# Patient Record
Sex: Male | Born: 1955 | Race: Black or African American | Hispanic: No | Marital: Married | Smoking: Former smoker
Health system: Southern US, Community
[De-identification: ages and names within clinical notes are randomized; demographics above are authoritative.]

## PROBLEM LIST (undated history)

## (undated) DIAGNOSIS — J209 Acute bronchitis, unspecified: Secondary | ICD-10-CM

## (undated) DIAGNOSIS — J441 Chronic obstructive pulmonary disease with (acute) exacerbation: Secondary | ICD-10-CM

## (undated) DIAGNOSIS — E119 Type 2 diabetes mellitus without complications: Secondary | ICD-10-CM

## (undated) DIAGNOSIS — J449 Chronic obstructive pulmonary disease, unspecified: Secondary | ICD-10-CM

## (undated) DIAGNOSIS — C61 Malignant neoplasm of prostate: Secondary | ICD-10-CM

## (undated) DIAGNOSIS — J440 Chronic obstructive pulmonary disease with acute lower respiratory infection: Secondary | ICD-10-CM

## (undated) DIAGNOSIS — D72829 Elevated white blood cell count, unspecified: Secondary | ICD-10-CM

## (undated) DIAGNOSIS — J9621 Acute and chronic respiratory failure with hypoxia: Secondary | ICD-10-CM

## (undated) DIAGNOSIS — J9601 Acute respiratory failure with hypoxia: Secondary | ICD-10-CM

## (undated) HISTORY — PX: TESTICLE SURGERY: SHX794

## (undated) HISTORY — DX: Acute respiratory failure with hypoxia: J96.01

## (undated) HISTORY — DX: Chronic obstructive pulmonary disease with (acute) exacerbation: J44.1

## (undated) HISTORY — DX: Acute and chronic respiratory failure with hypoxia: J96.21

## (undated) HISTORY — DX: Chronic obstructive pulmonary disease with (acute) lower respiratory infection: J44.0

## (undated) HISTORY — DX: Elevated white blood cell count, unspecified: D72.829

## (undated) HISTORY — DX: Acute bronchitis, unspecified: J20.9

---

## 2004-02-19 ENCOUNTER — Other Ambulatory Visit: Payer: Self-pay

## 2004-05-12 ENCOUNTER — Other Ambulatory Visit: Payer: Self-pay

## 2004-10-27 ENCOUNTER — Emergency Department: Payer: Self-pay | Admitting: Emergency Medicine

## 2004-11-16 ENCOUNTER — Emergency Department: Payer: Self-pay | Admitting: General Practice

## 2004-12-12 ENCOUNTER — Emergency Department: Payer: Self-pay | Admitting: General Practice

## 2004-12-22 ENCOUNTER — Emergency Department (HOSPITAL_COMMUNITY): Admission: EM | Admit: 2004-12-22 | Discharge: 2004-12-22 | Payer: Self-pay

## 2005-02-13 ENCOUNTER — Emergency Department: Payer: Self-pay | Admitting: Internal Medicine

## 2005-02-13 ENCOUNTER — Other Ambulatory Visit: Payer: Self-pay

## 2005-02-23 ENCOUNTER — Inpatient Hospital Stay: Payer: Self-pay | Admitting: Internal Medicine

## 2005-03-07 ENCOUNTER — Emergency Department: Payer: Self-pay | Admitting: Internal Medicine

## 2005-04-10 ENCOUNTER — Emergency Department: Payer: Self-pay | Admitting: Emergency Medicine

## 2005-05-08 ENCOUNTER — Emergency Department: Payer: Self-pay | Admitting: Emergency Medicine

## 2005-05-08 ENCOUNTER — Other Ambulatory Visit: Payer: Self-pay

## 2005-10-05 ENCOUNTER — Inpatient Hospital Stay: Payer: Self-pay | Admitting: Internal Medicine

## 2005-10-05 ENCOUNTER — Other Ambulatory Visit: Payer: Self-pay

## 2013-05-25 ENCOUNTER — Inpatient Hospital Stay: Payer: Self-pay | Admitting: Internal Medicine

## 2013-05-25 LAB — CBC
HCT: 46.6 % (ref 40.0–52.0)
HGB: 15.3 g/dL (ref 13.0–18.0)
MCH: 26.4 pg (ref 26.0–34.0)
MCHC: 32.8 g/dL (ref 32.0–36.0)
Platelet: 355 10*3/uL (ref 150–440)
RBC: 5.79 10*6/uL (ref 4.40–5.90)
RDW: 15.1 % — ABNORMAL HIGH (ref 11.5–14.5)
WBC: 11.3 10*3/uL — ABNORMAL HIGH (ref 3.8–10.6)

## 2013-05-25 LAB — COMPREHENSIVE METABOLIC PANEL
Albumin: 3.8 g/dL (ref 3.4–5.0)
Anion Gap: 11 (ref 7–16)
Calcium, Total: 9.3 mg/dL (ref 8.5–10.1)
Glucose: 105 mg/dL — ABNORMAL HIGH (ref 65–99)
Osmolality: 285 (ref 275–301)
Sodium: 142 mmol/L (ref 136–145)
Total Protein: 7.7 g/dL (ref 6.4–8.2)

## 2013-05-25 LAB — PRO B NATRIURETIC PEPTIDE: B-Type Natriuretic Peptide: 35 pg/mL (ref 0–125)

## 2013-05-25 LAB — CK TOTAL AND CKMB (NOT AT ARMC): CK-MB: 2 ng/mL (ref 0.5–3.6)

## 2015-04-03 NOTE — H&P (Signed)
PATIENT NAME:  Ernest [name], Ernest [name] MR#:  [id number] DATE OF BIRTH:  January 13, 1956  DATE OF ADMISSION:  05/25/2013  PRIMARY CARE PHYSICIAN: None. The patient is from out of town, [place], [place].   CHIEF COMPLAINT: Shortness of breath and cough for 3 days.   HISTORY OF PRESENT ILLNESS: Ernest [name] is a 59 year old African-American gentleman with history of COPD, ongoing heavy tobacco abuse, history of anxiety and hypertension, who is not currently on any medication due to financial constraints, who comes to the Emergency Room after he started having increasing cough with minimal sputum and shortness of breath. He came to the Emergency Room. He was very short of breath, was not able to complete a sentence, and was placed immediately on the BiPAP. During my evaluation, the patient appears to be more settled down, and his sat is 100% on current BiPAP setting. He is able to complete a sentence without any difficulty at this time. Denies any chest pain.   The patient says he recently relocated back from [place], [place], and is currently living in a hotel with his wife and 4 dogs. Denies any chest pain or any fever.   PAST MEDICAL HISTORY:  1. COPD. 2. Ongoing heavy tobacco abuse.  3. Anxiety.  4. History of hypertension.   CURRENT MEDICATIONS:  1. Symbicort inhaler.  2. Brovana nebulizer treatment.  3. He is supposed to be on clonazepam, but the patient has run out of it.  4. The patient says he also takes some antidepressants.   FAMILY HISTORY: CVA in father, who died when the patient was young. The patient's mother has breast cancer and diabetes.   SOCIAL HISTORY: He smokes about a pack a day. Recently relocated from [place], [place]. Does not drink alcohol. He is on disability.   REVIEW OF SYSTEMS:  CONSTITUTIONAL: No fever. Positive for fatigue, weakness.  EYES: No blurred or double vision.  ENT: No tinnitus, ear pain, hearing loss.  RESPIRATORY: Positive for shortness of breath and  cough along with COPD.  CARDIOVASCULAR: No chest pain, orthopnea or edema  GASTROINTESTINAL: No nausea, vomiting, diarrhea, abdominal pain or GERD.  GENITOURINARY: No dysuria or hematuria.  ENDOCRINE: No polyuria, nocturia or thyroid problems.  HEMATOLOGY: No anemia or easy bruising.  SKIN: No acne or rash.  MUSCULOSKELETAL: Positive for arthritis.  NEUROLOGIC: No CVA or TIAs.  PSYCHIATRIC: Positive for anxiety. No bipolar disorder or schizophrenia.   PHYSICAL EXAMINATION:  GENERAL: The patient is awake, alert, oriented x3.  VITAL SIGNS: He is afebrile, pulse is 100, blood pressure is 143/102. Repeat blood pressure is 133/77, pulse oximetry is 100%, currently being on BiPAP.  HEENT: Atraumatic. Bilateral pupils equal and reactive to light. The patient does have left eye conjunctival injection from excessive coughing. Oral mucosa is moist.  NECK: Supple. No JVD. No carotid bruit. No lymphadenopathy.  RESPIRATORY: There are distant breath sounds, decreased breath sounds at the bases. Mild rhonchi heard bilaterally.  CARDIOVASCULAR: Tachycardia present. Both the heart sounds are normal. Rhythm is regular, rate is tachycardic. No murmur heard. PMI not lateralized. Chest nontender. Good pedal pulses, good femoral pulses. No lower extremity edema.  ABDOMEN: Soft, benign, nontender. No organomegaly. Positive bowel sounds.  NEUROLOGIC: Grossly intact cranial nerves II through XII. No motor or sensory deficit.  PSYCHIATRIC: The patient is awake, alert, oriented x3.   DIAGNOSTIC STUDIES: EKG shows sinus tachycardia. Chest x-ray consistent with mild bilateral interstitial prominence. Pneumonitis or mild congestive heart failure cannot be excluded. B-type natriuretic peptide is 35. White count  is 11.3, H and H is 15.3 and 46.6, platelet count is 355. Glucose is 105, rest of the comprehensive metabolic panel within normal limits. Troponin is 0.02.   ASSESSMENT: The 59 year old [name] with history  of chronic obstructive pulmonary disease, ongoing tobacco abuse, history of anxiety, depression and  suspected hypertension, comes in with:   1. Acute respiratory failure secondary to severe chronic obstructive pulmonary disease exacerbation. The patient was kept on BiPAP during the ER stay. He was unable to complete a sentence without getting very short of breath. His saturations were 96% on room air on arrival with elevated blood pressure. The patient did settle down during my evaluation, remained on BiPAP with 100% saturations. Will admit the patient to the medical floor and continue IV Solu-Medrol around the clock along with give empirically Z-Pak, nebulizer treatments and oral inhalers.  2. Ongoing tobacco abuse, heavy. The patient was advised on smoking cessation. He is also recommended to take his inhalers on a routine basis. About 3 minutes spent for smoking cessation counseling.  3. Anxiety and depression. Will start the patient back on his clonazepam.   4. Deep vein thrombosis prophylaxis with subcutaneous heparin.  5. Suspected high blood pressure. Start the patient on antihypertensives if his average blood pressure stays on the higher side.  6. Further workup according to the patient's clinical course.   Hospital admission plan was discussed with the patient.   CRITICAL TIME SPENT: 55 minutes.   ____________________________ Hart Rochester Posey Pronto, MD sap:OSi D: 05/25/2013 13:54:44 ET T: 05/25/2013 14:14:06 ET JOB#: [id number]  cc: Shun Pletz A. Posey Pronto, MD, <Dictator> Ilda Basset MD ELECTRONICALLY SIGNED 05/25/2013 14:36

## 2015-04-03 NOTE — Discharge Summary (Signed)
PATIENT NAME:  Ernest [name], Ernest [name] MR#:  [id number] DATE OF BIRTH:  [birth year]  DATE OF ADMISSION:  05/25/2013  DATE OF DISCHARGE:  05/29/2013  PRIMARY CARE PHYSICIAN:  Non-local.  DISCHARGE DIAGNOSES: 1.  Acute respiratory failure.  2.  Chronic obstructive pulmonary disease exacerbation.  3.  Tobacco abuse.  4.  Anxiety.   CONDITION:  Stable.   CODE STATUS:  Full code.   MEDICATIONS:  Symbicort 160 mcg/4.5 mcg inhalation aerosol 2 puffs b.i.d. Nicotine patch   21 mg 24 hours transdermal 1 patch transdermal once a day. Spiriva 18 mcg capsule 1 cap once a day. Prednisone 40 mg p.o. daily, then taper. Combivent CFC free 100 mcg/200 mcg inhalation aerosol 1 puff 4 times a day p.r.n. for shortness of breath.   OXYGEN:  The patient needs home oxygen p.r.n. at night.   DIET:  Regular diet.   ACTIVITY:  As tolerated.   FOLLOWUP CARE:  Follow up with PCP within one week. Also patient wants pulmonary physician followup. He needs followup with Dr. Raul Del within 1 to 2 weeks.   REASON FOR ADMISSION:  Shortness of breath and cough for 3 days.   HOSPITAL COURSE: The patient is a 59 year old, African-American male with a history of COPD, ongoing tobacco abuse, hypertension. Presented to the ED with shortness of breath, cough for 3 days. The patient is currently not on any medication due to financial issue. The patient has just moved to this area several days ago. The patient was noted to have respiratory failure, was placed on BiPAP in the ED, and admitted for COPD exacerbation. For detailed history and physical examination, please refer to the admission note dictated by Dr. Fritzi Mandes.   1.  Acute respiratory failure with severe COPD exacerbation. The patient was on BiPAP for the first 2 days and then he has been treated with Solu-Medrol, nebulizer, Advair, Spiriva since admission. The patient was off BiPAP 2 days ago and placed on oxygen by nasal cannula, 3 to 4 liters. The patient's symptoms have much  improved since yesterday. He has only mild shortness of breath and cough. physical examination showed bilateral air entry. No wheezing or rales. No crackles. He is off oxygen since this morning, without shortness of breath. O2 saturation is 95% without oxygen.   2.  For tobacco abuse, patient was counseled for smoking cessation, was treated with a nicotine patch.   The patient's symptoms have much improved. He is clinically stable, will be discharged to home today. I discussed the patient's discharge plan with the patient, case manager and nurse.   TIME SPENT:  About 38 minutes.    ____________________________ Demetrios Loll, MD qc:mr D: 05/29/2013 14:08:00 ET T: 05/29/2013 20:18:52 ET JOB#: [id number]  cc: Demetrios Loll, MD, <Dictator> Demetrios Loll MD ELECTRONICALLY SIGNED 05/31/2013 13:47

## 2015-12-22 ENCOUNTER — Emergency Department
Admission: EM | Admit: 2015-12-22 | Discharge: 2015-12-22 | Disposition: A | Discharge status: Home / Self Care | Payer: Medicare PPO | Attending: Emergency Medicine | Admitting: Emergency Medicine

## 2015-12-22 ENCOUNTER — Encounter: Payer: Self-pay | Admitting: *Deleted

## 2015-12-22 DIAGNOSIS — F17200 Nicotine dependence, unspecified, uncomplicated: Secondary | ICD-10-CM | POA: Diagnosis not present

## 2015-12-22 DIAGNOSIS — L723 Sebaceous cyst: Secondary | ICD-10-CM | POA: Diagnosis not present

## 2015-12-22 DIAGNOSIS — L02212 Cutaneous abscess of back [any part, except buttock]: Secondary | ICD-10-CM | POA: Diagnosis present

## 2015-12-22 DIAGNOSIS — Z79899 Other long term (current) drug therapy: Secondary | ICD-10-CM | POA: Insufficient documentation

## 2015-12-22 DIAGNOSIS — L089 Local infection of the skin and subcutaneous tissue, unspecified: Secondary | ICD-10-CM

## 2015-12-22 HISTORY — DX: Chronic obstructive pulmonary disease, unspecified: J44.9

## 2015-12-22 MED ORDER — LIDOCAINE-EPINEPHRINE (PF) 1 %-1:200000 IJ SOLN
INTRAMUSCULAR | Status: AC
  Filled 2015-12-22: qty 30

## 2015-12-22 MED ORDER — SULFAMETHOXAZOLE-TRIMETHOPRIM 800-160 MG PO TABS: 2.0000 | ORAL_TABLET | Freq: Two times a day (BID) | ORAL | Status: AC

## 2015-12-22 MED ORDER — LIDOCAINE-EPINEPHRINE 2 %-1:100000 IJ SOLN
30.0000 mL | Freq: Once | INTRAMUSCULAR | Status: DC
  Filled 2015-12-22: qty 30

## 2015-12-22 NOTE — ED Notes (Signed)
Has large red raised area to upper back  States increased pain for the past 3 days

## 2015-12-22 NOTE — ED Notes (Signed)
Pt states abcsess to upper back, states noticed it 3 days ago

## 2015-12-22 NOTE — ED Provider Notes (Signed)
CSN: [id number]     Arrival date & time 12/22/15  1202 History   First MD Initiated Contact with Patient 12/22/15 1255     Chief Complaint  Patient presents with  . Abscess     HPI Comments: 60 year old male presents today complaining of abscess to upper back. Reports that he has had a cyst there for 8-9 months. About 3 days ago it started to become red and painful. No drainage from the area. No history of MRSA.   The history is provided by the patient.    Past Medical History  Diagnosis Date  . COPD (chronic obstructive pulmonary disease) ([place])    History reviewed. No pertinent past surgical history. History reviewed. No pertinent family history. Social History  Substance Use Topics  . Smoking status: Current Every Day Smoker  . Smokeless tobacco: None  . Alcohol Use: No    Review of Systems  Skin: Positive for wound.  All other systems reviewed and are negative.     Allergies  Review of patient's allergies indicates no known allergies.  Home Medications   Prior to Admission medications   Medication Sig Start Date End Date Taking? Authorizing Provider  albuterol (ACCUNEB) 1.25 MG/3ML nebulizer solution Take 1 ampule by nebulization every 6 (six) hours as needed for wheezing.   Yes Historical Provider, MD  albuterol (PROVENTIL HFA;VENTOLIN HFA) 108 (90 Base) MCG/ACT inhaler Inhale 2 puffs into the lungs every 6 (six) hours as needed for wheezing or shortness of breath.   Yes Historical Provider, MD  tiotropium (SPIRIVA) 18 MCG inhalation capsule Place 18 mcg into inhaler and inhale daily.   Yes Historical Provider, MD  sulfamethoxazole-trimethoprim (BACTRIM DS) 800-160 MG tablet Take 2 tablets by mouth 2 (two) times daily. 12/22/15 01/01/16  Angelica Ran V, PA-C   BP 142/92 mmHg  Pulse 81  Temp(Src) 98.2 F (36.8 C) (Oral)  Resp 18  Ht 6\' 3"  (1.905 m)  Wt 96.163 kg  BMI 26.50 kg/m2  SpO2 98% Physical Exam  Constitutional: He is oriented to person, place, and  time. He appears well-developed and well-nourished.  HENT:  Head: Normocephalic and atraumatic.  Neurological: He is alert and oriented to person, place, and time.  Skin: Skin is warm and dry. There is erythema.     Psychiatric: He has a normal mood and affect. His behavior is normal. Judgment and thought content normal.  Nursing note and vitals reviewed.   ED Course  .Marland KitchenIncision and Drainage Date/Time: 12/22/2015 4:06 PM Performed by: Harvest Dark Authorized by: Harvest Dark Consent: Verbal consent obtained. Written consent obtained. Risks and benefits: risks, benefits and alternatives were discussed Consent given by: patient Patient understanding: patient states understanding of the procedure being performed Patient consent: the patient's understanding of the procedure matches consent given Procedure consent: procedure consent matches procedure scheduled Relevant documents: relevant documents present and verified Test results: test results available and properly labeled Site marked: the operative site was marked Required items: required blood products, implants, devices, and special equipment available Patient identity confirmed: verbally with patient Time out: Immediately prior to procedure a "time out" was called to verify the correct patient, procedure, equipment, support staff and site/side marked as required. Type: cyst Body area: trunk Location details: back Anesthesia: local infiltration Local anesthetic: lidocaine 1% with epinephrine Anesthetic total: 4 ml Patient sedated: no Scalpel size: 11 Incision type: single straight Incision depth: dermal Complexity: simple Drainage: purulent and  serosanguinous Drainage amount: copious Wound treatment: drain placed  Packing material: 1/4 in iodoform gauze Patient tolerance: Patient tolerated the procedure well with no immediate complications   (including critical care time) Labs Review Labs Reviewed - No data to  display  Imaging Review No results found. I have personally reviewed and evaluated these images and lab results as part of my medical decision-making.   EKG Interpretation None      MDM  Bactrim DS x 10 days  Warm compresses to area Wash with soap and water Return in 2 days for wound check  Final diagnoses:  Infected sebaceous cyst      Harvest Dark, PA-C 12/22/15 1608  Harvest Dark, MD 12/23/15 1001

## 2015-12-22 NOTE — Discharge Instructions (Signed)
Sebaceous Cyst Removal Sebaceous cyst removal is a procedure to remove a sac of oily material that forms under your skin (sebaceous cyst). Sebaceous cysts may also be called epidermoid cysts or keratin cysts. Normally, the skin secretes this oily material through a gland or a hair follicle. This type of cyst usually results when a skin gland or hair follicle becomes blocked. You may need this procedure if you have a sebaceous cyst that becomes large, uncomfortable, or infected. LET YOUR HEALTH CARE PROVIDER KNOW ABOUT:  Any allergies you have.  All medicines you are taking, including vitamins, herbs, eye drops, creams, and over-the-counter medicines.  Previous problems you or members of your family have had with the use of anesthetics.  Any blood disorders you have.  Previous surgeries you have had.  Medical conditions you have. RISKS AND COMPLICATIONS Generally, this is a safe procedure. However, problems may occur, including:  Developing another cyst.  Bleeding.  Infection.  Scarring. BEFORE THE PROCEDURE  Ask your health care provider about:  Changing or stopping your regular medicines. This is especially important if you are taking diabetes medicines or blood thinners.  Taking medicines such as aspirin and ibuprofen. These medicines can thin your blood. Do not take these medicines before your procedure if your health care provider instructs you not to.  If you have an infected cyst, you may have to take antibiotic medicines before or after the cyst removal. Take your antibiotics as directed by your health care provider. Finish all of the medicine even if you start to feel better.  Take a shower on the morning of your procedure. Your health care provider may ask you to use a germ-killing (antiseptic) soap. PROCEDURE  You will be given a medicine that numbs the area (local anesthetic).  The skin around the cyst will be cleaned with a germ-killing solution  (antiseptic).  Your health care provider will make a small surgical incision over the cyst.  The cyst will be separated from the surrounding tissues that are under your skin.  If possible, the cyst will be removed undamaged (intact).  If the cyst bursts (ruptures), it will need to be removed in pieces.  After the cyst is removed, your health care provider will control any bleeding and close the incision with small stitches (sutures). Small incisions may not need sutures, and the bleeding will be controlled by applying direct pressure with gauze.  Your health care provider may apply antibiotic ointment and a light bandage (dressing) over the incision. This procedure may vary among health care providers and hospitals. AFTER THE PROCEDURE  If your cyst ruptured during surgery, you may need to take antibiotic medicine. If you were prescribed an antibiotic medicine, finish all of it even if you start to feel better.   This information is not intended to replace advice given to you by your health care provider. Make sure you discuss any questions you have with your health care provider.   Document Released: 11/25/2000 Document Revised: 12/19/2014 Document Reviewed: 08/13/2014 Elsevier Interactive Patient Education 2016 [employer]  

## 2015-12-24 ENCOUNTER — Emergency Department
Admission: EM | Admit: 2015-12-24 | Discharge: 2015-12-24 | Disposition: A | Discharge status: Home / Self Care | Payer: Medicare PPO | Attending: Emergency Medicine | Admitting: Emergency Medicine

## 2015-12-24 DIAGNOSIS — Z79899 Other long term (current) drug therapy: Secondary | ICD-10-CM | POA: Insufficient documentation

## 2015-12-24 DIAGNOSIS — Z4801 Encounter for change or removal of surgical wound dressing: Secondary | ICD-10-CM | POA: Insufficient documentation

## 2015-12-24 DIAGNOSIS — Z5189 Encounter for other specified aftercare: Secondary | ICD-10-CM

## 2015-12-24 DIAGNOSIS — J441 Chronic obstructive pulmonary disease with (acute) exacerbation: Secondary | ICD-10-CM | POA: Insufficient documentation

## 2015-12-24 DIAGNOSIS — F17200 Nicotine dependence, unspecified, uncomplicated: Secondary | ICD-10-CM | POA: Diagnosis not present

## 2015-12-24 DIAGNOSIS — Z7951 Long term (current) use of inhaled steroids: Secondary | ICD-10-CM | POA: Insufficient documentation

## 2015-12-24 DIAGNOSIS — J449 Chronic obstructive pulmonary disease, unspecified: Secondary | ICD-10-CM

## 2015-12-24 MED ORDER — IPRATROPIUM-ALBUTEROL 0.5-2.5 (3) MG/3ML IN SOLN
3.0000 mL | Freq: Once | RESPIRATORY_TRACT | Status: AC
  Administered 2015-12-24: 3 mL via RESPIRATORY_TRACT
  Filled 2015-12-24: qty 3

## 2015-12-24 MED ORDER — DEXAMETHASONE SODIUM PHOSPHATE 10 MG/ML IJ SOLN
20.0000 mg | Freq: Once | INTRAMUSCULAR | Status: AC
  Administered 2015-12-24: 20 mg via INTRAMUSCULAR
  Filled 2015-12-24: qty 2

## 2015-12-24 NOTE — Discharge Instructions (Signed)

## 2015-12-24 NOTE — ED Notes (Signed)
Assessment per PA 

## 2015-12-24 NOTE — ED Provider Notes (Signed)
[facility] Emergency Department Provider Note  ____________________________________________  Time seen: Approximately 10:38 AM  I have reviewed the triage vital signs and the nursing notes.   HISTORY  Chief Complaint Wound Check    HPI Ernest [name] is a 60 y.o. male who presents to the emergency department for a wound recheck for his abscess. Patient was seen in this department 2 days prior and incision and drainage was undertaken. She states that area is relatively pain-free, not growing in size, decreasing redness and swelling.  Talking with patient patient is in mild respiratory distress. Patient endorses a history of COPD. He states that he is on 3 nebulizer treatments and 3 inhalers for symptom control. He is recently ran out of his prescribed Symbicort. He states that medication is too expensive for him to obtain and he has stopped use. Patient endorses mild shortness of breath but states "I'm okay."   Past Medical History  Diagnosis Date  . COPD (chronic obstructive pulmonary disease) ([facility])     There are no active problems to display for this patient.   No past surgical history on file.  Current Outpatient Rx  Name  Route  Sig  Dispense  Refill  . albuterol (ACCUNEB) 1.25 MG/3ML nebulizer solution   Nebulization   Take 1 ampule by nebulization every 6 (six) hours as needed for wheezing.         Marland Kitchen albuterol (PROVENTIL HFA;VENTOLIN HFA) 108 (90 Base) MCG/ACT inhaler   Inhalation   Inhale 2 puffs into the lungs every 6 (six) hours as needed for wheezing or shortness of breath.         . sulfamethoxazole-trimethoprim (BACTRIM DS) 800-160 MG tablet   Oral   Take 2 tablets by mouth 2 (two) times daily.   40 tablet   0   . tiotropium (SPIRIVA) 18 MCG inhalation capsule   Inhalation   Place 18 mcg into inhaler and inhale daily.           Allergies Review of patient's allergies indicates no known allergies.  No family history on  file.  Social History Social History  Substance Use Topics  . Smoking status: Current Every Day Smoker  . Smokeless tobacco: Not on file  . Alcohol Use: No     Review of Systems  Constitutional: No fever/chills Eyes: No visual changes. No discharge ENT: No sore throat. Cardiovascular: no chest pain. Respiratory: no cough. Positive SOB. Gastrointestinal: No abdominal pain.  No nausea, no vomiting.  No diarrhea.  No constipation. Genitourinary: Negative for dysuria. No hematuria Musculoskeletal: Negative for back pain. Skin: Negative for rash. Positive for incised and drained abscess. Neurological: Negative for headaches, focal weakness or numbness. 10-point ROS otherwise negative.  ____________________________________________   PHYSICAL EXAM:  VITAL SIGNS: ED Triage Vitals  Enc Vitals Group     BP 12/24/15 1029 135/87 mmHg     Pulse Rate 12/24/15 1029 86     Resp 12/24/15 1029 20     Temp 12/24/15 1029 97.8 F (36.6 C)     Temp Source 12/24/15 1029 Oral     SpO2 12/24/15 1029 96 %     Weight --      Height --      Head Cir --      Peak Flow --      Pain Score --      Pain Loc --      Pain Edu? --      Excl. in [place]? --  Constitutional: Alert and oriented. Well appearing and in no acute distress. Eyes: Conjunctivae are normal. PERRL. EOMI. Head: Atraumatic. ENT:      Ears:       Nose: No congestion/rhinnorhea.      Mouth/Throat: Mucous membranes are moist.  Neck: No stridor.   Hematological/Lymphatic/Immunilogical: No cervical lymphadenopathy. Cardiovascular: Normal rate, regular rhythm. Normal S1 and S2.  Good peripheral circulation. Respiratory: Normal respiratory effort without tachypnea or retractions. Lungs with diffuse inspiratory and end expiratory wheezing. Slightly decreased breath sounds to bilateral bases. No absent breath sounds. No rales or rhonchi. Gastrointestinal: Soft and nontender. No distention. No CVA tenderness. Musculoskeletal: No  lower extremity tenderness nor edema.  No joint effusions. Neurologic:  Normal speech and language. No gross focal neurologic deficits are appreciated.  Skin:  Skin is warm, dry and intact. No rash noted. Packed abscess is identified to posterior thorax. Packing is in place with some mild pustular drainage/bloody mixture noted. Area is firm to touch. No fluctuance. Minimal erythema to area. Psychiatric: Mood and affect are normal. Speech and behavior are normal. Patient exhibits appropriate insight and judgement.   ____________________________________________   LABS (all labs ordered are listed, but only abnormal results are displayed)  Labs Reviewed - No data to display ____________________________________________  EKG   ____________________________________________  RADIOLOGY   No results found.  ____________________________________________    PROCEDURES  Procedure(s) performed:    The wound is cleansed, packing is removed and not replaced, and dressed. The patient is alerted to watch for any signs of infection (redness, pus, pain, increased swelling or fever) and call if such occurs. Home wound care instructions are provided.    Medications  ipratropium-albuterol (DUONEB) 0.5-2.5 (3) MG/3ML nebulizer solution 3 mL (3 mLs Nebulization Given 12/24/15 1056)  dexamethasone (DECADRON) injection 20 mg (20 mg Intramuscular Given 12/24/15 1056)     ____________________________________________   INITIAL IMPRESSION / ASSESSMENT AND PLAN / ED COURSE  Pertinent labs & imaging results that were available during my care of the patient were reviewed by me and considered in my medical decision making (see chart for details).  Patient's diagnosis is consistent with a wound recheck and COPD. Patient's abscess is healing appropriately. Packing is removed and not replaced. Area is dressed. Patient is advised to continue on his antibiotics and to keep the area covered and clean. He will  follow-up with the emergency department or primary care for any worsening or return of symptoms.  Patient has a history of significant COPD. He has recently ran out of home medications. Patient was in moderate respiratory distress here in the emergency department. Lungs were auscultated with significant wheezing. Mildly decreased air entry into the bases upon normal respirations. Patient was provided with DuoNeb treatment as well as Decadron injection here in the emergency department. After administration of both medications patient's symptoms improved. Lungs were re-auscultated and revealed an improvement in wheezing and improvement to air entry into the bases. Due to patient's improved clinical status he will be discharged home with no medications. Patient is given instructions on how to obtain manufacture coupon for his Symbicort and he will resume taking same.   Patient is to follow up with primary care if symptoms persist past this treatment course. Patient is given ED precautions to return to the ED for any worsening or new symptoms.     ____________________________________________  FINAL CLINICAL IMPRESSION(S) / ED DIAGNOSES  Final diagnoses:  Wound check, abscess  Chronic obstructive pulmonary disease, unspecified COPD type ([place])  NEW MEDICATIONS STARTED DURING THIS VISIT:  Discharge Medication List as of 12/24/2015 12:11 PM          Darletta Moll, PA-C 12/24/15 1234  Carrie Mew, MD 12/24/15 1537

## 2017-05-25 ENCOUNTER — Emergency Department: Payer: Medicare HMO

## 2017-05-25 ENCOUNTER — Inpatient Hospital Stay
Admission: EM | Admit: 2017-05-25 | Discharge: 2017-05-29 | DRG: 190 | Disposition: A | Discharge status: Home / Self Care | Payer: Medicare HMO | Attending: Internal Medicine | Admitting: Internal Medicine

## 2017-05-25 ENCOUNTER — Encounter: Payer: Self-pay | Admitting: Intensive Care

## 2017-05-25 DIAGNOSIS — R0602 Shortness of breath: Secondary | ICD-10-CM

## 2017-05-25 DIAGNOSIS — Z833 Family history of diabetes mellitus: Secondary | ICD-10-CM | POA: Diagnosis not present

## 2017-05-25 DIAGNOSIS — E119 Type 2 diabetes mellitus without complications: Secondary | ICD-10-CM | POA: Diagnosis present

## 2017-05-25 DIAGNOSIS — J209 Acute bronchitis, unspecified: Secondary | ICD-10-CM

## 2017-05-25 DIAGNOSIS — F17210 Nicotine dependence, cigarettes, uncomplicated: Secondary | ICD-10-CM | POA: Diagnosis present

## 2017-05-25 DIAGNOSIS — J9601 Acute respiratory failure with hypoxia: Secondary | ICD-10-CM | POA: Diagnosis present

## 2017-05-25 DIAGNOSIS — J440 Chronic obstructive pulmonary disease with acute lower respiratory infection: Secondary | ICD-10-CM

## 2017-05-25 DIAGNOSIS — Z9981 Dependence on supplemental oxygen: Secondary | ICD-10-CM | POA: Diagnosis not present

## 2017-05-25 DIAGNOSIS — J441 Chronic obstructive pulmonary disease with (acute) exacerbation: Secondary | ICD-10-CM

## 2017-05-25 DIAGNOSIS — J9621 Acute and chronic respiratory failure with hypoxia: Secondary | ICD-10-CM

## 2017-05-25 DIAGNOSIS — D72829 Elevated white blood cell count, unspecified: Secondary | ICD-10-CM

## 2017-05-25 DIAGNOSIS — R0603 Acute respiratory distress: Secondary | ICD-10-CM

## 2017-05-25 HISTORY — DX: Elevated white blood cell count, unspecified: D72.829

## 2017-05-25 HISTORY — DX: Chronic obstructive pulmonary disease with (acute) lower respiratory infection: J44.0

## 2017-05-25 HISTORY — DX: Acute bronchitis, unspecified: J20.9

## 2017-05-25 HISTORY — DX: Chronic obstructive pulmonary disease with (acute) exacerbation: J44.1

## 2017-05-25 HISTORY — DX: Acute respiratory failure with hypoxia: J96.01

## 2017-05-25 HISTORY — DX: Type 2 diabetes mellitus without complications: E11.9

## 2017-05-25 HISTORY — DX: Acute and chronic respiratory failure with hypoxia: J96.21

## 2017-05-25 LAB — BLOOD GAS, ARTERIAL
Acid-base deficit: 2.5 mmol/L — ABNORMAL HIGH (ref 0.0–2.0)
Allens test (pass/fail): POSITIVE — AB
Bicarbonate: 20.6 mmol/L (ref 20.0–28.0)
Delivery systems: POSITIVE
Expiratory PAP: 5
FIO2: 0.28
Inspiratory PAP: 12
O2 Saturation: 99.5 %
Patient temperature: 37
RATE: 8 resp/min
pCO2 arterial: 31 mmHg — ABNORMAL LOW (ref 32.0–48.0)
pH, Arterial: 7.43 (ref 7.350–7.450)
pO2, Arterial: 158 mmHg — ABNORMAL HIGH (ref 83.0–108.0)

## 2017-05-25 LAB — COMPREHENSIVE METABOLIC PANEL
ALT: 15 U/L — AB (ref 17–63)
AST: 27 U/L (ref 15–41)
Albumin: 4.2 g/dL (ref 3.5–5.0)
Alkaline Phosphatase: 96 U/L (ref 38–126)
Anion gap: 10 (ref 5–15)
BILIRUBIN TOTAL: 0.6 mg/dL (ref 0.3–1.2)
BUN: 19 mg/dL (ref 6–20)
CALCIUM: 9.6 mg/dL (ref 8.9–10.3)
CHLORIDE: 108 mmol/L (ref 101–111)
CO2: 21 mmol/L — ABNORMAL LOW (ref 22–32)
CREATININE: 1.05 mg/dL (ref 0.61–1.24)
GFR calc Af Amer: >60 mL/min (ref >60)
Glucose, Bld: 118 mg/dL — ABNORMAL HIGH (ref 65–99)
Potassium: 3.8 mmol/L (ref 3.5–5.1)
Sodium: 139 mmol/L (ref 135–145)
TOTAL PROTEIN: 7.9 g/dL (ref 6.5–8.1)

## 2017-05-25 LAB — CBC WITH DIFFERENTIAL/PLATELET
BASOS ABS: 0 10*3/uL (ref 0–0.1)
BASOS PCT: 0 %
EOS ABS: 0.2 10*3/uL (ref 0–0.7)
EOS PCT: 2 %
HCT: 45.5 % (ref 40.0–52.0)
Hemoglobin: 15 g/dL (ref 13.0–18.0)
LYMPHS PCT: 34 %
Lymphs Abs: 4.6 10*3/uL — ABNORMAL HIGH (ref 1.0–3.6)
MCH: 25.6 pg — ABNORMAL LOW (ref 26.0–34.0)
MCHC: 33 g/dL (ref 32.0–36.0)
MCV: 77.6 fL — ABNORMAL LOW (ref 80.0–100.0)
MONO ABS: 1.2 10*3/uL — AB (ref 0.2–1.0)
Monocytes Relative: 9 %
Neutro Abs: 7.7 10*3/uL — ABNORMAL HIGH (ref 1.4–6.5)
Neutrophils Relative %: 55 %
PLATELETS: 413 10*3/uL (ref 150–440)
RBC: 5.87 MIL/uL (ref 4.40–5.90)
RDW: 15.6 % — AB (ref 11.5–14.5)
WBC: 13.8 10*3/uL — AB (ref 3.8–10.6)

## 2017-05-25 LAB — BRAIN NATRIURETIC PEPTIDE: B NATRIURETIC PEPTIDE 5: 14 pg/mL (ref 0.0–100.0)

## 2017-05-25 LAB — TROPONIN I

## 2017-05-25 MED ORDER — IPRATROPIUM-ALBUTEROL 0.5-2.5 (3) MG/3ML IN SOLN
3.0000 mL | Freq: Once | RESPIRATORY_TRACT | Status: AC
  Administered 2017-05-25: 3 mL via RESPIRATORY_TRACT

## 2017-05-25 MED ORDER — NICOTINE 21 MG/24HR TD PT24
21.0000 mg | MEDICATED_PATCH | Freq: Every day | TRANSDERMAL | Status: DC
  Administered 2017-05-25 – 2017-05-29 (×5): 21 mg via TRANSDERMAL
  Filled 2017-05-25 (×5): qty 1

## 2017-05-25 MED ORDER — MAGNESIUM SULFATE 2 GM/50ML IV SOLN
2.0000 g | Freq: Once | INTRAVENOUS | Status: AC
  Administered 2017-05-25: 2 g via INTRAVENOUS
  Filled 2017-05-25: qty 50

## 2017-05-25 MED ORDER — ONDANSETRON HCL 4 MG/2ML IJ SOLN: 4.0000 mg | Freq: Four times a day (QID) | INTRAMUSCULAR | Status: DC | PRN

## 2017-05-25 MED ORDER — GUAIFENESIN ER 600 MG PO TB12
600.0000 mg | ORAL_TABLET | Freq: Two times a day (BID) | ORAL | Status: DC
  Administered 2017-05-25 – 2017-05-29 (×8): 600 mg via ORAL
  Filled 2017-05-25 (×8): qty 1

## 2017-05-25 MED ORDER — METHYLPREDNISOLONE SODIUM SUCC 125 MG IJ SOLR
60.0000 mg | Freq: Two times a day (BID) | INTRAMUSCULAR | Status: DC
  Administered 2017-05-25 – 2017-05-26 (×2): 60 mg via INTRAVENOUS
  Filled 2017-05-25 (×2): qty 2

## 2017-05-25 MED ORDER — DEXTROSE 5 % IV SOLN
500.0000 mg | INTRAVENOUS | Status: DC
  Administered 2017-05-26 – 2017-05-27 (×2): 500 mg via INTRAVENOUS
  Filled 2017-05-25 (×3): qty 500

## 2017-05-25 MED ORDER — TIOTROPIUM BROMIDE MONOHYDRATE 18 MCG IN CAPS
18.0000 ug | ORAL_CAPSULE | Freq: Every day | RESPIRATORY_TRACT | Status: DC
  Administered 2017-05-26: 18 ug via RESPIRATORY_TRACT
  Filled 2017-05-25: qty 5

## 2017-05-25 MED ORDER — IPRATROPIUM-ALBUTEROL 0.5-2.5 (3) MG/3ML IN SOLN
RESPIRATORY_TRACT | Status: AC
  Administered 2017-05-25: 3 mL via RESPIRATORY_TRACT
  Filled 2017-05-25: qty 9

## 2017-05-25 MED ORDER — METHYLPREDNISOLONE SODIUM SUCC 125 MG IJ SOLR
125.0000 mg | Freq: Once | INTRAMUSCULAR | Status: AC
  Administered 2017-05-25: 125 mg via INTRAVENOUS

## 2017-05-25 MED ORDER — DEXTROSE 5 % IV SOLN
500.0000 mg | Freq: Once | INTRAVENOUS | Status: AC
  Administered 2017-05-25: 500 mg via INTRAVENOUS
  Filled 2017-05-25: qty 500

## 2017-05-25 MED ORDER — GUAIFENESIN-DM 100-10 MG/5ML PO SYRP
5.0000 mL | ORAL_SOLUTION | ORAL | Status: DC | PRN
  Administered 2017-05-25 – 2017-05-26 (×2): 5 mL via ORAL
  Filled 2017-05-25 (×2): qty 5

## 2017-05-25 MED ORDER — BUDESONIDE 0.25 MG/2ML IN SUSP
0.2500 mg | Freq: Two times a day (BID) | RESPIRATORY_TRACT | Status: DC
  Administered 2017-05-26: 0.25 mg via RESPIRATORY_TRACT

## 2017-05-25 MED ORDER — ENOXAPARIN SODIUM 40 MG/0.4ML ~~LOC~~ SOLN
40.0000 mg | SUBCUTANEOUS | Status: DC
  Administered 2017-05-26 – 2017-05-28 (×3): 40 mg via SUBCUTANEOUS
  Filled 2017-05-25 (×3): qty 0.4

## 2017-05-25 MED ORDER — METHYLPREDNISOLONE SODIUM SUCC 125 MG IJ SOLR
INTRAMUSCULAR | Status: AC
  Administered 2017-05-25: 125 mg via INTRAVENOUS
  Filled 2017-05-25: qty 2

## 2017-05-25 MED ORDER — ALBUTEROL SULFATE (2.5 MG/3ML) 0.083% IN NEBU
2.5000 mg | INHALATION_SOLUTION | RESPIRATORY_TRACT | Status: DC
  Administered 2017-05-25 – 2017-05-26 (×4): 2.5 mg via RESPIRATORY_TRACT
  Filled 2017-05-25 (×3): qty 3

## 2017-05-25 MED ORDER — ONDANSETRON HCL 4 MG PO TABS: 4.0000 mg | ORAL_TABLET | Freq: Four times a day (QID) | ORAL | Status: DC | PRN

## 2017-05-25 NOTE — H&P (Addendum)
[place] [facility] NAME: Ernest [name]    MR#:  [id number]  DATE OF BIRTH:  November 09, 1956  DATE OF ADMISSION:  05/25/2017  PRIMARY CARE PHYSICIAN: Patient, No Pcp Per   REQUESTING/REFERRING PHYSICIAN:   CHIEF COMPLAINT:   Chief Complaint  Patient presents with  . Respiratory Distress    HISTORY OF PRESENT ILLNESS: Ernest [name]  is a 61 y.o. male with a known history of Ongoing tobacco abuse, COPD, diabetes, who presents to the hospital with complaints of significant shortness of breath. According to the patient, he was doing well up until approximately 1 week ago when he started having progressive shortness of breath, cough with clear phlegm, wheezing. He was so distressed, that required BiPAP administration. In emergency room. He was weaned off BiPAP at present, now on oxygen therapy, feeling somewhat better. Hospitalist services were contacted for admission  PAST MEDICAL HISTORY:   Past Medical History:  Diagnosis Date  . COPD (chronic obstructive pulmonary disease) ([place])   . Diabetes mellitus without complication ([place])     PAST SURGICAL HISTORY: History reviewed. No pertinent surgical history.  SOCIAL HISTORY:  Social History  Substance Use Topics  . Smoking status: Current Every Day Smoker    Packs/day: 0.50    Types: Cigarettes  . Smokeless tobacco: Never Used  . Alcohol use No    FAMILY HISTORY: Father had stroke, it better. Patient was a young, patient's mother had breast cancer and diabetes  DRUG ALLERGIES: No Known Allergies  Review of Systems  Constitutional: Negative for chills, fever and weight loss.  HENT: Negative for congestion.   Eyes: Negative for blurred vision and double vision.  Respiratory: Positive for cough, hemoptysis, sputum production, shortness of breath and wheezing.   Cardiovascular: Positive for chest pain. Negative for palpitations, orthopnea, leg swelling and PND.  Gastrointestinal:  Negative for abdominal pain, blood in stool, constipation, diarrhea, nausea and vomiting.  Genitourinary: Positive for frequency. Negative for dysuria, hematuria and urgency.  Musculoskeletal: Negative for falls.  Neurological: Negative for dizziness, tremors, focal weakness and headaches.  Endo/Heme/Allergies: Does not bruise/bleed easily.  Psychiatric/Behavioral: Negative for depression. The patient does not have insomnia.     MEDICATIONS AT HOME:  Prior to Admission medications   Medication Sig Start Date End Date Taking? Authorizing Provider  albuterol (PROVENTIL HFA;VENTOLIN HFA) 108 (90 Base) MCG/ACT inhaler Inhale 2 puffs into the lungs every 6 (six) hours as needed for wheezing or shortness of breath.   Yes [provider]  ipratropium-albuterol (DUONEB) 0.5-2.5 (3) MG/3ML SOLN Take 3 mLs by nebulization every 4 (four) hours as needed.   Yes [provider]      PHYSICAL EXAMINATION:   VITAL SIGNS: Blood pressure 124/89, pulse 87, temperature (!) 96.8 F (36 C), temperature source Axillary, resp. rate 14, height 6' (1.829 m), weight 96.2 kg (212 lb), SpO2 97 %.  GENERAL:  61 y.o.-year-old patient lying in the bed Moderate respiratory distress distress, intermittently gasping for air, tachypneic, uncomfortable.  EYES: Pupils equal, round, reactive to light and accommodation. No scleral icterus. Extraocular muscles intact.  HEENT: Head atraumatic, normocephalic. Oropharynx and nasopharynx clear.  NECK:  Supple, no jugular venous distention. No thyroid enlargement, no tenderness.  LUNGS: Some diminished breath sounds bilaterally, few scattered wheezes, no rales,rhonchi or crepitation. Using accessory muscles of respiration.  CARDIOVASCULAR: S1, S2 normal. No murmurs, rubs, or gallops.  ABDOMEN: Soft, nontender, nondistended. Bowel sounds present. No organomegaly or mass.  EXTREMITIES:  Trace ankle, pedal edema, no cyanosis, or clubbing.  NEUROLOGIC: Cranial nerves  II through XII are intact. Muscle strength 5/5 in all extremities. Sensation intact. Gait not checked.  PSYCHIATRIC: The patient is alert and oriented x 3.  SKIN: No obvious rash, lesion, or ulcer.   LABORATORY PANEL:   CBC  Recent Labs Lab 05/25/17 1845  WBC 13.8*  HGB 15.0  HCT 45.5  PLT 413  MCV 77.6*  MCH 25.6*  MCHC 33.0  RDW 15.6*  LYMPHSABS 4.6*  MONOABS 1.2*  EOSABS 0.2  BASOSABS 0.0   ------------------------------------------------------------------------------------------------------------------  Chemistries   Recent Labs Lab 05/25/17 1845  NA 139  K 3.8  CL 108  CO2 21*  GLUCOSE 118*  BUN 19  CREATININE 1.05  CALCIUM 9.6  AST 27  ALT 15*  ALKPHOS 96  BILITOT 0.6   ------------------------------------------------------------------------------------------------------------------  Cardiac Enzymes  Recent Labs Lab 05/25/17 1845  TROPONINI <0.03   ------------------------------------------------------------------------------------------------------------------  RADIOLOGY: Dg Chest Portable 1 View  Result Date: 05/25/2017 CLINICAL DATA:  Respiratory distress. EXAM: PORTABLE CHEST 1 VIEW COMPARISON:  Earlier the same day FINDINGS: 1843 hours. Lungs are hyperexpanded. Left base subsegmental atelectasis noted. No edema or focal airspace consolidation. No pleural effusion. The visualized bony structures of the thorax are intact. Telemetry leads overlie the chest. IMPRESSION: Hyperexpansion with left base atelectasis. Electronically Signed   By: Misty Stanley M.D.   On: 05/25/2017 20:01    EKG: Orders placed or performed during the hospital encounter of 05/25/17  . EKG 12-Lead  . EKG 12-Lead  . ED EKG  . ED EKG  EKG in emergency room reveals sinus tachycardia at rate of 121 bpm  IMPRESSION AND PLAN:  Active Problems:   Acute on chronic respiratory failure with hypoxia (HCC)   COPD with acute exacerbation (HCC)   COPD with acute bronchitis  (HCC)   Leukocytosis   Acute respiratory failure with hypoxia ([place])  #1. Acute respiratory failure with hypoxia, continue oxygen therapy, wean off oxygen as tolerated #2. COPD exacerbation, initiate patient on budesonide, albuterol, tiotropium, steroids  intravenously, follow clinically . #3. Bronchitis, initiate patient on Zithromax, get sputum cultures if possible #4. Leukocytosis, follow with therapy #5. Tobacco abuse. Counseling, discussed this patient for 4 minutes, nicotine replacement therapy will be initiated, he is agreeable  All the records are reviewed and case discussed with ED provider. Management plans discussed with the patient, family and they are in agreement.  CODE STATUS: Code Status History    This patient does not have a recorded code status. Please follow your organizational policy for patients in this situation.       TOTAL TIME TAKING CARE OF THIS PATIENT:50 minutes.    Theodoro Grist M.D on 05/25/2017 at 8:32 PM  Between 7am to 6pm - Pager - [phone] After 6pm go to [email or web address] - password [email or web address] [facility]  Office  [phone]  CC: Primary care physician; Patient, No Pcp Per

## 2017-05-25 NOTE — ED Notes (Signed)
Pharmacy called about PRN robitussin and states they will send it to the ED.

## 2017-05-25 NOTE — ED Triage Notes (Signed)
Patient arrived by POV in respiratory distress. Patient brought back straight to room with MD made aware. RT placing patient on bi-pap at this time. No relief with inhalers at home. HX Intubation. HX severe COPD. Everyday smoker. A&O x4.

## 2017-05-25 NOTE — ED Provider Notes (Signed)
[facility] Emergency Department Provider Note   ____________________________________________   I have reviewed the triage vital signs and the nursing notes.   HISTORY  Chief Complaint Respiratory Distress   History limited by: Not Limited   HPI Ernest [name] is a 61 y.o. male who presents to the emergency department today because of concerns for respiratory distress. Patient states he has history of COPD. He states that it started this afternoon. It is severe. He denies any associated chest pain. Has been productive of clear phlegm. Denies any fevers. States he has had to be intubated in the past.   Past Medical History:  Diagnosis Date  . COPD (chronic obstructive pulmonary disease) ([place])   . Diabetes mellitus without complication ([place])     There are no active problems to display for this patient.   History reviewed. No pertinent surgical history.  Prior to Admission medications   Medication Sig Start Date End Date Taking? Authorizing Provider  albuterol (ACCUNEB) 1.25 MG/3ML nebulizer solution Take 1 ampule by nebulization every 6 (six) hours as needed for wheezing.    [provider]  albuterol (PROVENTIL HFA;VENTOLIN HFA) 108 (90 Base) MCG/ACT inhaler Inhale 2 puffs into the lungs every 6 (six) hours as needed for wheezing or shortness of breath.    [provider]  tiotropium (SPIRIVA) 18 MCG inhalation capsule Place 18 mcg into inhaler and inhale daily.    [provider]    Allergies Patient has no known allergies.  History reviewed. No pertinent family history.  Social History Social History  Substance Use Topics  . Smoking status: Current Every Day Smoker    Packs/day: 0.50    Types: Cigarettes  . Smokeless tobacco: Never Used  . Alcohol use No    Review of Systems Constitutional: No fever/chills Eyes: No visual changes. ENT: No sore throat. Cardiovascular: Denies chest pain. Respiratory: Positive for  shortness of breath. Gastrointestinal: No abdominal pain.  No nausea, no vomiting.  No diarrhea.   Genitourinary: Negative for dysuria. Musculoskeletal: Negative for back pain. Skin: Negative for rash. Neurological: Negative for headaches, focal weakness or numbness.  ____________________________________________   PHYSICAL EXAM:  VITAL SIGNS: ED Triage Vitals  Enc Vitals Group     BP 05/25/17 1844 (!) 149/126     Pulse Rate 05/25/17 1844 (!) 123     Resp 05/25/17 1844 (!) 30     Temp 05/25/17 1857 (!) 96.8 F (36 C)     Temp Source 05/25/17 1857 Axillary     SpO2 05/25/17 1844 98 %     Weight 05/25/17 1845 212 lb (96.2 kg)     Height 05/25/17 1845 6' (1.829 m)   Constitutional: Awake and alert. Moderate respiratory distress.  Eyes: Conjunctivae are normal.  ENT   Head: Normocephalic and atraumatic.   Nose: No congestion/rhinnorhea.   Mouth/Throat: Mucous membranes are moist.   Neck: No stridor. Hematological/Lymphatic/Immunilogical: No cervical lymphadenopathy. Cardiovascular: Tachycardic, regular rhythm.  No murmurs, rubs, or gallops.  Respiratory: Increased respiratory effort. Poor air movement diffusely, with some expiratory wheezing.  Gastrointestinal: Soft and non tender. No rebound. No guarding.  Genitourinary: Deferred Musculoskeletal: Normal range of motion in all extremities. No lower extremity edema. Neurologic:  Normal speech and language. No gross focal neurologic deficits are appreciated.  Skin:  Skin is warm, dry and intact. No rash noted. Psychiatric: Mood and affect are normal. Speech and behavior are normal. Patient exhibits appropriate insight and judgment.  ____________________________________________    LABS (pertinent positives/negatives)  Labs Reviewed  BLOOD GAS, ARTERIAL - Abnormal; Notable for the following:       Result Value   pCO2 arterial 31 (*)    pO2, Arterial 158 (*)    Acid-base deficit 2.5 (*)    Allens test  (pass/fail) POSITIVE (*)    All other components within normal limits  COMPREHENSIVE METABOLIC PANEL - Abnormal; Notable for the following:    CO2 21 (*)    Glucose, Bld 118 (*)    ALT 15 (*)    All other components within normal limits  CBC WITH DIFFERENTIAL/PLATELET - Abnormal; Notable for the following:    WBC 13.8 (*)    MCV 77.6 (*)    MCH 25.6 (*)    RDW 15.6 (*)    Neutro Abs 7.7 (*)    Lymphs Abs 4.6 (*)    Monocytes Absolute 1.2 (*)    All other components within normal limits  BRAIN NATRIURETIC PEPTIDE  TROPONIN I     ____________________________________________   EKG  I, Nance Pear, attending physician, personally viewed and interpreted this EKG  EKG Time: 1841 Rate: 121 Rhythm: sinus tachycardia Axis: normal Intervals: qtc 453 QRS: narrow ST changes: no st elevation Impression: abnormal ekg   ____________________________________________    RADIOLOGY  CXR  IMPRESSION: Hyperexpansion with left base atelectasis.  I, Analeigh Aries, personally viewed and evaluated these images (plain radiographs) as part of my medical decision making, as well as reviewing the written report by the radiologist.  ____________________________________________   PROCEDURES  Procedures  CRITICAL CARE Performed by: Nance Pear   Total critical care time: 35 minutes  Critical care time was exclusive of separately billable procedures and treating other patients.  Critical care was necessary to treat or prevent imminent or life-threatening deterioration.  Critical care was time spent personally by me on the following activities: development of treatment plan with patient and/or surrogate as well as nursing, discussions with consultants, evaluation of patient's response to treatment, examination of patient, obtaining history from patient or surrogate, ordering and performing treatments and interventions, ordering and review of laboratory studies, ordering  and review of radiographic studies, pulse oximetry and re-evaluation of patient's condition.  ____________________________________________   INITIAL IMPRESSION / ASSESSMENT AND PLAN / ED COURSE  Pertinent labs & imaging results that were available during my care of the patient were reviewed by me and considered in my medical decision making (see chart for details).  Patient presented to the emergency department today in respiratory distress. On initial exam patient was in moderate to severe respiratory distress. He had poor air movement diffusely with some expiratory wheezing noted. Patient was placed on BiPAP shortly after arrival to the emergency department. He was given multiple DuoNeb treatments through this as well as Solu-Medrol magnesium. He did improve significantly on the BiPAP. Chest x-ray without any obvious edema or consolidation. The patient will be admitted to the hospitalist service for further workup and evaluation.  ____________________________________________   FINAL CLINICAL IMPRESSION(S) / ED DIAGNOSES  Final diagnoses:  Respiratory distress  COPD exacerbation ([place])     Note: This dictation was prepared with Dragon dictation. Any transcriptional errors that result from this process are unintentional     Nance Pear, MD 05/25/17 2014

## 2017-05-25 NOTE — ED Notes (Addendum)
Medication from pharmacy has arrived, will give to pt and transport pt upstairs.

## 2017-05-25 NOTE — ED Notes (Signed)
Pt coughing at this time, pt is A&O and is able to talk and answer questions, pt states "I am fine, it feels like nasal drainage went down my throat." This RN will check orders for cough medicine.

## 2017-05-25 NOTE — ED Notes (Addendum)
Per EDP, pt can be taken off Bipap and placed on 4L of nasal canula. Pt is currently at 96% on 4L and able to talk and answer questions. Pt is A&Ox4.

## 2017-05-25 NOTE — ED Notes (Signed)
Pt tolerating 4L nasal canula well at this time, pt's oxygen is 97% and pt denies SHOB. Pt able to eat a sandwich without difficulty as well at this time.

## 2017-05-26 LAB — GLUCOSE, CAPILLARY
GLUCOSE-CAPILLARY: 159 mg/dL — AB (ref 65–99)
Glucose-Capillary: 178 mg/dL — ABNORMAL HIGH (ref 65–99)
Glucose-Capillary: 149 mg/dL — ABNORMAL HIGH (ref 65–99)

## 2017-05-26 LAB — CBC
HEMATOCRIT: 45.2 % (ref 40.0–52.0)
Hemoglobin: 15.1 g/dL (ref 13.0–18.0)
MCH: 26.7 pg (ref 26.0–34.0)
MCHC: 33.4 g/dL (ref 32.0–36.0)
MCV: 79.7 fL — AB (ref 80.0–100.0)
PLATELETS: 353 10*3/uL (ref 150–440)
RBC: 5.67 MIL/uL (ref 4.40–5.90)
RDW: 15.7 % — AB (ref 11.5–14.5)
WBC: 11.7 10*3/uL — AB (ref 3.8–10.6)

## 2017-05-26 LAB — BASIC METABOLIC PANEL
Anion gap: 8 (ref 5–15)
BUN: 23 mg/dL — AB (ref 6–20)
CHLORIDE: 110 mmol/L (ref 101–111)
CO2: 21 mmol/L — ABNORMAL LOW (ref 22–32)
CREATININE: 1.01 mg/dL (ref 0.61–1.24)
Calcium: 9.1 mg/dL (ref 8.9–10.3)
Glucose, Bld: 210 mg/dL — ABNORMAL HIGH (ref 65–99)
POTASSIUM: 4.2 mmol/L (ref 3.5–5.1)
SODIUM: 139 mmol/L (ref 135–145)

## 2017-05-26 LAB — EXPECTORATED SPUTUM ASSESSMENT W REFEX TO RESP CULTURE

## 2017-05-26 LAB — EXPECTORATED SPUTUM ASSESSMENT W GRAM STAIN, RFLX TO RESP C

## 2017-05-26 MED ORDER — IBUPROFEN 400 MG PO TABS
800.0000 mg | ORAL_TABLET | Freq: Three times a day (TID) | ORAL | Status: DC | PRN
  Administered 2017-05-26: 800 mg via ORAL
  Filled 2017-05-26: qty 2

## 2017-05-26 MED ORDER — IPRATROPIUM-ALBUTEROL 0.5-2.5 (3) MG/3ML IN SOLN
3.0000 mL | Freq: Four times a day (QID) | RESPIRATORY_TRACT | Status: DC
  Administered 2017-05-26 – 2017-05-29 (×10): 3 mL via RESPIRATORY_TRACT
  Filled 2017-05-26 (×10): qty 3

## 2017-05-26 MED ORDER — METHYLPREDNISOLONE SODIUM SUCC 40 MG IJ SOLR
40.0000 mg | INTRAMUSCULAR | Status: DC
  Administered 2017-05-27: 40 mg via INTRAVENOUS
  Filled 2017-05-26: qty 1

## 2017-05-26 MED ORDER — CALCIUM CARBONATE ANTACID 500 MG PO CHEW
1.0000 | CHEWABLE_TABLET | Freq: Three times a day (TID) | ORAL | Status: DC | PRN
  Administered 2017-05-26: 200 mg via ORAL
  Filled 2017-05-26: qty 1

## 2017-05-26 MED ORDER — INSULIN ASPART 100 UNIT/ML ~~LOC~~ SOLN
0.0000 [IU] | Freq: Three times a day (TID) | SUBCUTANEOUS | Status: DC
  Administered 2017-05-26 – 2017-05-27 (×3): 2 [IU] via SUBCUTANEOUS
  Administered 2017-05-27 – 2017-05-28 (×2): 1 [IU] via SUBCUTANEOUS
  Administered 2017-05-28: 2 [IU] via SUBCUTANEOUS
  Administered 2017-05-28 – 2017-05-29 (×2): 1 [IU] via SUBCUTANEOUS
  Filled 2017-05-26 (×8): qty 1

## 2017-05-26 MED ORDER — BUDESONIDE 0.5 MG/2ML IN SUSP
0.5000 mg | Freq: Two times a day (BID) | RESPIRATORY_TRACT | Status: DC
  Administered 2017-05-26 – 2017-05-29 (×6): 0.5 mg via RESPIRATORY_TRACT
  Filled 2017-05-26 (×6): qty 2

## 2017-05-26 MED ORDER — ORAL CARE MOUTH RINSE
15.0000 mL | Freq: Two times a day (BID) | OROMUCOSAL | Status: DC
  Administered 2017-05-26 – 2017-05-29 (×7): 15 mL via OROMUCOSAL

## 2017-05-26 MED ORDER — INSULIN ASPART 100 UNIT/ML ~~LOC~~ SOLN: 0.0000 [IU] | Freq: Every day | SUBCUTANEOUS | Status: DC

## 2017-05-26 NOTE — Progress Notes (Signed)
Patient ID: Ernest [name], male   DOB: [birth year], 61 y.o.   MRN: [id number]  [facility] PROGRESS NOTE  Ernest [name] IRS:[id number] DOB: 05/12/1956 DOA: 05/25/2017 PCP: Patient, No Pcp Per  HPI/Subjective: Patient feeling okay. Stated he ran out of his DuoNeb 3 weeks ago. He is thinking about relocating here from [place]. He is here visiting his grandchildren. Still with cough and shortness of breath.  Objective: Vitals:   05/26/17 1100 05/26/17 1448  BP: 139/88 104/85  Pulse: (!) 110 (!) 107  Resp: (!) 22 20  Temp: 97.8 F (36.6 C) 98.3 F (36.8 C)    Filed Weights   05/25/17 1845 05/25/17 2255 05/26/17 0500  Weight: 96.2 kg (212 lb) 105 kg (231 lb 8 oz) 101.2 kg (223 lb)    ROS: Review of Systems  Constitutional: Negative for chills and fever.  Eyes: Negative for blurred vision.  Respiratory: Positive for cough, shortness of breath and wheezing.   Cardiovascular: Negative for chest pain.  Gastrointestinal: Negative for abdominal pain, constipation, diarrhea, nausea and vomiting.  Genitourinary: Negative for dysuria.  Musculoskeletal: Negative for joint pain.  Neurological: Negative for dizziness and headaches.   Exam: Physical Exam  Constitutional: He is oriented to person, place, and time.  HENT:  Nose: No mucosal edema.  Mouth/Throat: No oropharyngeal exudate or posterior oropharyngeal edema.  Eyes: Conjunctivae, EOM and lids are normal. Pupils are equal, round, and reactive to light.  Neck: No JVD present. Carotid bruit is not present. No edema present. No thyroid mass and no thyromegaly present.  Cardiovascular: S1 normal and S2 normal.  Exam reveals no gallop.   No murmur heard. Pulses:      Dorsalis pedis pulses are 2+ on the right side, and 2+ on the left side.  Respiratory: No respiratory distress. He has decreased breath sounds in the right middle field, the right lower field, the left middle field and the left lower field. He has wheezes in the right middle  field and the left lower field. He has rhonchi in the right lower field and the left lower field. He has no rales.  Patient with coughing after every deep breath  GI: Soft. Bowel sounds are normal. There is no tenderness.  Musculoskeletal:       Right ankle: He exhibits no swelling.       Left ankle: He exhibits no swelling.  Lymphadenopathy:    He has no cervical adenopathy.  Neurological: He is alert and oriented to person, place, and time. No cranial nerve deficit.  Skin: Skin is warm. No rash noted. Nails show no clubbing.  Psychiatric: He has a normal mood and affect.      Data Reviewed: Basic Metabolic Panel:  Recent Labs Lab 05/25/17 1845 05/26/17 0358  NA 139 139  K 3.8 4.2  CL 108 110  CO2 21* 21*  GLUCOSE 118* 210*  BUN 19 23*  CREATININE 1.05 1.01  CALCIUM 9.6 9.1   Liver Function Tests:  Recent Labs Lab 05/25/17 1845  AST 27  ALT 15*  ALKPHOS 96  BILITOT 0.6  PROT 7.9  ALBUMIN 4.2   CBC:  Recent Labs Lab 05/25/17 1845 05/26/17 0358  WBC 13.8* 11.7*  NEUTROABS 7.7*  --   HGB 15.0 15.1  HCT 45.5 45.2  MCV 77.6* 79.7*  PLT 413 353   Cardiac Enzymes:  Recent Labs Lab 05/25/17 1845  TROPONINI <0.03   BNP (last 3 results)  Recent Labs  05/25/17 1846  BNP 14.0  CBG:  Recent Labs Lab 05/26/17 0732  GLUCAP 178*    Recent Results (from the past 240 hour(s))  Culture, sputum-assessment     Status: None   Collection Time: 05/26/17  4:16 AM  Result Value Ref Range Status   Specimen Description SPUTUM  Final   Special Requests NONE  Final   Sputum evaluation   Final    Sputum specimen not acceptable for testing.  Please recollect.     Report Status 05/26/2017 FINAL  Final     Studies: Dg Chest Portable 1 View  Result Date: 05/25/2017 CLINICAL DATA:  Respiratory distress. EXAM: PORTABLE CHEST 1 VIEW COMPARISON:  Earlier the same day FINDINGS: 1843 hours. Lungs are hyperexpanded. Left base subsegmental atelectasis noted. No  edema or focal airspace consolidation. No pleural effusion. The visualized bony structures of the thorax are intact. Telemetry leads overlie the chest. IMPRESSION: Hyperexpansion with left base atelectasis. Electronically Signed   By: Misty Stanley M.D.   On: 05/25/2017 20:01    Scheduled Meds: . budesonide (PULMICORT) nebulizer solution  0.5 mg Nebulization BID  . enoxaparin (LOVENOX) injection  40 mg Subcutaneous Q24H  . guaiFENesin  600 mg Oral BID  . insulin aspart  0-5 Units Subcutaneous QHS  . insulin aspart  0-9 Units Subcutaneous TID WC  . ipratropium-albuterol  3 mL Nebulization Q6H  . mouth rinse  15 mL Mouth Rinse BID  . [START ON 05/27/2017] methylPREDNISolone (SOLU-MEDROL) injection  40 mg Intravenous BH-q7a  . nicotine  21 mg Transdermal Daily   Continuous Infusions: . azithromycin      Assessment/Plan:  1. Acute respiratory failure with hypoxia. Patient initially required BiPAP in the emergency room. Currently on nasal cannula. Patient states he wears oxygen at night and as needed during the day. Will check pulse ox with ambulation. 2. COPD exacerbation. Solu-Medrol IV daily. Budesonide and DuoNeb. 3. Type 2 diabetes mellitus placed on sliding scale and hemoglobin A1c ordered  Code Status:     Code Status Orders        Start     Ordered   05/25/17 2258  Full code  Continuous     05/25/17 2257    Code Status History    Date Active Date Inactive Code Status Order ID Comments User Context   This patient has a current code status but no historical code status.     Disposition Plan: Potentially home in the next day or so depending on clinical course  Antibiotics:  Zithromax  Time spent: 28 minutes  [place], [place]

## 2017-05-26 NOTE — Care Management Note (Addendum)
Case Management Note  Patient Details  Name: Ernest [name] MRN: [id number] Date of Birth: [birth year]  Subjective/Objective:                  Spoke with patient regarding discharge planning. He and his wife just moved back to [facility]- been gone [place] for 2 years. PCP is Ernest [name]- hasn't seen him in 2 years.  Patient wants to re-establish with Ernest [name]. He  uses Product/process development scientist for medications. Symbicort cost "is really killing him financially". Independent with daily activities- drives. Living currently with his wife at his son until they find another place to live. The wife has a son in [place] too that they sometimes stay with.   Action/Plan: Appointment made on 1230PM 06/09/17 Friday with Ernest [name].  RNCM spoke with Ernest [name] with [facility] pharmacy to assist patient with Spiriva medication costs.   Expected Discharge Date:                  Expected Discharge Plan:     In-House Referral:  PCP / [facility]  Discharge planning Services  CM Consult, Medication Assistance  Post Acute Care Choice:    Choice offered to:  Patient  DME Arranged:    DME Agency:     HH Arranged:    [place] Agency:     Status of Service:  Completed, signed off  If discussed at [employer] of Stay Meetings, dates discussed:    Additional Comments:  Ernest Garfinkel, RN 05/26/2017, 2:07 PM

## 2017-05-26 NOTE — Progress Notes (Signed)
Chaplain visited with patient while on rounds. Provided pastoral care and encouragement in planning for discharge and visiting with family.    05/26/17 1415  Clinical Encounter Type  Visited With Patient  Visit Type Initial;Spiritual support  Referral From Chaplain  Consult/Referral To [place] (Comment)

## 2017-05-26 NOTE — Progress Notes (Signed)
   05/26/17 5449  Clinical Encounter Type  Visited With Patient;Patient and family together;Health care provider  Visit Type Initial;Other (Comment) (Advance Directive)  Referral From Nurse  Consult/Referral To Chaplain  Spiritual Encounters  Spiritual Needs Emotional  Stress Factors  Patient Stress Factors Health changes  Family Stress Factors None identified  Advance Directives (For Healthcare)  Does Patient Have a Medical Advance Directive? No  Would patient like information on creating a medical advance directive? Yes (Inpatient - patient requests chaplain consult to create a medical advance directive) (Educated patient on Forensic scientist)   Chaplain visited with patient and educated him about Regulatory affairs officer and [place]. Chaplain left the forms needed for patient to review with family. Patient will contact Nurse/Chaplain when he decides who will have the power over him. Provided information, compassion, and emotional support.

## 2017-05-26 NOTE — Care Management Important Message (Signed)
Important Message  Patient Details  Name: Ernest [name] MRN: [id number] Date of Birth: November 05, 1956   Medicare Important Message Given:  Yes Signed IM notice given   Katrina Stack, RN 05/26/2017, 4:27 PM

## 2017-05-26 NOTE — Progress Notes (Signed)
Inpatient Diabetes Program Recommendations  AACE/ADA: New Consensus Statement on Inpatient Glycemic Control (2015)  Target Ranges:  Prepandial:   less than 140 mg/dL      Peak postprandial:   less than 180 mg/dL (1-2 hours)      Critically ill patients:  140 - 180 mg/dL  Results for AKI, ABALOS (MRN [id number]) as of 05/26/2017 09:36  Ref. Range 05/26/2017 07:32  Glucose-Capillary Latest Ref Range: 65 - 99 mg/dL 178 (H)    Results for JATAVIS, MALEK (MRN [id number]) as of 05/26/2017 09:36  Ref. Range 05/25/2017 18:45 05/26/2017 03:58  Glucose Latest Ref Range: 65 - 99 mg/dL 118 (H) 210 (H)    Review of Glycemic Control  Diabetes history: No Outpatient Diabetes medications: NA Current orders for Inpatient glycemic control: None  Inpatient Diabetes Program Recommendations: Correction (SSI): While inpatient and ordered steroids, please consider ordering CBGs with Novolog correction scale ACHS.  Thanks, Barnie Alderman, RN, MSN, CDE Diabetes Coordinator Inpatient Diabetes Program [phone] (Team Pager from 8am to 5pm)

## 2017-05-26 NOTE — Progress Notes (Signed)
Home medication sent to pharmacy in tamper proof bag.

## 2017-05-27 ENCOUNTER — Encounter: Payer: Self-pay | Admitting: Radiology

## 2017-05-27 ENCOUNTER — Inpatient Hospital Stay: Payer: Medicare HMO

## 2017-05-27 LAB — GLUCOSE, CAPILLARY
GLUCOSE-CAPILLARY: 148 mg/dL — AB (ref 65–99)
Glucose-Capillary: 122 mg/dL — ABNORMAL HIGH (ref 65–99)
Glucose-Capillary: 189 mg/dL — ABNORMAL HIGH (ref 65–99)
Glucose-Capillary: 185 mg/dL — ABNORMAL HIGH (ref 65–99)

## 2017-05-27 LAB — HIV ANTIBODY (ROUTINE TESTING W REFLEX): HIV SCREEN 4TH GENERATION: NONREACTIVE

## 2017-05-27 LAB — HEMOGLOBIN A1C
Hgb A1c MFr Bld: 6.6 % — ABNORMAL HIGH (ref 4.8–5.6)
Mean Plasma Glucose: 143 mg/dL

## 2017-05-27 MED ORDER — IOPAMIDOL (ISOVUE-370) INJECTION 76%
75.0000 mL | Freq: Once | INTRAVENOUS | Status: AC | PRN
  Administered 2017-05-27: 75 mL via INTRAVENOUS

## 2017-05-27 MED ORDER — ALPRAZOLAM 0.25 MG PO TABS
0.2500 mg | ORAL_TABLET | Freq: Three times a day (TID) | ORAL | Status: DC | PRN
  Administered 2017-05-27: 0.25 mg via ORAL
  Filled 2017-05-27: qty 1

## 2017-05-27 MED ORDER — OXYCODONE-ACETAMINOPHEN 5-325 MG PO TABS
1.0000 | ORAL_TABLET | Freq: Four times a day (QID) | ORAL | Status: DC | PRN
  Administered 2017-05-27 – 2017-05-28 (×3): 1 via ORAL
  Filled 2017-05-27 (×3): qty 1

## 2017-05-27 MED ORDER — METHYLPREDNISOLONE SODIUM SUCC 40 MG IJ SOLR
40.0000 mg | Freq: Four times a day (QID) | INTRAMUSCULAR | Status: DC
  Administered 2017-05-27 – 2017-05-29 (×8): 40 mg via INTRAVENOUS
  Filled 2017-05-27 (×8): qty 1

## 2017-05-27 MED ORDER — GUAIFENESIN-CODEINE 100-10 MG/5ML PO SOLN
10.0000 mL | Freq: Four times a day (QID) | ORAL | Status: DC | PRN
  Administered 2017-05-27: 10 mL via ORAL
  Filled 2017-05-27: qty 10

## 2017-05-27 NOTE — Plan of Care (Signed)
Problem: Activity: Goal: Risk for activity intolerance will decrease Outcome: Not Progressing Patient continues to have significant dyspnea with minimal exertion

## 2017-05-27 NOTE — Progress Notes (Signed)
Patient ID: Ernest [name], male   DOB: 1956-01-15, 61 y.o.   MRN: [id number]  [facility] PROGRESS NOTE  Ernest [name] CZY:[id number] DOB: Dec 18, 1955 DOA: 05/25/2017 PCP: Patient, No Pcp Per  HPI/Subjective:  Patient continues to require oxygen. He is was doing at [place] until I started examining him when he started having wheezing and coughing    Objective: Vitals:   05/27/17 0601 05/27/17 1106  BP: (!) 141/89 123/79  Pulse: 84 90  Resp: (!) 22 18  Temp: 97.7 F (36.5 C) 98 F (36.7 C)    Filed Weights   05/25/17 2255 05/26/17 0500 05/27/17 0601  Weight: 231 lb 8 oz (105 kg) 223 lb (101.2 kg) 225 lb 4.8 oz (102.2 kg)    ROS: Review of Systems  Constitutional: Negative for chills and fever.  Eyes: Negative for blurred vision.  Respiratory: Positive for cough, shortness of breath and wheezing.   Cardiovascular: Negative for chest pain.  Gastrointestinal: Negative for abdominal pain, constipation, diarrhea, nausea and vomiting.  Genitourinary: Negative for dysuria.  Musculoskeletal: Negative for joint pain.  Neurological: Negative for dizziness and headaches.   Exam: Physical Exam  Constitutional: He is oriented to person, place, and time.  HENT:  Nose: No mucosal edema.  Mouth/Throat: No oropharyngeal exudate or posterior oropharyngeal edema.  Eyes: Conjunctivae, EOM and lids are normal. Pupils are equal, round, and reactive to light.  Neck: No JVD present. Carotid bruit is not present. No edema present. No thyroid mass and no thyromegaly present.  Cardiovascular: S1 normal and S2 normal.  Exam reveals no gallop.   No murmur heard. Pulses:      Dorsalis pedis pulses are 2+ on the right side, and 2+ on the left side.  Respiratory: No respiratory distress. He has decreased breath sounds in the right middle field, the right lower field, the left middle field and the left lower field. He has wheezes in the right middle field and the left lower field. He has rhonchi in the  right lower field and the left lower field. He has no rales.  Patient with coughing after every deep breath  GI: Soft. Bowel sounds are normal. There is no tenderness.  Musculoskeletal:       Right ankle: He exhibits no swelling.       Left ankle: He exhibits no swelling.  Lymphadenopathy:    He has no cervical adenopathy.  Neurological: He is alert and oriented to person, place, and time. No cranial nerve deficit.  Skin: Skin is warm. No rash noted. Nails show no clubbing.  Psychiatric: He has a normal mood and affect.      Data Reviewed: Basic Metabolic Panel:  Recent Labs Lab 05/25/17 1845 05/26/17 0358  NA 139 139  K 3.8 4.2  CL 108 110  CO2 21* 21*  GLUCOSE 118* 210*  BUN 19 23*  CREATININE 1.05 1.01  CALCIUM 9.6 9.1   Liver Function Tests:  Recent Labs Lab 05/25/17 1845  AST 27  ALT 15*  ALKPHOS 96  BILITOT 0.6  PROT 7.9  ALBUMIN 4.2   CBC:  Recent Labs Lab 05/25/17 1845 05/26/17 0358  WBC 13.8* 11.7*  NEUTROABS 7.7*  --   HGB 15.0 15.1  HCT 45.5 45.2  MCV 77.6* 79.7*  PLT 413 353   Cardiac Enzymes:  Recent Labs Lab 05/25/17 1845  TROPONINI <0.03   BNP (last 3 results)  Recent Labs  05/25/17 1846  BNP 14.0     CBG:  Recent Labs Lab  05/26/17 0732 05/26/17 1754 05/26/17 2127 05/27/17 0731 05/27/17 1141  GLUCAP 178* 159* 149* 122* 189*    Recent Results (from the past 240 hour(s))  Culture, sputum-assessment     Status: None   Collection Time: 05/26/17  4:16 AM  Result Value Ref Range Status   Specimen Description SPUTUM  Final   Special Requests NONE  Final   Sputum evaluation   Final    Sputum specimen not acceptable for testing.  Please recollect.     Report Status 05/26/2017 FINAL  Final     Studies: Dg Chest Portable 1 View  Result Date: 05/25/2017 CLINICAL DATA:  Respiratory distress. EXAM: PORTABLE CHEST 1 VIEW COMPARISON:  Earlier the same day FINDINGS: 1843 hours. Lungs are hyperexpanded. Left base  subsegmental atelectasis noted. No edema or focal airspace consolidation. No pleural effusion. The visualized bony structures of the thorax are intact. Telemetry leads overlie the chest. IMPRESSION: Hyperexpansion with left base atelectasis. Electronically Signed   By: Misty Stanley M.D.   On: 05/25/2017 20:01    Scheduled Meds: . budesonide (PULMICORT) nebulizer solution  0.5 mg Nebulization BID  . enoxaparin (LOVENOX) injection  40 mg Subcutaneous Q24H  . guaiFENesin  600 mg Oral BID  . insulin aspart  0-5 Units Subcutaneous QHS  . insulin aspart  0-9 Units Subcutaneous TID WC  . ipratropium-albuterol  3 mL Nebulization Q6H  . mouth rinse  15 mL Mouth Rinse BID  . methylPREDNISolone (SOLU-MEDROL) injection  40 mg Intravenous Q6H  . nicotine  21 mg Transdermal Daily   Continuous Infusions: . azithromycin Stopped (05/26/17 1903)    Assessment/Plan:  1. Acute respiratory failure with hypoxia. Patient initially required BiPAP in the emergency room. Currently on nasal cannula.Suspect we'll need oxygen at discharge  2. COPD exacerbation. Solu-Medrol IV daily. Budesonide and DuoNeb. still very symptomatic: Obtain a CT scan of the chest  3. Type 2 diabetes mellitus  hemoglobin A1c 6.6 continue sliding scale insuline 4. Misc: lovenox for dvt proph  Code Status:     Code Status Orders        Start     Ordered   05/25/17 2258  Full code  Continuous     05/25/17 2257    Code Status History    Date Active Date Inactive Code Status Order ID Comments User Context   This patient has a current code status but no historical code status.     Disposition Plan: Potentially home in the next day or so depending on clinical course  Antibiotics:  Zithromax  Time spent: 28 minutes  [place], [facility]

## 2017-05-27 NOTE — Progress Notes (Signed)
Central telemetry called; telemetry order expired. MD; Dr. Jannifer Franklin notified, renewed order. Jessee Avers

## 2017-05-28 LAB — GLUCOSE, CAPILLARY
GLUCOSE-CAPILLARY: 128 mg/dL — AB (ref 65–99)
GLUCOSE-CAPILLARY: 188 mg/dL — AB (ref 65–99)
Glucose-Capillary: 128 mg/dL — ABNORMAL HIGH (ref 65–99)
Glucose-Capillary: 165 mg/dL — ABNORMAL HIGH (ref 65–99)

## 2017-05-28 MED ORDER — AZITHROMYCIN 250 MG PO TABS
500.0000 mg | ORAL_TABLET | Freq: Every day | ORAL | Status: DC
  Administered 2017-05-28: 500 mg via ORAL
  Filled 2017-05-28: qty 2

## 2017-05-28 NOTE — Progress Notes (Signed)
Patient ID: Ernest [name], male   DOB: June 07, 1956, 61 y.o.   MRN: [id number]  [facility] PROGRESS NOTE  Ernest [name] OEU:[id number] DOB: 1956/04/28 DOA: 05/25/2017 PCP: Patient, No Pcp Per  HPI/Subjective:  Patient continues to complain of shortness of breath and coughing   Objective: Vitals:   05/28/17 0618 05/28/17 0732  BP: 121/65 (!) 115/54  Pulse: 60 71  Resp: 18 (!) 24  Temp: 97.6 F (36.4 C) 97.5 F (36.4 C)    Filed Weights   05/26/17 0500 05/27/17 0601 05/28/17 0500  Weight: 223 lb (101.2 kg) 225 lb 4.8 oz (102.2 kg) 225 lb 4 oz (102.2 kg)    ROS: Review of Systems  Constitutional: Negative for chills and fever.  Eyes: Negative for blurred vision.  Respiratory: Positive for cough, shortness of breath and wheezing.   Cardiovascular: Negative for chest pain.  Gastrointestinal: Negative for abdominal pain, constipation, diarrhea, nausea and vomiting.  Genitourinary: Negative for dysuria.  Musculoskeletal: Negative for joint pain.  Neurological: Negative for dizziness and headaches.   Exam: Physical Exam  Constitutional: He is oriented to person, place, and time.  HENT:  Nose: No mucosal edema.  Mouth/Throat: No oropharyngeal exudate or posterior oropharyngeal edema.  Eyes: Conjunctivae, EOM and lids are normal. Pupils are equal, round, and reactive to light.  Neck: No JVD present. Carotid bruit is not present. No edema present. No thyroid mass and no thyromegaly present.  Cardiovascular: S1 normal and S2 normal.  Exam reveals no gallop.   No murmur heard. Pulses:      Dorsalis pedis pulses are 2+ on the right side, and 2+ on the left side.  Respiratory: No respiratory distress. He has decreased breath sounds in the right middle field, the right lower field, the left middle field and the left lower field. He has wheezes in the right middle field and the left lower field. He has rhonchi in the right lower field and the left lower field. He has no rales.  Patient  with coughing after every deep breath  GI: Soft. Bowel sounds are normal. There is no tenderness.  Musculoskeletal:       Right ankle: He exhibits no swelling.       Left ankle: He exhibits no swelling.  Lymphadenopathy:    He has no cervical adenopathy.  Neurological: He is alert and oriented to person, place, and time. No cranial nerve deficit.  Skin: Skin is warm. No rash noted. Nails show no clubbing.  Psychiatric: He has a normal mood and affect.      Data Reviewed: Basic Metabolic Panel:  Recent Labs Lab 05/25/17 1845 05/26/17 0358  NA 139 139  K 3.8 4.2  CL 108 110  CO2 21* 21*  GLUCOSE 118* 210*  BUN 19 23*  CREATININE 1.05 1.01  CALCIUM 9.6 9.1   Liver Function Tests:  Recent Labs Lab 05/25/17 1845  AST 27  ALT 15*  ALKPHOS 96  BILITOT 0.6  PROT 7.9  ALBUMIN 4.2   CBC:  Recent Labs Lab 05/25/17 1845 05/26/17 0358  WBC 13.8* 11.7*  NEUTROABS 7.7*  --   HGB 15.0 15.1  HCT 45.5 45.2  MCV 77.6* 79.7*  PLT 413 353   Cardiac Enzymes:  Recent Labs Lab 05/25/17 1845  TROPONINI <0.03   BNP (last 3 results)  Recent Labs  05/25/17 1846  BNP 14.0     CBG:  Recent Labs Lab 05/27/17 1141 05/27/17 1634 05/27/17 2045 05/28/17 0736 05/28/17 1159  GLUCAP 189* 185*  148* 128* 128*    Recent Results (from the past 240 hour(s))  Culture, sputum-assessment     Status: None   Collection Time: 05/26/17  4:16 AM  Result Value Ref Range Status   Specimen Description SPUTUM  Final   Special Requests NONE  Final   Sputum evaluation   Final    Sputum specimen not acceptable for testing.  Please recollect.     Report Status 05/26/2017 FINAL  Final     Studies: Ct Angio Chest Pe W Or Wo Contrast  Result Date: 05/27/2017 CLINICAL DATA:  Cough, wheezing and shortness of breath. EXAM: CT ANGIOGRAPHY CHEST WITH CONTRAST TECHNIQUE: Multidetector CT imaging of the chest was performed using the standard protocol during bolus administration of  intravenous contrast. Multiplanar CT image reconstructions and MIPs were obtained to evaluate the vascular anatomy. CONTRAST:  75 cc of Isovue 370 COMPARISON:  None FINDINGS: Cardiovascular: Satisfactory opacification of the pulmonary arteries to the segmental level. No evidence of pulmonary embolism. Normal heart size. No pericardial effusion. Mediastinum/Nodes: The trachea appears patent and is midline. Normal appearance of the esophagus. No enlarged mediastinal or hilar lymph nodes identified. No axillary or supraclavicular adenopathy. Lungs/Pleura: No pleural effusion. There is subsegmental atelectasis noted within both lower lobes and within the lingula. No airspace consolidation identified. Mild changes of centrilobular emphysema peer Upper Abdomen: Stones identified within the gallbladder. No acute abnormality noted. Musculoskeletal: No aggressive lytic or sclerotic bone lesions. Review of the MIP images confirms the above findings. IMPRESSION: 1. No evidence for acute pulmonary embolus. 2. Lingula and bilateral lower lobe subsegmental atelectasis. Electronically Signed   By: Kerby Moors M.D.   On: 05/27/2017 14:34    Scheduled Meds: . azithromycin  500 mg Oral q1800  . budesonide (PULMICORT) nebulizer solution  0.5 mg Nebulization BID  . enoxaparin (LOVENOX) injection  40 mg Subcutaneous Q24H  . guaiFENesin  600 mg Oral BID  . insulin aspart  0-5 Units Subcutaneous QHS  . insulin aspart  0-9 Units Subcutaneous TID WC  . ipratropium-albuterol  3 mL Nebulization Q6H  . mouth rinse  15 mL Mouth Rinse BID  . methylPREDNISolone (SOLU-MEDROL) injection  40 mg Intravenous Q6H  . nicotine  21 mg Transdermal Daily   Continuous Infusions:   Assessment/Plan:  1. Acute respiratory failure with hypoxia. Continue oxygen therapy Slow to improve 2. COPD exacerbation. Solu-Medrol IV daily. Budesonide and DuoNeb. still very symptomatic: CT scan of the chest shows no pulmonary embolism no  pneumonia 3. Type 2 diabetes mellitus  hemoglobin A1c 6.6 continue sliding scale insuline 4. Misc: lovenox for dvt proph  Code Status:     Code Status Orders        Start     Ordered   05/25/17 2258  Full code  Continuous     05/25/17 2257    Code Status History    Date Active Date Inactive Code Status Order ID Comments User Context   This patient has a current code status but no historical code status.     Disposition Plan: Potentially home in the next day or so depending on clinical course  Antibiotics:  Zithromax  Time spent: 28 minutes  [place], [facility]

## 2017-05-28 NOTE — Progress Notes (Signed)
PHARMACIST - PHYSICIAN COMMUNICATION  CONCERNING: Antibiotic IV to Oral Route Change Policy  RECOMMENDATION: This patient is receiving azithromycin by the intravenous route.  Based on criteria approved by the Pharmacy and Therapeutics Committee, the antibiotic(s) is/are being converted to the equivalent oral dose form(s).   DESCRIPTION: These criteria include:  Patient being treated for a respiratory tract infection, urinary tract infection, cellulitis or clostridium difficile associated diarrhea if on metronidazole  The patient is not neutropenic and does not exhibit a GI malabsorption state  The patient is eating (either orally or via tube) and/or has been taking other orally administered medications for a least 24 hours  The patient is improving clinically and has a Tmax < 100.5

## 2017-05-28 NOTE — Progress Notes (Signed)
Pt. Slept well throughout the night with c/o pain x1, medicated, with somewhat effective results, pt. Has chronic pain. No SOB or acute distress observed. Continues on [facility].

## 2017-05-29 LAB — GLUCOSE, CAPILLARY
Glucose-Capillary: 149 mg/dL — ABNORMAL HIGH (ref 65–99)
Glucose-Capillary: 160 mg/dL — ABNORMAL HIGH (ref 65–99)

## 2017-05-29 MED ORDER — TIOTROPIUM BROMIDE MONOHYDRATE 18 MCG IN CAPS
18.0000 ug | ORAL_CAPSULE | Freq: Every day | RESPIRATORY_TRACT | Status: DC
  Administered 2017-05-29: 18 ug via RESPIRATORY_TRACT
  Filled 2017-05-29: qty 5

## 2017-05-29 MED ORDER — PREDNISONE 10 MG (21) PO TBPK: ORAL_TABLET | ORAL | 0 refills | Status: DC

## 2017-05-29 MED ORDER — AZITHROMYCIN 250 MG PO TABS: ORAL_TABLET | ORAL | 0 refills | Status: DC

## 2017-05-29 MED ORDER — TIOTROPIUM BROMIDE MONOHYDRATE 18 MCG IN CAPS: 18.0000 ug | ORAL_CAPSULE | Freq: Every day | RESPIRATORY_TRACT | Status: DC

## 2017-05-29 MED ORDER — TIOTROPIUM BROMIDE MONOHYDRATE 18 MCG IN CAPS: 18.0000 ug | ORAL_CAPSULE | Freq: Every day | RESPIRATORY_TRACT | 12 refills | Status: DC

## 2017-05-29 MED ORDER — BUDESONIDE-FORMOTEROL FUMARATE 160-4.5 MCG/ACT IN AERO: 2.0000 | INHALATION_SPRAY | Freq: Two times a day (BID) | RESPIRATORY_TRACT | 12 refills | Status: AC

## 2017-05-29 MED ORDER — IPRATROPIUM-ALBUTEROL 0.5-2.5 (3) MG/3ML IN SOLN: 3.0000 mL | Freq: Four times a day (QID) | RESPIRATORY_TRACT | 3 refills | Status: DC | PRN

## 2017-05-29 NOTE — Discharge Summary (Signed)
[facility] - [place] at [facility], 61 y.o., DOB 08-11-1956, MRN [id number]. Admission date: 05/25/2017 Discharge Date 05/29/2017 Primary MD Patient, No Pcp Per Admitting Physician Theodoro Grist, MD  Admission Diagnosis  Respiratory distress [R06.03] COPD exacerbation ([place]) [J44.1] COPD with acute exacerbation ([place]) [J44.1]  Discharge Diagnosis   Active Problems:   Acute on chronic respiratory failure with hypoxia ([facility])   COPD with acute exacerbation (H[facility])   COPD with acute bronchitis (HCC)   Leukocytosis   Acute respiratory failure with hypoxia (H[facility])  Diabetes type 2        Hospital Course:  Jaeshaun Riva  is a 61 y.o. male with a known history of Ongoing tobacco abuse, COPD, diabetes, who presents to the hospital with complaints of significant shortness of breath. According to the patient, he was doing well up until approximately 1 week ago when he started having progressive shortness of breath, cough with clear phlegm, wheezing. He was so distressed, that required BiPAP administration. In emergency room. He was weaned off BiPAP at present, now on oxygen therapy, feeling somewhat better. Hospitalist services were contacted for admission. Patient was continued on therapy with nebulizers steroids and inhalers. With improvement in his symptoms. He continued to complain of shortness of breath he underwent a CT scan of the chest which showed no pneumonia and no pulmonary embolism or lung mass.  Patient feeling much better currently on room air. Prior to admission patient reported that he was on oxygen at nighttime and as needed however he has not qualified for oxygen here. He is recommended to obtain the tank from [place] that he left.  Patient will have outpatient referral to pulmonary he'll need pulmonary function test.          Consults  None  Significant Tests:  See full reports for all details     Ct Angio Chest Pe W Or Wo Contrast  Result  Date: 05/27/2017 CLINICAL DATA:  Cough, wheezing and shortness of breath. EXAM: CT ANGIOGRAPHY CHEST WITH CONTRAST TECHNIQUE: Multidetector CT imaging of the chest was performed using the standard protocol during bolus administration of intravenous contrast. Multiplanar CT image reconstructions and MIPs were obtained to evaluate the vascular anatomy. CONTRAST:  75 cc of Isovue 370 COMPARISON:  None FINDINGS: Cardiovascular: Satisfactory opacification of the pulmonary arteries to the segmental level. No evidence of pulmonary embolism. Normal heart size. No pericardial effusion. Mediastinum/Nodes: The trachea appears patent and is midline. Normal appearance of the esophagus. No enlarged mediastinal or hilar lymph nodes identified. No axillary or supraclavicular adenopathy. Lungs/Pleura: No pleural effusion. There is subsegmental atelectasis noted within both lower lobes and within the lingula. No airspace consolidation identified. Mild changes of centrilobular emphysema peer Upper Abdomen: Stones identified within the gallbladder. No acute abnormality noted. Musculoskeletal: No aggressive lytic or sclerotic bone lesions. Review of the MIP images confirms the above findings. IMPRESSION: 1. No evidence for acute pulmonary embolus. 2. Lingula and bilateral lower lobe subsegmental atelectasis. Electronically Signed   By: Kerby Moors M.D.   On: 05/27/2017 14:34   Dg Chest Portable 1 View  Result Date: 05/25/2017 CLINICAL DATA:  Respiratory distress. EXAM: PORTABLE CHEST 1 VIEW COMPARISON:  Earlier the same day FINDINGS: 1843 hours. Lungs are hyperexpanded. Left base subsegmental atelectasis noted. No edema or focal airspace consolidation. No pleural effusion. The visualized bony structures of the thorax are intact. Telemetry leads overlie the chest. IMPRESSION: Hyperexpansion with left base atelectasis. Electronically Signed   By: Verda Cumins.D.  On: 05/25/2017 20:01       Today   Subjective:   Wylie Hail  patient's breathing is improved  Objective:   Blood pressure 137/80, pulse 73, temperature 98.1 F (36.7 C), resp. rate 18, height 6\' 3"  (1.905 m), weight 221 lb 9 oz (100.5 kg), SpO2 95 %.  .  Intake/Output Summary (Last 24 hours) at 05/29/17 1222 Last data filed at 05/29/17 0927  Gross per 24 hour  Intake              720 ml  Output             1125 ml  Net             -405 ml    Exam VITAL SIGNS: Blood pressure 137/80, pulse 73, temperature 98.1 F (36.7 C), resp. rate 18, height 6\' 3"  (1.905 m), weight 221 lb 9 oz (100.5 kg), SpO2 95 %.  GENERAL:  61 y.o.-year-old patient lying in the bed with no acute distress.  EYES: Pupils equal, round, reactive to light and accommodation. No scleral icterus. Extraocular muscles intact.  HEENT: Head atraumatic, normocephalic. Oropharynx and nasopharynx clear.  NECK:  Supple, no jugular venous distention. No thyroid enlargement, no tenderness.  LUNGS: Decreased breath sounds bilaterally  CARDIOVASCULAR: S1, S2 normal. No murmurs, rubs, or gallops.  ABDOMEN: Soft, nontender, nondistended. Bowel sounds present. No organomegaly or mass.  EXTREMITIES: No pedal edema, cyanosis, or clubbing.  NEUROLOGIC: Cranial nerves II through XII are intact. Muscle strength 5/5 in all extremities. Sensation intact. Gait not checked.  PSYCHIATRIC: The patient is alert and oriented x 3.  SKIN: No obvious rash, lesion, or ulcer.   Data Review     CBC w Diff: Lab Results  Component Value Date   WBC 11.7 (H) 05/26/2017   HGB 15.1 05/26/2017   HGB 15.3 05/25/2013   HCT 45.2 05/26/2017   HCT 46.6 05/25/2013   PLT 353 05/26/2017   PLT 350 05/29/2013   LYMPHOPCT 34 05/25/2017   MONOPCT 9 05/25/2017   EOSPCT 2 05/25/2017   BASOPCT 0 05/25/2017   CMP: Lab Results  Component Value Date   NA 139 05/26/2017   NA 142 05/25/2013   K 4.2 05/26/2017   K 3.7 05/25/2013   CL 110 05/26/2017   CL 109 (H) 05/25/2013   CO2 21 (L) 05/26/2017   CO2 22  05/25/2013   BUN 23 (H) 05/26/2017   BUN 16 05/25/2013   CREATININE 1.01 05/26/2017   CREATININE 1.08 05/25/2013   PROT 7.9 05/25/2017   PROT 7.7 05/25/2013   ALBUMIN 4.2 05/25/2017   ALBUMIN 3.8 05/25/2013   BILITOT 0.6 05/25/2017   BILITOT 0.5 05/25/2013   ALKPHOS 96 05/25/2017   ALKPHOS 119 05/25/2013   AST 27 05/25/2017   AST 20 05/25/2013   ALT 15 (L) 05/25/2017   ALT 23 05/25/2013  .  Micro Results Recent Results (from the past 240 hour(s))  Culture, sputum-assessment     Status: None   Collection Time: 05/26/17  4:16 AM  Result Value Ref Range Status   Specimen Description SPUTUM  Final   Special Requests NONE  Final   Sputum evaluation   Final    Sputum specimen not acceptable for testing.  Please recollect.     Report Status 05/26/2017 FINAL  Final        Code Status Orders        Start     Ordered   05/25/17 2258  Full  code  Continuous     05/25/17 2257    Code Status History    Date Active Date Inactive Code Status Order ID Comments User Context   This patient has a current code status but no historical code status.          Follow-up Information    Tracie Harrier, MD Follow up on 06/09/2017.   Specialty:  Internal Medicine Why:  at 12:30P  Take all her medications and insurance card.  Contact information: [place] [address] [phone]        Erby Pian, MD. Go on 06/15/2017.   Specialty:  Specialist Why:  pft's, Appointment Time: 2:00pm Contact information: [place]  [id number] [phone]           Discharge Medications   Allergies as of 05/29/2017   No Known Allergies     Medication List    TAKE these medications   albuterol 108 (90 Base) MCG/ACT inhaler Commonly known as:  PROVENTIL HFA;VENTOLIN HFA Inhale 2 puffs into the lungs every 6 (six) hours as needed for wheezing or shortness of breath.    azithromycin 250 MG tablet Commonly known as:  ZITHROMAX One tab daily   budesonide-formoterol 160-4.5 MCG/ACT inhaler Commonly known as:  SYMBICORT Inhale 2 puffs into the lungs 2 (two) times daily.   ipratropium-albuterol 0.5-2.5 (3) MG/3ML Soln Commonly known as:  DUONEB Take 3 mLs by nebulization every 6 (six) hours as needed. What changed:  when to take this   predniSONE 10 MG (21) Tbpk tablet Commonly known as:  STERAPRED UNI-PAK 21 TAB Start at 60mg  taper by 10mg  until complete   tiotropium 18 MCG inhalation capsule Commonly known as:  SPIRIVA Place 1 capsule (18 mcg total) into inhaler and inhale daily.          Total Time in preparing paper work, data evaluation and todays exam - 35 minutes  Dustin Flock M.D on 05/29/2017 at 12:22 PM  [facility]   Office  [phone]

## 2017-05-29 NOTE — Care Management (Signed)
Informed by attending that patient does not have insurance to cover medications which is different from what he relayed to [facility] 6/15. This cm spoke directly to patient. He does have Part D .  His copay for spirva and symbicort is 90 dollars each.  Discussed that during open enrollment he may want to obtain some assistance to reassess his part D plan.  Provided patient with written information from the Medicare Web site to determine if he qualifies for additional assistance.  He receives 1280.00 a month in [employer].  He also relays that "he had dropped everything when leaving [place] and left his "little portable oxygen tank."  Says that he only "uses it when he needs it."  He does not know the name of the agency that provided it. Discussed he either needs to have it mailed to him by family or call his sister and find out the name of the company and contact them himself.  Patient has not qualified for oxygen.  Pharmacy has completed spiriva referral.  Symbicort and spiriva are not new medications for patient

## 2017-05-29 NOTE — Care Management Important Message (Signed)
Important Message  Patient Details  Name: Ernest [name] MRN: [id number] Date of Birth: [birth year]   Medicare Important Message Given:  Yes    Katrina Stack, RN 05/29/2017, 9:25 AM

## 2017-05-29 NOTE — Progress Notes (Signed)
Pt discharged to home via wc.  Instructions and rx given to pt.  Questions answered.  No distress.  

## 2017-05-29 NOTE — Discharge Instructions (Signed)
[place] at [place]:  Regular diet  DISCHARGE CONDITION:  Stable  ACTIVITY:  Activity as tolerated  OXYGEN:  Home Oxygen: Yes.     Oxygen Delivery: 2l 02 via at night time as using before  DISCHARGE LOCATION:  home    ADDITIONAL DISCHARGE INSTRUCTION:   If you experience worsening of your admission symptoms, develop shortness of breath, life threatening emergency, suicidal or homicidal thoughts you must seek medical attention immediately by calling 911 or calling your MD immediately  if symptoms less severe.  You Must read complete instructions/literature along with all the possible adverse reactions/side effects for all the Medicines you take and that have been prescribed to you. Take any new Medicines after you have completely understood and accpet all the possible adverse reactions/side effects.   Please note  You were cared for by a hospitalist during your hospital stay. If you have any questions about your discharge medications or the care you received while you were in the hospital after you are discharged, you can call the unit and asked to speak with the hospitalist on call if the hospitalist that took care of you is not available. Once you are discharged, your primary care physician will handle any further medical issues. Please note that NO REFILLS for any discharge medications will be authorized once you are discharged, as it is imperative that you return to your primary care physician (or establish a relationship with a primary care physician if you do not have one) for your aftercare needs so that they can reassess your need for medications and monitor your lab values.

## 2017-09-14 ENCOUNTER — Inpatient Hospital Stay
Admission: EM | Admit: 2017-09-14 | Discharge: 2017-09-16 | DRG: 190 | Disposition: A | Discharge status: Home / Self Care | Payer: MEDICARE | Attending: Internal Medicine | Admitting: Internal Medicine

## 2017-09-14 ENCOUNTER — Emergency Department: Payer: MEDICARE

## 2017-09-14 ENCOUNTER — Encounter: Payer: Self-pay | Admitting: Intensive Care

## 2017-09-14 DIAGNOSIS — E1165 Type 2 diabetes mellitus with hyperglycemia: Secondary | ICD-10-CM | POA: Diagnosis not present

## 2017-09-14 DIAGNOSIS — J9621 Acute and chronic respiratory failure with hypoxia: Secondary | ICD-10-CM | POA: Diagnosis present

## 2017-09-14 DIAGNOSIS — F17210 Nicotine dependence, cigarettes, uncomplicated: Secondary | ICD-10-CM | POA: Diagnosis present

## 2017-09-14 DIAGNOSIS — R0902 Hypoxemia: Secondary | ICD-10-CM

## 2017-09-14 DIAGNOSIS — R000 Tachycardia, unspecified: Secondary | ICD-10-CM

## 2017-09-14 DIAGNOSIS — T380X5A Adverse effect of glucocorticoids and synthetic analogues, initial encounter: Secondary | ICD-10-CM | POA: Diagnosis not present

## 2017-09-14 DIAGNOSIS — J440 Chronic obstructive pulmonary disease with acute lower respiratory infection: Secondary | ICD-10-CM | POA: Diagnosis present

## 2017-09-14 DIAGNOSIS — Z79899 Other long term (current) drug therapy: Secondary | ICD-10-CM | POA: Diagnosis not present

## 2017-09-14 DIAGNOSIS — R61 Generalized hyperhidrosis: Secondary | ICD-10-CM

## 2017-09-14 DIAGNOSIS — X58XXXA Exposure to other specified factors, initial encounter: Secondary | ICD-10-CM | POA: Diagnosis not present

## 2017-09-14 DIAGNOSIS — J209 Acute bronchitis, unspecified: Secondary | ICD-10-CM | POA: Diagnosis not present

## 2017-09-14 DIAGNOSIS — R0603 Acute respiratory distress: Secondary | ICD-10-CM

## 2017-09-14 DIAGNOSIS — J441 Chronic obstructive pulmonary disease with (acute) exacerbation: Principal | ICD-10-CM

## 2017-09-14 LAB — CBC WITH DIFFERENTIAL/PLATELET
BASOS ABS: 0 10*3/uL (ref 0–0.1)
Basophils Relative: 0 %
Eosinophils Absolute: 0.2 10*3/uL (ref 0–0.7)
Eosinophils Relative: 2 %
HEMATOCRIT: 43.5 % (ref 40.0–52.0)
Hemoglobin: 14.6 g/dL (ref 13.0–18.0)
LYMPHS ABS: 1.9 10*3/uL (ref 1.0–3.6)
Lymphocytes Relative: 19 %
MCH: 25.6 pg — AB (ref 26.0–34.0)
MCHC: 33.6 g/dL (ref 32.0–36.0)
MCV: 76.2 fL — AB (ref 80.0–100.0)
Monocytes Absolute: 0.7 10*3/uL (ref 0.2–1.0)
Monocytes Relative: 7 %
NEUTROS ABS: 7.5 10*3/uL — AB (ref 1.4–6.5)
NEUTROS PCT: 72 %
Platelets: 404 10*3/uL (ref 150–440)
RBC: 5.7 MIL/uL (ref 4.40–5.90)
RDW: 15.6 % — AB (ref 11.5–14.5)
WBC: 10.4 10*3/uL (ref 3.8–10.6)

## 2017-09-14 LAB — BLOOD GAS, VENOUS
Acid-base deficit: 2 mmol/L (ref 0.0–2.0)
BICARBONATE: 21.1 mmol/L (ref 20.0–28.0)
DELIVERY SYSTEMS: POSITIVE
FIO2: 0.4
O2 Saturation: 90.8 %
PH VEN: 7.44 — AB (ref 7.250–7.430)
PO2 VEN: 58 mmHg — AB (ref 32.0–45.0)
Patient temperature: 37
pCO2, Ven: 31 mmHg — ABNORMAL LOW (ref 44.0–60.0)

## 2017-09-14 LAB — INFLUENZA PANEL BY PCR (TYPE A & B)
INFLBPCR: NEGATIVE
Influenza A By PCR: NEGATIVE

## 2017-09-14 LAB — COMPREHENSIVE METABOLIC PANEL
ALK PHOS: 95 U/L (ref 38–126)
ALT: 16 U/L — AB (ref 17–63)
AST: 24 U/L (ref 15–41)
Albumin: 4.1 g/dL (ref 3.5–5.0)
Anion gap: 11 (ref 5–15)
BILIRUBIN TOTAL: 0.5 mg/dL (ref 0.3–1.2)
BUN: 21 mg/dL — ABNORMAL HIGH (ref 6–20)
CALCIUM: 9.3 mg/dL (ref 8.9–10.3)
CO2: 20 mmol/L — ABNORMAL LOW (ref 22–32)
CREATININE: 1.12 mg/dL (ref 0.61–1.24)
Chloride: 107 mmol/L (ref 101–111)
GFR calc non Af Amer: >60 mL/min (ref >60)
GLUCOSE: 126 mg/dL — AB (ref 65–99)
Potassium: 4.2 mmol/L (ref 3.5–5.1)
SODIUM: 138 mmol/L (ref 135–145)
TOTAL PROTEIN: 7.7 g/dL (ref 6.5–8.1)

## 2017-09-14 LAB — LACTIC ACID, PLASMA
Lactic Acid, Venous: 2.9 mmol/L (ref 0.5–1.9)
Lactic Acid, Venous: 2 mmol/L (ref 0.5–1.9)

## 2017-09-14 LAB — BRAIN NATRIURETIC PEPTIDE: B Natriuretic Peptide: 11 pg/mL (ref 0.0–100.0)

## 2017-09-14 LAB — GLUCOSE, CAPILLARY: Glucose-Capillary: 175 mg/dL — ABNORMAL HIGH (ref 65–99)

## 2017-09-14 LAB — TROPONIN I: Troponin I: <0.03 ng/mL (ref <0.03)

## 2017-09-14 MED ORDER — DEXTROSE 5 % IV SOLN
500.0000 mg | Freq: Once | INTRAVENOUS | Status: AC
  Administered 2017-09-14: 500 mg via INTRAVENOUS
  Filled 2017-09-14: qty 500

## 2017-09-14 MED ORDER — METHYLPREDNISOLONE SODIUM SUCC 125 MG IJ SOLR
125.0000 mg | Freq: Once | INTRAMUSCULAR | Status: AC
  Administered 2017-09-14: 125 mg via INTRAVENOUS

## 2017-09-14 MED ORDER — DOCUSATE SODIUM 100 MG PO CAPS: 100.0000 mg | ORAL_CAPSULE | Freq: Two times a day (BID) | ORAL | Status: DC | PRN

## 2017-09-14 MED ORDER — LEVOFLOXACIN 500 MG PO TABS
ORAL_TABLET | ORAL | Status: AC
  Filled 2017-09-14: qty 1

## 2017-09-14 MED ORDER — CEFTRIAXONE SODIUM IN DEXTROSE 20 MG/ML IV SOLN
1.0000 g | Freq: Once | INTRAVENOUS | Status: AC
  Administered 2017-09-14: 1 g via INTRAVENOUS
  Filled 2017-09-14: qty 50

## 2017-09-14 MED ORDER — ORAL CARE MOUTH RINSE
15.0000 mL | Freq: Two times a day (BID) | OROMUCOSAL | Status: DC
  Administered 2017-09-15 (×2): 15 mL via OROMUCOSAL

## 2017-09-14 MED ORDER — GUAIFENESIN-CODEINE 100-10 MG/5ML PO SOLN
10.0000 mL | Freq: Four times a day (QID) | ORAL | Status: DC | PRN
  Administered 2017-09-14 – 2017-09-15 (×2): 10 mL via ORAL
  Filled 2017-09-14 (×2): qty 10

## 2017-09-14 MED ORDER — METHYLPREDNISOLONE SODIUM SUCC 125 MG IJ SOLR
INTRAMUSCULAR | Status: AC
  Administered 2017-09-14: 125 mg via INTRAVENOUS
  Filled 2017-09-14: qty 2

## 2017-09-14 MED ORDER — BUDESONIDE 0.25 MG/2ML IN SUSP
RESPIRATORY_TRACT | Status: AC
  Filled 2017-09-14: qty 2

## 2017-09-14 MED ORDER — TIOTROPIUM BROMIDE MONOHYDRATE 18 MCG IN CAPS
18.0000 ug | ORAL_CAPSULE | Freq: Every day | RESPIRATORY_TRACT | Status: DC
  Administered 2017-09-15 – 2017-09-16 (×2): 18 ug via RESPIRATORY_TRACT
  Filled 2017-09-14: qty 5

## 2017-09-14 MED ORDER — IPRATROPIUM-ALBUTEROL 0.5-2.5 (3) MG/3ML IN SOLN
3.0000 mL | RESPIRATORY_TRACT | Status: DC
  Administered 2017-09-14 – 2017-09-16 (×9): 3 mL via RESPIRATORY_TRACT
  Filled 2017-09-14 (×9): qty 3

## 2017-09-14 MED ORDER — HEPARIN SODIUM (PORCINE) 5000 UNIT/ML IJ SOLN
5000.0000 [IU] | Freq: Three times a day (TID) | INTRAMUSCULAR | Status: DC
  Administered 2017-09-14 – 2017-09-16 (×5): 5000 [IU] via SUBCUTANEOUS
  Filled 2017-09-14 (×5): qty 1

## 2017-09-14 MED ORDER — MOMETASONE FURO-FORMOTEROL FUM 200-5 MCG/ACT IN AERO
2.0000 | INHALATION_SPRAY | Freq: Two times a day (BID) | RESPIRATORY_TRACT | Status: DC
  Administered 2017-09-14 – 2017-09-15 (×2): 2 via RESPIRATORY_TRACT
  Filled 2017-09-14: qty 8.8

## 2017-09-14 MED ORDER — BUDESONIDE 0.25 MG/2ML IN SUSP
0.2500 mg | Freq: Two times a day (BID) | RESPIRATORY_TRACT | Status: DC
  Administered 2017-09-14 – 2017-09-15 (×2): 0.25 mg via RESPIRATORY_TRACT
  Filled 2017-09-14: qty 2

## 2017-09-14 MED ORDER — LEVOFLOXACIN 500 MG PO TABS
500.0000 mg | ORAL_TABLET | Freq: Every day | ORAL | Status: DC
Start: 2017-09-14 — End: 2017-09-16
  Administered 2017-09-14 – 2017-09-16 (×3): 500 mg via ORAL
  Filled 2017-09-14 (×3): qty 1

## 2017-09-14 MED ORDER — IPRATROPIUM-ALBUTEROL 0.5-2.5 (3) MG/3ML IN SOLN
3.0000 mL | Freq: Once | RESPIRATORY_TRACT | Status: AC
  Administered 2017-09-14: 3 mL via RESPIRATORY_TRACT

## 2017-09-14 MED ORDER — LORAZEPAM 2 MG/ML IJ SOLN
1.0000 mg | Freq: Once | INTRAMUSCULAR | Status: AC
  Administered 2017-09-14: 1 mg via INTRAVENOUS
  Filled 2017-09-14: qty 1

## 2017-09-14 MED ORDER — METHYLPREDNISOLONE SODIUM SUCC 125 MG IJ SOLR
60.0000 mg | Freq: Four times a day (QID) | INTRAMUSCULAR | Status: DC
  Administered 2017-09-14 – 2017-09-16 (×6): 60 mg via INTRAVENOUS
  Filled 2017-09-14 (×6): qty 2

## 2017-09-14 MED ORDER — NICOTINE 21 MG/24HR TD PT24
21.0000 mg | MEDICATED_PATCH | Freq: Every day | TRANSDERMAL | Status: DC
  Administered 2017-09-14 – 2017-09-16 (×3): 21 mg via TRANSDERMAL
  Filled 2017-09-14 (×3): qty 1

## 2017-09-14 MED ORDER — INSULIN ASPART 100 UNIT/ML ~~LOC~~ SOLN
0.0000 [IU] | Freq: Three times a day (TID) | SUBCUTANEOUS | Status: DC
  Administered 2017-09-15: 1 [IU] via SUBCUTANEOUS
  Administered 2017-09-15: 2 [IU] via SUBCUTANEOUS
  Administered 2017-09-15 – 2017-09-16 (×2): 1 [IU] via SUBCUTANEOUS
  Filled 2017-09-14 (×4): qty 1

## 2017-09-14 MED ORDER — IPRATROPIUM-ALBUTEROL 0.5-2.5 (3) MG/3ML IN SOLN
RESPIRATORY_TRACT | Status: AC
  Filled 2017-09-14: qty 6

## 2017-09-14 NOTE — ED Notes (Signed)
Second set blood cultures drawn

## 2017-09-14 NOTE — ED Provider Notes (Signed)
[facility] Emergency Department Provider Note  ____________________________________________  Time seen: Approximately 5:05 PM  I have reviewed the triage vital signs and the nursing notes.   HISTORY  Chief Complaint Shortness of Breath and Chest Pain    HPI Ernest [name] is a 61 y.o. male the history of COPD, DM, presenting with acute respiratory distress. The patient states that he has been having productive cough associated with subjective fevers for the past several days. He went to have a flu shot today, and afterwards began to have acute respiratory distress with shortness of breath. He has associated diffuse chest "tightness." His pain is not pleuritic and he has not noted any lower extremity swelling or calf pain. No nausea vomiting or diarrhea.   Past Medical History:  Diagnosis Date  . COPD (chronic obstructive pulmonary disease) ([place])   . Diabetes mellitus without complication [facility])     Patient Active Problem List   Diagnosis Date Noted  . Acute on chronic respiratory failure with hypoxia ([place]) 05/25/2017  . COPD with acute exacerbation ([place]) 05/25/2017  . COPD with acute bronchitis ([place]) 05/25/2017  . Leukocytosis 05/25/2017  . Acute respiratory failure with hypoxia ([place]) 05/25/2017    History reviewed. No pertinent surgical history.  Current Outpatient Rx  . Order #: [id number] Class: Historical Med  . Order #: [id number] Class: Print  . Order #: [id number] Class: Print  . Order #: [id number] Class: Print  . Order #: [id number] Class: Print  . Order #: [id number] Class: Print    Allergies Patient has no known allergies.  History reviewed. No pertinent family history.  Social History Social History  Substance Use Topics  . Smoking status: Current Every Day Smoker    Packs/day: 0.50    Types: Cigarettes  . Smokeless tobacco: Never Used  . Alcohol use No    Review of Systems Constitutional: positive subjective fever and general  malaise. Eyes: No visual changes.positive eye discharge bilaterally ENT: No sore throat. positivecongestion andrhinorrhea. Cardiovascular: positivechest tightness. Denies palpitations. Respiratory: positiveshortness of breath.  positivecough. Gastrointestinal: No abdominal pain.  No nausea, no vomiting.  No diarrhea.  No constipation. Genitourinary: Negative for dysuria. Musculoskeletal: Negative for back pain.no large semi-swelling or calf pain. Skin: Negative for rash. Neurological: Negative for headaches. No focal numbness, tingling or weakness.     ____________________________________________   PHYSICAL EXAM:  VITAL SIGNS: ED Triage Vitals  Enc Vitals Group     BP --      Pulse Rate 09/14/17 1703 (!) 109     Resp 09/14/17 1703 (!) 25     Temp --      Temp src --      SpO2 09/14/17 1703 98 %     Weight 09/14/17 1704 234 lb (106.1 kg)     Height 09/14/17 1704 6\' 3"  (1.905 m)     Head Circumference --      Peak Flow --      Pain Score 09/14/17 1703 10     Pain Loc --      Pain Edu? --      Excl. in [place]? --     Constitutional: Alert and oriented. Answers questions appropriately.the patient is diaphoretic and in extreme respiratory distress upon arrival. Eyes: Conjunctivae are normal.  EOMI. No scleral icterus.bilateral eye discharge. Head: Atraumatic. Nose: No congestion/rhinnorhea. Mouth/Throat: Mucous membranes are moist.  Neck: No stridor.  Supple.  Positive JVD. Negative meningismus. Cardiovascular: astrate, regular rhythm. No murmurs, rubs or gallops.  Respiratory: the patient is in respiratory distress  with accessory muscle use and retractions. His O2 sats are 98% on 2 L nasal cannula, but he has significant wheezing in all the lung fields as well as in increasedexpiratory phase. I do not hear any rales or rhonchi. Gastrointestinal: Soft, nontender and nondistended.  No guarding or rebound.  No peritoneal signs. Musculoskeletal: No LE edema. No ttp in the calves or  palpable cords.  Negative Homan's sign. Neurologic:  A&Ox3.  Speech is clear.  Face and smile are symmetric.  EOMI.  Moves all extremities well. Skin:  Skin is warm, dry and intact. No rash noted. Psychiatric: Mood and affect are normal. Speech and behavior are normal.  Normal judgement.  ____________________________________________   LABS (all labs ordered are listed, but only abnormal results are displayed)  Labs Reviewed  CBC WITH DIFFERENTIAL/PLATELET - Abnormal; Notable for the following:       Result Value   MCV 76.2 (*)    MCH 25.6 (*)    RDW 15.6 (*)    Neutro Abs 7.5 (*)    All other components within normal limits  LACTIC ACID, PLASMA - Abnormal; Notable for the following:    Lactic Acid, Venous 2.9 (*)    All other components within normal limits  COMPREHENSIVE METABOLIC PANEL - Abnormal; Notable for the following:    CO2 20 (*)    Glucose, Bld 126 (*)    BUN 21 (*)    ALT 16 (*)    All other components within normal limits  BLOOD GAS, VENOUS - Abnormal; Notable for the following:    pH, Ven 7.44 (*)    pCO2, Ven 31 (*)    pO2, Ven 58.0 (*)    All other components within normal limits  CULTURE, BLOOD (ROUTINE X 2)  CULTURE, BLOOD (ROUTINE X 2)  TROPONIN I  BRAIN NATRIURETIC PEPTIDE  URINALYSIS, ROUTINE W REFLEX MICROSCOPIC  LACTIC ACID, PLASMA  INFLUENZA PANEL BY PCR (TYPE A & B)   ____________________________________________  EKG  ED ECG REPORT I, Eula Listen, the attending physician, personally viewed and interpreted this ECG.   Date: 09/14/2017  EKG Time: 1702  Rate: 107  Rhythm: sinus tachycardia  Axis: normal  Intervals:none  ST&T Change: No STEMI  ____________________________________________  RADIOLOGY  Dg Chest Port 1 View  Result Date: 09/14/2017 CLINICAL DATA:  Shortness of breath EXAM: PORTABLE CHEST 1 VIEW COMPARISON:  None. FINDINGS: The heart size and mediastinal contours are within normal limits. Atelectasis noted in both  lung bases. No pleural effusion or edema. No airspace opacities. The visualized skeletal structures are unremarkable. IMPRESSION: 1. Bibasilar atelectasis noted. Electronically Signed   By: Kerby Moors M.D.   On: 09/14/2017 17:34    ____________________________________________   PROCEDURES  Procedure(s) performed: None  Procedures  Critical Care performed: Yes, see critical care note(s) ____________________________________________   INITIAL IMPRESSION / ASSESSMENT AND PLAN / ED COURSE  Pertinent labs & imaging results that were available during my care of the patient were reviewed by me and considered in my medical decision making (see chart for details).  61 y.o. malewith a history of COPD, no known cardiac disease, presenting with several days of cough with fever, now with acute respiratory distress after going to the clinic to have a flu shot. Overall, the patient is in extremis and a DuoNeb was ordered and his placed immediately on BiPAP. He has been given 125 mg of Solu-Medrol for presumed COPD exacerbation. Allergic reaction to his flu injection is also possible but less likely;  he has no rash or hives nor any swelling of the mouth that would be suggestive of this. We will treat him for possible underlying pulmonary infection with ceftriaxone and azithromycin. A code sepsis has been called. We will get a BNP to evaluate for new onset congestive heart failure but the patient is not exhibiting any symptoms of peripheral edema at this time. The patient will require admission for further evaluation and treatment.  ----------------------------------------- 6:33 PM on 09/14/2017 -----------------------------------------  The patient has significantly improved at this time. On BiPAP, he is maintaining oxygen saturations in the high 90s to 100%. His work of breathing has slowed downand he is no longer in acute respiratory distress. His gas is reassuring without hypercarbia or hypoxia.his  chest x-ray shows bibasilar atelectasis but no acute infiltrate. This time, we will remove him from [place] continued to clinically monitor him for inpatient hospitalization.  ----------------------------------------- 6:46 PM on 09/14/2017 -----------------------------------------  At this time, the patient has successfully been weaned from BiPAP. On room air, his O2 sats are between 93 and 96% but I did give him some supplemental oxygen due to ongoing tachypnea. His wheezing has completely resolved but he continues to have a prolonged expiratory phase. There is no evidence of infection on his chest x-ray, but he has been given antibiotics in case there is a radiographic lag. At this time, the patient will be admitted for continued evaluation and treatment.  CRITICAL CARE Performed by: Eula Listen   Total critical care time: 45 minutes  Critical care time was exclusive of separately billable procedures and treating other patients.  Critical care was necessary to treat or prevent imminent or life-threatening deterioration.  Critical care was time spent personally by me on the following activities: development of treatment plan with patient and/or surrogate as well as nursing, discussions with consultants, evaluation of patient's response to treatment, examination of patient, obtaining history from patient or surrogate, ordering and performing treatments and interventions, ordering and review of laboratory studies, ordering and review of radiographic studies, pulse oximetry and re-evaluation of patient's condition.   ____________________________________________  FINAL CLINICAL IMPRESSION(S) / ED DIAGNOSES  Final diagnoses:  COPD exacerbation ([place])  Respiratory distress  Diaphoresis  Tachycardia         NEW MEDICATIONS STARTED DURING THIS VISIT:  New Prescriptions   No medications on file      Eula Listen, MD 09/14/17 1849

## 2017-09-14 NOTE — ED Notes (Addendum)
Removed from bipap per dr Mariea Clonts to trial off it. RT notified. Pt aware to call RN if breathing gets worse

## 2017-09-14 NOTE — ED Notes (Signed)
Date and time results received: 09/14/17 1832 (use smartphrase ".now" to insert current time)  Test: lactic acid Critical Value: 2.9  Name of Provider Notified: norman

## 2017-09-14 NOTE — ED Notes (Signed)
First set blood cultures drawn

## 2017-09-14 NOTE — ED Notes (Signed)
Pt much more relaxed. WOB has improved. Remains on bipap.

## 2017-09-14 NOTE — H&P (Signed)
[place] at [place] NAME: Ernest [name]    MR#:  [id number]  DATE OF BIRTH:  [birth year]  DATE OF ADMISSION:  09/14/2017  PRIMARY CARE PHYSICIAN: Patient, No Pcp Per   REQUESTING/REFERRING PHYSICIAN:  Mariea Clonts  CHIEF COMPLAINT:   Chief Complaint  Patient presents with  . Shortness of Breath  . Chest Pain    HISTORY OF PRESENT ILLNESS: Ernest [name]  is a 61 y.o. male with a known history of COPD, diabetes- went to PMD- Dr. Ginette Pitman 2 days ago to have flu shot. But for last 2-3 days also have cough and SOB, which is getting worse. Also have chest tightness and pain in left chest with cough. Today went back to clinic , but was in severe distress and constant coughing, so sent to ER.  Noted to be hypoxic and respi distress, so started on Bipap. After 1-2 hrs of bipap- improved, stable on nasal canula oxygen- so given to hospitalist team for admission.  PAST MEDICAL HISTORY:   Past Medical History:  Diagnosis Date  . COPD (chronic obstructive pulmonary disease) ([place])   . Diabetes mellitus without complication ([place])     PAST SURGICAL HISTORY: History reviewed. No pertinent surgical history.  SOCIAL HISTORY:  Social History  Substance Use Topics  . Smoking status: Current Every Day Smoker    Packs/day: 0.50    Types: Cigarettes  . Smokeless tobacco: Never Used  . Alcohol use No    FAMILY HISTORY:  Family History  Problem Relation Age of Onset  . Stroke Mother     DRUG ALLERGIES: No Known Allergies  REVIEW OF SYSTEMS:   CONSTITUTIONAL: No fever, fatigue or weakness.  EYES: No blurred or double vision.  EARS, NOSE, AND THROAT: No tinnitus or ear pain.  RESPIRATORY:have cough, shortness of breath, wheezing ,no hemoptysis.  CARDIOVASCULAR: No chest pain, orthopnea, edema.  GASTROINTESTINAL: No nausea, vomiting, diarrhea or abdominal pain.  GENITOURINARY: No dysuria, hematuria.  ENDOCRINE: No polyuria, nocturia,  HEMATOLOGY: No anemia,  easy bruising or bleeding SKIN: No rash or lesion. MUSCULOSKELETAL: No joint pain or arthritis.   NEUROLOGIC: No tingling, numbness, weakness.  PSYCHIATRY: No anxiety or depression.   MEDICATIONS AT HOME:  Prior to Admission medications   Medication Sig Start Date End Date Taking? Authorizing Provider  albuterol (PROVENTIL HFA;VENTOLIN HFA) 108 (90 Base) MCG/ACT inhaler Inhale 2 puffs into the lungs every 6 (six) hours as needed for wheezing or shortness of breath.   Yes [provider]  budesonide-formoterol (SYMBICORT) 160-4.5 MCG/ACT inhaler Inhale 2 puffs into the lungs 2 (two) times daily. 05/29/17  Yes Dustin Flock, MD  ipratropium-albuterol (DUONEB) 0.5-2.5 (3) MG/3ML SOLN Take 3 mLs by nebulization every 6 (six) hours as needed. 05/29/17  Yes Dustin Flock, MD  tiotropium (SPIRIVA) 18 MCG inhalation capsule Place 1 capsule (18 mcg total) into inhaler and inhale daily. 05/29/17  Yes Dustin Flock, MD  azithromycin (ZITHROMAX) 250 MG tablet One tab daily Patient not taking: Reported on 09/14/2017 05/29/17   Dustin Flock, MD  predniSONE (STERAPRED UNI-PAK 21 TAB) 10 MG (21) TBPK tablet Start at 60mg  taper by 10mg  until complete Patient not taking: Reported on 09/14/2017 05/29/17   Dustin Flock, MD      PHYSICAL EXAMINATION:   VITAL SIGNS: Blood pressure 131/88, pulse 89, temperature 98.9 F (37.2 C), temperature source Oral, resp. rate 19, height 6\' 3"  (1.905 m), weight 106.1 kg (234 lb), SpO2 93 %.  GENERAL:  61 y.o.-year-old patient  lying in the bed with acute distress due to coughing.  EYES: Pupils equal, round, reactive to light and accommodation. No scleral icterus. Extraocular muscles intact.  HEENT: Head atraumatic, normocephalic. Oropharynx and nasopharynx clear.  NECK:  Supple, no jugular venous distention. No thyroid enlargement, no tenderness.  LUNGS: Normal breath sounds bilaterally,Excessive wheezing, no crepitation. Positive for use of accessory muscles  of respiration.  CARDIOVASCULAR: S1, S2 normal. No murmurs, rubs, or gallops.  ABDOMEN: Soft, nontender, nondistended. Bowel sounds present. No organomegaly or mass.  EXTREMITIES: No pedal edema, cyanosis, or clubbing.  NEUROLOGIC: Cranial nerves II through XII are intact. Muscle strength 5/5 in all extremities. Sensation intact. Gait not checked.  PSYCHIATRIC: The patient is alert and oriented x 3.  SKIN: No obvious rash, lesion, or ulcer.   LABORATORY PANEL:   CBC  Recent Labs Lab 09/14/17 1712  WBC 10.4  HGB 14.6  HCT 43.5  PLT 404  MCV 76.2*  MCH 25.6*  MCHC 33.6  RDW 15.6*  LYMPHSABS 1.9  MONOABS 0.7  EOSABS 0.2  BASOSABS 0.0   ------------------------------------------------------------------------------------------------------------------  Chemistries   Recent Labs Lab 09/14/17 1712  NA 138  K 4.2  CL 107  CO2 20*  GLUCOSE 126*  BUN 21*  CREATININE 1.12  CALCIUM 9.3  AST 24  ALT 16*  ALKPHOS 95  BILITOT 0.5   ------------------------------------------------------------------------------------------------------------------ estimated creatinine clearance is 91.2 mL/min (by C-G formula based on SCr of 1.12 mg/dL). ------------------------------------------------------------------------------------------------------------------ No results for input(s): TSH, T4TOTAL, T3FREE, THYROIDAB in the last 72 hours.  Invalid input(s): FREET3   Coagulation profile No results for input(s): INR, PROTIME in the last 168 hours. ------------------------------------------------------------------------------------------------------------------- No results for input(s): DDIMER in the last 72 hours. -------------------------------------------------------------------------------------------------------------------  Cardiac Enzymes  Recent Labs Lab 09/14/17 1712  TROPONINI <0.03    ------------------------------------------------------------------------------------------------------------------ Invalid input(s): POCBNP  ---------------------------------------------------------------------------------------------------------------  Urinalysis No results found for: COLORURINE, APPEARANCEUR, LABSPEC, PHURINE, GLUCOSEU, HGBUR, BILIRUBINUR, KETONESUR, PROTEINUR, UROBILINOGEN, NITRITE, LEUKOCYTESUR   RADIOLOGY: Dg Chest Port 1 View  Result Date: 09/14/2017 CLINICAL DATA:  Shortness of breath EXAM: PORTABLE CHEST 1 VIEW COMPARISON:  None. FINDINGS: The heart size and mediastinal contours are within normal limits. Atelectasis noted in both lung bases. No pleural effusion or edema. No airspace opacities. The visualized skeletal structures are unremarkable. IMPRESSION: 1. Bibasilar atelectasis noted. Electronically Signed   By: Kerby Moors M.D.   On: 09/14/2017 17:34    EKG: Orders placed or performed during the hospital encounter of 09/14/17  . ED EKG 12-Lead  . ED EKG 12-Lead  . EKG 12-Lead  . EKG 12-Lead    IMPRESSION AND PLAN:  * Ac respi failure with hypoxia   Due to COPD exacerbation and bronchitis   IV solumedorol, Duoneb, Budesonide nebs, Spiriva.   Was on bipap initially. Now  On nasal canula.    Flu swab sent- follow.  * DM   ISS  * Active smoking   Councelled for 4 min, Offered Nicotine patch.  All the records are reviewed and case discussed with ED provider. Management plans discussed with the patient, family and they are in agreement.  CODE STATUS: Full. Code Status History    Date Active Date Inactive Code Status Order ID Comments User Context   05/25/2017 10:57 PM 05/29/2017  2:59 PM Full Code [id number]  Theodoro Grist, MD Inpatient      His wife is in room while my visit. TOTAL TIME TAKING CARE OF THIS PATIENT: 50 minutes.    Vaughan Basta M.D on 09/14/2017  Between 7am to 6pm - Pager - [phone]  After 6pm go to  [email or web address] - password [email or web address]  Office  [phone]  CC: Primary care physician; Patient, No Pcp Per   Note: This dictation was prepared with Dragon dictation along with smaller phrase technology. Any transcriptional errors that result from this process are unintentional.

## 2017-09-14 NOTE — ED Triage Notes (Signed)
PAtient received flu shot on Tuesday and was waiting to be seen at [facility] when he starting experiencing labored breathing and chest pains. Audible wheezing noted with difficulty breathing. HX COPD, smoker. Patient reports he has felt flu like symptoms since receiving flu shot.

## 2017-09-15 ENCOUNTER — Inpatient Hospital Stay: Payer: MEDICARE

## 2017-09-15 DIAGNOSIS — J441 Chronic obstructive pulmonary disease with (acute) exacerbation: Secondary | ICD-10-CM | POA: Diagnosis not present

## 2017-09-15 LAB — CBC
HEMATOCRIT: 43.1 % (ref 40.0–52.0)
HEMOGLOBIN: 14.3 g/dL (ref 13.0–18.0)
MCH: 25.4 pg — ABNORMAL LOW (ref 26.0–34.0)
MCHC: 33.1 g/dL (ref 32.0–36.0)
MCV: 76.7 fL — ABNORMAL LOW (ref 80.0–100.0)
Platelets: 380 10*3/uL (ref 150–440)
RBC: 5.61 MIL/uL (ref 4.40–5.90)
RDW: 15.7 % — AB (ref 11.5–14.5)
WBC: 9.4 10*3/uL (ref 3.8–10.6)

## 2017-09-15 LAB — BASIC METABOLIC PANEL
ANION GAP: 11 (ref 5–15)
BUN: 18 mg/dL (ref 6–20)
CHLORIDE: 105 mmol/L (ref 101–111)
CO2: 21 mmol/L — AB (ref 22–32)
Calcium: 9 mg/dL (ref 8.9–10.3)
Creatinine, Ser: 1.13 mg/dL (ref 0.61–1.24)
GFR calc Af Amer: >60 mL/min (ref >60)
GLUCOSE: 192 mg/dL — AB (ref 65–99)
POTASSIUM: 3.6 mmol/L (ref 3.5–5.1)
Sodium: 137 mmol/L (ref 135–145)

## 2017-09-15 LAB — LACTIC ACID, PLASMA: Lactic Acid, Venous: 3.4 mmol/L (ref 0.5–1.9)

## 2017-09-15 LAB — PSA: Prostatic Specific Antigen: 5.72 ng/mL — ABNORMAL HIGH (ref 0.00–4.00)

## 2017-09-15 LAB — GLUCOSE, CAPILLARY
GLUCOSE-CAPILLARY: 192 mg/dL — AB (ref 65–99)
Glucose-Capillary: 179 mg/dL — ABNORMAL HIGH (ref 65–99)
Glucose-Capillary: 146 mg/dL — ABNORMAL HIGH (ref 65–99)
Glucose-Capillary: 143 mg/dL — ABNORMAL HIGH (ref 65–99)

## 2017-09-15 LAB — PROCALCITONIN

## 2017-09-15 MED ORDER — GUAIFENESIN ER 600 MG PO TB12
600.0000 mg | ORAL_TABLET | Freq: Two times a day (BID) | ORAL | Status: DC
  Administered 2017-09-15 – 2017-09-16 (×3): 600 mg via ORAL
  Filled 2017-09-15 (×3): qty 1

## 2017-09-15 MED ORDER — BENZONATATE 100 MG PO CAPS
200.0000 mg | ORAL_CAPSULE | Freq: Three times a day (TID) | ORAL | Status: DC | PRN
  Administered 2017-09-15: 200 mg via ORAL
  Filled 2017-09-15: qty 2

## 2017-09-15 MED ORDER — SODIUM CHLORIDE 0.9 % IV SOLN
INTRAVENOUS | Status: DC
  Administered 2017-09-15: 07:00:00 via INTRAVENOUS

## 2017-09-15 MED ORDER — BUDESONIDE 0.25 MG/2ML IN SUSP
0.2500 mg | Freq: Two times a day (BID) | RESPIRATORY_TRACT | Status: DC
  Administered 2017-09-16: 0.25 mg via RESPIRATORY_TRACT
  Filled 2017-09-15: qty 2

## 2017-09-15 NOTE — Care Management Important Message (Signed)
Important Message  Patient Details  Name: Ernest [name] MRN: [id number] Date of Birth: 1956/07/19   Medicare Important Message Given:  Yes Signed IM notice given   Katrina Stack, RN 09/15/2017, 4:38 PM
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
## 2017-09-15 NOTE — Progress Notes (Signed)
[place] at [facility]                                                                                                                                                                                  Patient Demographics   Ernest [name], is a 61 y.o. male, DOB - August 09, 1956, HQI:[id number]  Admit date - 09/14/2017   Admitting Physician Vaughan Basta, MD  Outpatient Primary MD for the patient is Patient, No Pcp Per   LOS - 1  Subjective: Patient admitted with shortness of breath and cough continues to complain of some cough and shortness of breath His requesting PSA be checked that was supposed to be done as outpatient   Review of Systems:   CONSTITUTIONAL: No documented fever. No fatigue, weakness. No weight gain, no weight loss.  EYES: No blurry or double vision.  ENT: No tinnitus. No postnasal drip. No redness of the oropharynx.  RESPIRATORY: Positive cough, no wheeze, no hemoptysis. Positive dyspnea.  CARDIOVASCULAR: No chest pain. No orthopnea. No palpitations. No syncope.  GASTROINTESTINAL: No nausea, no vomiting or diarrhea. No abdominal pain. No melena or hematochezia.  GENITOURINARY: No dysuria or hematuria.  ENDOCRINE: No polyuria or nocturia. No heat or cold intolerance.  HEMATOLOGY: No anemia. No bruising. No bleeding.  INTEGUMENTARY: No rashes. No lesions.  MUSCULOSKELETAL: No arthritis. No swelling. No gout.  NEUROLOGIC: No numbness, tingling, or ataxia. No seizure-type activity.  PSYCHIATRIC: No anxiety. No insomnia. No ADD.    Vitals:   Vitals:   09/15/17 0812 09/15/17 0813 09/15/17 1122 09/15/17 1135  BP: 133/80  115/71   Pulse: 92  81   Resp: (!) 22  19   Temp: 98 F (36.7 C)  98.1 F (36.7 C)   TempSrc: Oral     SpO2: 100% 96% 97% 97%  Weight:      Height:        Wt Readings from Last 3 Encounters:  09/14/17 234 lb (106.1 kg)  05/29/17 221 lb 9 oz (100.5 kg)  12/22/15 212 lb (96.2 kg)     Intake/Output Summary  (Last 24 hours) at 09/15/17 1354 Last data filed at 09/15/17 1014  Gross per 24 hour  Intake              360 ml  Output             1100 ml  Net             -740 ml    Physical Exam:   GENERAL: Pleasant-appearing in no apparent distress.  HEAD, EYES, EARS, NOSE AND THROAT: Atraumatic, normocephalic. Extraocular muscles are intact. Pupils equal and reactive to light.  Sclerae anicteric. No conjunctival injection. No oro-pharyngeal erythema.  NECK: Supple. There is no jugular venous distention. No bruits, no lymphadenopathy, no thyromegaly.  HEART: Regular rate and rhythm,. No murmurs, no rubs, no clicks.  LUNGS: Bilateral wheezing throughout both lungs ABDOMEN: Soft, flat, nontender, nondistended. Has good bowel sounds. No hepatosplenomegaly appreciated.  EXTREMITIES: No evidence of any cyanosis, clubbing, or peripheral edema.  +2 pedal and radial pulses bilaterally.  NEUROLOGIC: The patient is alert, awake, and oriented x3 with no focal motor or sensory deficits appreciated bilaterally.  SKIN: Moist and warm with no rashes appreciated.  Psych: Not anxious, depressed LN: No inguinal LN enlargement    Antibiotics   Anti-infectives    Start     Dose/Rate Route Frequency Ordered Stop   09/14/17 1915  levofloxacin (LEVAQUIN) tablet 500 mg     500 mg Oral Daily 09/14/17 1906     09/14/17 1715  cefTRIAXone (ROCEPHIN) 1 g in dextrose 5 % 50 mL IVPB - Premix     1 g 100 mL/hr over 30 Minutes Intravenous  Once 09/14/17 1705 09/14/17 1758   09/14/17 1715  azithromycin (ZITHROMAX) 500 mg in dextrose 5 % 250 mL IVPB     500 mg 250 mL/hr over 60 Minutes Intravenous  Once 09/14/17 1705 09/14/17 1832      Medications   Scheduled Meds: . budesonide (PULMICORT) nebulizer solution  0.25 mg Nebulization BID  . guaiFENesin  600 mg Oral BID  . heparin  5,000 Units Subcutaneous Q8H  . insulin aspart  0-9 Units Subcutaneous TID WC  . ipratropium-albuterol  3 mL Nebulization Q4H  . levofloxacin   500 mg Oral Daily  . mouth rinse  15 mL Mouth Rinse BID  . methylPREDNISolone (SOLU-MEDROL) injection  60 mg Intravenous Q6H  . nicotine  21 mg Transdermal Daily  . tiotropium  18 mcg Inhalation Daily   Continuous Infusions: PRN Meds:.benzonatate, docusate sodium, guaiFENesin-codeine   Data Review:   Micro Results Recent Results (from the past 240 hour(s))  Blood Culture (routine x 2)     Status: None (Preliminary result)   Collection Time: 09/14/17  5:08 PM  Result Value Ref Range Status   Specimen Description BLOOD LEFT A  Final   Special Requests   Final    BOTTLES DRAWN AEROBIC AND ANAEROBIC Blood Culture adequate volume   Culture NO GROWTH < 12 HOURS  Final   Report Status PENDING  Incomplete  Blood Culture (routine x 2)     Status: None (Preliminary result)   Collection Time: 09/14/17  5:12 PM  Result Value Ref Range Status   Specimen Description BLOOD LEFT HAND  Final   Special Requests   Final    BOTTLES DRAWN AEROBIC AND ANAEROBIC Blood Culture adequate volume   Culture NO GROWTH < 12 HOURS  Final   Report Status PENDING  Incomplete    Radiology Reports Dg Chest 2 View  Result Date: 09/15/2017 CLINICAL DATA:  Shortness of breath and nonproductive cough EXAM: CHEST  2 VIEW COMPARISON:  09/14/2017 FINDINGS: Cardiac shadow is within normal limits. Mild left basilar atelectasis is seen. No sizable effusion or pneumothorax is noted. No bony abnormality is seen. IMPRESSION: Mild left basilar atelectasis. Electronically Signed   By: Inez Catalina M.D.   On: 09/15/2017 08:00   Dg Chest Port 1 View  Result Date: 09/14/2017 CLINICAL DATA:  Shortness of breath EXAM: PORTABLE CHEST 1 VIEW COMPARISON:  None. FINDINGS: The heart size and mediastinal contours are within normal  limits. Atelectasis noted in both lung bases. No pleural effusion or edema. No airspace opacities. The visualized skeletal structures are unremarkable. IMPRESSION: 1. Bibasilar atelectasis noted. Electronically  Signed   By: Kerby Moors M.D.   On: 09/14/2017 17:34     CBC  Recent Labs Lab 09/14/17 1712 09/15/17 0521  WBC 10.4 9.4  HGB 14.6 14.3  HCT 43.5 43.1  PLT 404 380  MCV 76.2* 76.7*  MCH 25.6* 25.4*  MCHC 33.6 33.1  RDW 15.6* 15.7*  LYMPHSABS 1.9  --   MONOABS 0.7  --   EOSABS 0.2  --   BASOSABS 0.0  --     Chemistries   Recent Labs Lab 09/14/17 1712 09/15/17 0521  NA 138 137  K 4.2 3.6  CL 107 105  CO2 20* 21*  GLUCOSE 126* 192*  BUN 21* 18  CREATININE 1.12 1.13  CALCIUM 9.3 9.0  AST 24  --   ALT 16*  --   ALKPHOS 95  --   BILITOT 0.5  --    ------------------------------------------------------------------------------------------------------------------ estimated creatinine clearance is 90.4 mL/min (by C-G formula based on SCr of 1.13 mg/dL). ------------------------------------------------------------------------------------------------------------------ No results for input(s): HGBA1C in the last 72 hours. ------------------------------------------------------------------------------------------------------------------ No results for input(s): CHOL, HDL, LDLCALC, TRIG, CHOLHDL, LDLDIRECT in the last 72 hours. ------------------------------------------------------------------------------------------------------------------ No results for input(s): TSH, T4TOTAL, T3FREE, THYROIDAB in the last 72 hours.  Invalid input(s): FREET3 ------------------------------------------------------------------------------------------------------------------ No results for input(s): VITAMINB12, FOLATE, FERRITIN, TIBC, IRON, RETICCTPCT in the last 72 hours.  Coagulation profile No results for input(s): INR, PROTIME in the last 168 hours.  No results for input(s): DDIMER in the last 72 hours.  Cardiac Enzymes  Recent Labs Lab 09/14/17 1712  TROPONINI <0.03    ------------------------------------------------------------------------------------------------------------------ Invalid input(s): POCBNP    Assessment & Plan  Patient is a 61 year old with history of COPD presenting with shortness of breath   *Acute on respiratory failure with hypoxia    Due to COPD exacerbation and bronchitis continue  IV solumedorol, Duoneb, Budesonide nebs, Spiriva. Flu test negative   I have checked pro-calcitonin which is negative so we will treat him with oral antibiotics  * DM   ISS, blood sugar slightly elevated due to steroids  * Active smoking Patient recommended to stop smoking yesterday     Code Status Orders        Start     Ordered   09/14/17 2046  Full code  Continuous     09/14/17 2045    Code Status History    Date Active Date Inactive Code Status Order ID Comments User Context   05/25/2017 10:57 PM 05/29/2017  2:59 PM Full Code [id number]  Theodoro Grist, MD Inpatient           Consults NONE  DVT Prophylaxis  Lovenox   Lab Results  Component Value Date   PLT 380 09/15/2017     Time Spent in minutes  35MIN  Greater than 50% of time spent in care coordination and counseling patient regarding the condition and plan of care.   Dustin Flock M.D on 09/15/2017 at 1:54 PM  Between 7am to 6pm - Pager - [phone]  After 6pm go to [email or web address] - password [email or web address] [place] [facility]   Office  [phone]

## 2017-09-16 DIAGNOSIS — J441 Chronic obstructive pulmonary disease with (acute) exacerbation: Secondary | ICD-10-CM | POA: Diagnosis not present

## 2017-09-16 LAB — GLUCOSE, CAPILLARY
GLUCOSE-CAPILLARY: 150 mg/dL — AB (ref 65–99)
GLUCOSE-CAPILLARY: 134 mg/dL — AB (ref 65–99)

## 2017-09-16 MED ORDER — GUAIFENESIN ER 600 MG PO TB12: 600.0000 mg | ORAL_TABLET | Freq: Two times a day (BID) | ORAL | 0 refills | Status: AC

## 2017-09-16 MED ORDER — LEVOFLOXACIN 500 MG PO TABS: 500.0000 mg | ORAL_TABLET | Freq: Every day | ORAL | 4 refills | Status: DC

## 2017-09-16 MED ORDER — NICOTINE 21 MG/24HR TD PT24: 21.0000 mg | MEDICATED_PATCH | Freq: Every day | TRANSDERMAL | 0 refills | Status: DC

## 2017-09-16 MED ORDER — GUAIFENESIN-CODEINE 100-10 MG/5ML PO SOLN: 10.0000 mL | Freq: Four times a day (QID) | ORAL | 0 refills | Status: DC | PRN

## 2017-09-16 MED ORDER — PREDNISONE 10 MG (21) PO TBPK: ORAL_TABLET | ORAL | 0 refills | Status: DC

## 2017-09-16 NOTE — Progress Notes (Signed)
Pt has refused cpap tonight; states he is "fine"

## 2017-09-16 NOTE — Progress Notes (Signed)
Discharge instructions explained to pt/ verbalized an understanding/ iv and tele removed/ rX given to pt/ will transport off unit via wheelchair.

## 2017-09-16 NOTE — Care Management Note (Signed)
Case Management Note  Patient Details  Name: Xyon Lukasik MRN: [id number] Date of Birth: December 30, 1955  Subjective/Objective:   Discussed new oxygen measurements with Mr [facility] nurse and was informed that she had walked him around the nurses station this morning and he maintained his 02 sats in the mid 90's on room air. No home health services ordered. Mr Ronan has medical insurance although [place].He has the phone number of the weekend care manager in the event that his insurance card does not work at [employer].               Action/Plan:   Expected Discharge Date:  09/16/17               Expected Discharge Plan:  Home/Self Care  In-House Referral:  NA  Discharge planning Services  NA  Post Acute Care Choice:  NA Choice offered to:  NA  DME Arranged:  N/A DME Agency:  NA  HH Arranged:  NA HH Agency:  NA  Status of Service:  Completed, signed off  If discussed at [place] of Stay Meetings, dates discussed:    Additional Comments:  Deylan Canterbury A, RN 09/16/2017, 10:22 AM

## 2017-09-16 NOTE — Discharge Instructions (Signed)
[facility] - [place] at [facility] ° °DIET:  °Cardiac diet ° °DISCHARGE CONDITION:  °Stable ° °ACTIVITY:  °Activity as tolerated ° °OXYGEN:  °Home Oxygen: No. °  °Oxygen Delivery: room air ° °DISCHARGE LOCATION:  °home  ° ° °ADDITIONAL DISCHARGE INSTRUCTION: stop smoking ° ° °If you experience worsening of your admission symptoms, develop shortness of breath, life threatening emergency, suicidal or homicidal thoughts you must seek medical attention immediately by calling 911 or calling your MD immediately  if symptoms less severe. ° °You Must read complete instructions/literature along with all the possible adverse reactions/side effects for all the Medicines you take and that have been prescribed to you. Take any new Medicines after you have completely understood and accpet all the possible adverse reactions/side effects.  ° °Please note ° °You were cared for by a hospitalist during your hospital stay. If you have any questions about your discharge medications or the care you received while you were in the hospital after you are discharged, you can call the unit and asked to speak with the hospitalist on call if the hospitalist that took care of you is not available. Once you are discharged, your primary care physician will handle any further medical issues. Please note that NO REFILLS for any discharge medications will be authorized once you are discharged, as it is imperative that you return to your primary care physician (or establish a relationship with a primary care physician if you do not have one) for your aftercare needs so that they can reassess your need for medications and monitor your lab values. ° ° °

## 2017-09-16 NOTE — Discharge Summary (Signed)
[facility] - [place] at [facility], 61 y.o., DOB 1956-05-14, MRN [id number]. Admission date: 09/14/2017 Discharge Date 09/16/2017 Primary MD Tracie Harrier, MD Admitting Physician Vaughan Basta, MD  Admission Diagnosis  Diaphoresis [R61] Respiratory distress [R06.03] Tachycardia [R00.0] Hypoxia [R09.02] COPD exacerbation ([place]) [J44.1]  Discharge Diagnosis   Principal Problem:   Acute on chronic respiratory failure with hypoxia (HCC) Acute on chronic  COPD with acute exacerbation (HCC) Acute bronchitis DM Nicotine abuse       Hospital Course Ernest [name]  is a 61 y.o. male with a known history of COPD, diabetes- went to PMD- Dr. Ginette Pitman 2 days ago to have flu shot. But for last 2-3 days also have cough and SOB, which is getting worse. Also have chest tightness and pain in left chest with cough. Today went back to clinic , but was in severe distress and constant coughing, so sent to ER. In er was on bipap temporally. He was admitted for copd exac,Hewas treated with nebs,steroids and abx . His symptoms improved after treatment.Pt doing better today            Consults  None  Significant Tests:  See full reports for all details    Dg Chest 2 View  Result Date: 09/15/2017 CLINICAL DATA:  Shortness of breath and nonproductive cough EXAM: CHEST  2 VIEW COMPARISON:  09/14/2017 FINDINGS: Cardiac shadow is within normal limits. Mild left basilar atelectasis is seen. No sizable effusion or pneumothorax is noted. No bony abnormality is seen. IMPRESSION: Mild left basilar atelectasis. Electronically Signed   By: Inez Catalina M.D.   On: 09/15/2017 08:00   Dg Chest Port 1 View  Result Date: 09/14/2017 CLINICAL DATA:  Shortness of breath EXAM: PORTABLE CHEST 1 VIEW COMPARISON:  None. FINDINGS: The heart size and mediastinal contours are within normal limits. Atelectasis noted in both lung bases. No pleural effusion or edema. No airspace opacities. The  visualized skeletal structures are unremarkable. IMPRESSION: 1. Bibasilar atelectasis noted. Electronically Signed   By: Kerby Moors M.D.   On: 09/14/2017 17:34       Today   Subjective:   Ernest [name]  Breathing improved, Objective:   Blood pressure 129/64, pulse 90, temperature 97.8 F (36.6 C), temperature source Oral, resp. rate 18, height 6\' 3"  (1.905 m), weight 234 lb (106.1 kg), SpO2 94 %.  .  Intake/Output Summary (Last 24 hours) at 09/16/17 1443 Last data filed at 09/16/17 1009  Gross per 24 hour  Intake              600 ml  Output             2745 ml  Net            -2145 ml    Exam VITAL SIGNS: Blood pressure 129/64, pulse 90, temperature 97.8 F (36.6 C), temperature source Oral, resp. rate 18, height 6\' 3"  (1.905 m), weight 234 lb (106.1 kg), SpO2 94 %.  GENERAL:  61 y.o.-year-old patient lying in the bed with no acute distress.  EYES: Pupils equal, round, reactive to light and accommodation. No scleral icterus. Extraocular muscles intact.  HEENT: Head atraumatic, normocephalic. Oropharynx and nasopharynx clear.  NECK:  Supple, no jugular venous distention. No thyroid enlargement, no tenderness.  LUNGS: Normal breath sounds bilaterally, no wheezing, rales,rhonchi or crepitation. No use of accessory muscles of respiration.  CARDIOVASCULAR: S1, S2 normal. No murmurs, rubs, or gallops.  ABDOMEN: Soft, nontender, nondistended. Bowel sounds present. No  organomegaly or mass.  EXTREMITIES: No pedal edema, cyanosis, or clubbing.  NEUROLOGIC: Cranial nerves II through XII are intact. Muscle strength 5/5 in all extremities. Sensation intact. Gait not checked.  PSYCHIATRIC: The patient is alert and oriented x 3.  SKIN: No obvious rash, lesion, or ulcer.   Data Review     CBC w Diff: Lab Results  Component Value Date   WBC 9.4 09/15/2017   HGB 14.3 09/15/2017   HGB 15.3 05/25/2013   HCT 43.1 09/15/2017   HCT 46.6 05/25/2013   PLT 380 09/15/2017   PLT 350  05/29/2013   LYMPHOPCT 19 09/14/2017   MONOPCT 7 09/14/2017   EOSPCT 2 09/14/2017   BASOPCT 0 09/14/2017   CMP: Lab Results  Component Value Date   NA 137 09/15/2017   NA 142 05/25/2013   K 3.6 09/15/2017   K 3.7 05/25/2013   CL 105 09/15/2017   CL 109 (H) 05/25/2013   CO2 21 (L) 09/15/2017   CO2 22 05/25/2013   BUN 18 09/15/2017   BUN 16 05/25/2013   CREATININE 1.13 09/15/2017   CREATININE 1.08 05/25/2013   PROT 7.7 09/14/2017   PROT 7.7 05/25/2013   ALBUMIN 4.1 09/14/2017   ALBUMIN 3.8 05/25/2013   BILITOT 0.5 09/14/2017   BILITOT 0.5 05/25/2013   ALKPHOS 95 09/14/2017   ALKPHOS 119 05/25/2013   AST 24 09/14/2017   AST 20 05/25/2013   ALT 16 (L) 09/14/2017   ALT 23 05/25/2013  .  Micro Results Recent Results (from the past 240 hour(s))  Blood Culture (routine x 2)     Status: None (Preliminary result)   Collection Time: 09/14/17  5:08 PM  Result Value Ref Range Status   Specimen Description BLOOD LEFT A  Final   Special Requests   Final    BOTTLES DRAWN AEROBIC AND ANAEROBIC Blood Culture adequate volume   Culture NO GROWTH 2 DAYS  Final   Report Status PENDING  Incomplete  Blood Culture (routine x 2)     Status: None (Preliminary result)   Collection Time: 09/14/17  5:12 PM  Result Value Ref Range Status   Specimen Description BLOOD LEFT HAND  Final   Special Requests   Final    BOTTLES DRAWN AEROBIC AND ANAEROBIC Blood Culture adequate volume   Culture NO GROWTH 2 DAYS  Final   Report Status PENDING  Incomplete        Code Status Orders        Start     Ordered   09/14/17 2046  Full code  Continuous     09/14/17 2045    Code Status History    Date Active Date Inactive Code Status Order ID Comments User Context   05/25/2017 10:57 PM 05/29/2017  2:59 PM Full Code [id number]  Theodoro Grist, MD Inpatient          Follow-up Information    Tracie Harrier, MD Follow up in 1 week(s).   Specialty:  Internal Medicine Why:  hosp f/u Contact  information: [address] [place] [address] [phone]           Discharge Medications   Allergies as of 09/16/2017   No Known Allergies     Medication List    STOP taking these medications   azithromycin 250 MG tablet Commonly known as:  ZITHROMAX     TAKE these medications   albuterol 108 (90 Base) MCG/ACT inhaler Commonly known as:  PROVENTIL HFA;VENTOLIN HFA  Inhale 2 puffs into the lungs every 6 (six) hours as needed for wheezing or shortness of breath.   budesonide-formoterol 160-4.5 MCG/ACT inhaler Commonly known as:  SYMBICORT Inhale 2 puffs into the lungs 2 (two) times daily.   guaiFENesin 600 MG 12 hr tablet Commonly known as:  MUCINEX Take 1 tablet (600 mg total) by mouth 2 (two) times daily.   guaiFENesin-codeine 100-10 MG/5ML syrup Take 10 mLs by mouth every 6 (six) hours as needed for cough.   ipratropium-albuterol 0.5-2.5 (3) MG/3ML Soln Commonly known as:  DUONEB Take 3 mLs by nebulization every 6 (six) hours as needed.   levofloxacin 500 MG tablet Commonly known as:  LEVAQUIN Take 1 tablet (500 mg total) by mouth daily.   nicotine 21 mg/24hr patch Commonly known as:  NICODERM CQ - dosed in mg/24 hours Place 1 patch (21 mg total) onto the skin daily.   predniSONE 10 MG (21) Tbpk tablet Commonly known as:  STERAPRED UNI-PAK 21 TAB Start at 60mg  taper by 10mg  until complete   tiotropium 18 MCG inhalation capsule Commonly known as:  SPIRIVA Place 1 capsule (18 mcg total) into inhaler and inhale daily.          Total Time in preparing paper work, data evaluation and todays exam - 35 minutes  Dustin Flock M.D on 09/16/2017 at 2:43 PM  [facility]   Office  [phone]

## 2017-09-16 NOTE — Progress Notes (Signed)
SATURATION QUALIFICATIONS: (This note is used to comply with regulatory documentation for home oxygen)  Patient Saturations on Room Air at Rest = 95%  Patient Saturations on Room Air while Ambulating = 93%  Patient Saturations on Liters of oxygen while Ambulating   Please briefly explain why patient needs home oxygen: 

## 2017-09-19 LAB — CULTURE, BLOOD (ROUTINE X 2)
CULTURE: NO GROWTH
Culture: NO GROWTH
SPECIAL REQUESTS: ADEQUATE
Special Requests: ADEQUATE

## 2017-10-04 ENCOUNTER — Encounter: Payer: Self-pay | Admitting: Urology

## 2017-10-04 ENCOUNTER — Ambulatory Visit (INDEPENDENT_AMBULATORY_CARE_PROVIDER_SITE_OTHER): Payer: Medicare HMO | Admitting: Urology

## 2017-10-04 VITALS — BP 135/79 | HR 87 | Ht 75.0 in | Wt 232.0 lb

## 2017-10-04 DIAGNOSIS — R972 Elevated prostate specific antigen [PSA]: Secondary | ICD-10-CM | POA: Diagnosis not present

## 2017-10-04 LAB — URINALYSIS, COMPLETE
Bilirubin, UA: NEGATIVE
GLUCOSE, UA: NEGATIVE
KETONES UA: NEGATIVE
Leukocytes, UA: NEGATIVE
Nitrite, UA: NEGATIVE
PROTEIN UA: NEGATIVE
RBC, UA: NEGATIVE
Specific Gravity, UA: <1.005 — ABNORMAL LOW (ref 1.005–1.030)
Urobilinogen, Ur: 0.2 mg/dL (ref 0.2–1.0)
pH, UA: 5.5 (ref 5.0–7.5)

## 2017-10-04 LAB — BLADDER SCAN AMB NON-IMAGING

## 2017-10-04 MED ORDER — LIDOCAINE HCL 2 % EX GEL: 1.0000 "application " | Freq: Once | CUTANEOUS | Status: DC

## 2017-10-04 MED ORDER — ALFUZOSIN HCL ER 10 MG PO TB24: 10.0000 mg | ORAL_TABLET | Freq: Every day | ORAL | 0 refills | Status: DC

## 2017-10-04 MED ORDER — CIPROFLOXACIN HCL 500 MG PO TABS: 500.0000 mg | ORAL_TABLET | Freq: Once | ORAL | Status: DC

## 2017-10-04 NOTE — Progress Notes (Signed)
10/04/2017 11:12 AM   Ernest [name] Jun 22, 1956 [id number]  Referring provider: Tracie Harrier, MD [address] [facility] [place], [place] [address]  Chief Complaint  Patient presents with  . Elevated PSA    New Patient    HPI: Ernest [name] is a 61 yo male seen in consultation at the request of Dr. Ginette Pitman for evaluation of an elevated PSA.  He was seen on 09/11/2017 and PSA was elevated at 6.93.  He was hospitalized in early October for a COPD exacerbation and a repeat PSA during that hospitalization remain elevated at 5.72 which was drawn on 09/15/2017.  Past urologic history remarkable for an undescended testis with orchiopexy.  He has bothersome lower urinary tract symptoms consisting of urinary frequency, urgency, intermittency, hesitancy and straining to urinate.  He has occasional dysuria.  Denies gross hematuria.  Denies flank, abdominal, pelvic or scrotal pain.  He did try Flomax however could not tolerate secondary to nightmares.  There is no family history of prostate cancer.   PMH: Past Medical History:  Diagnosis Date  . Acute on chronic respiratory failure with hypoxia ([place]) 05/25/2017  . Acute respiratory failure with hypoxia ([place]) 05/25/2017  . COPD (chronic obstructive pulmonary disease) ([place])   . COPD with acute bronchitis ([place]) 05/25/2017  . COPD with acute exacerbation ([place]) 05/25/2017  . Diabetes mellitus without complication ([place])   . Leukocytosis 05/25/2017    Surgical History: Past Surgical History:  Procedure Laterality Date  . TESTICLE SURGERY     undecended testicle repair    Home Medications:  Allergies as of 10/04/2017   No Known Allergies     Medication List       Accurate as of 10/04/17 11:12 AM. Always use your most recent med list.          albuterol 108 (90 Base) MCG/ACT inhaler Commonly known as:  PROVENTIL HFA;VENTOLIN HFA Inhale 2 puffs into the lungs every 6 (six) hours as needed for wheezing or shortness of breath.   budesonide-formoterol 160-4.5 MCG/ACT inhaler Commonly known as:  SYMBICORT Inhale 2 puffs into the lungs 2 (two) times daily.   furosemide 20 MG tablet Commonly known as:  LASIX Take 20 mg by mouth.   nicotine 21 mg/24hr patch Commonly known as:  NICODERM CQ - dosed in mg/24 hours Place 1 patch (21 mg total) onto the skin daily.   omeprazole 20 MG capsule Commonly known as:  PRILOSEC Take 20 mg by mouth daily.   predniSONE 10 MG (21) Tbpk tablet Commonly known as:  STERAPRED UNI-PAK 21 TAB Start at 60mg  taper by 10mg  until complete   sertraline 50 MG tablet Commonly known as:  ZOLOFT Take 50 mg by mouth daily.       Allergies: No Known Allergies  Family History: Family History  Problem Relation Age of Onset  . Stroke Mother   . Breast cancer Mother   . Bladder Cancer Neg Hx   . Kidney cancer Neg Hx   . Prostate cancer Neg Hx     Social History:  reports that he has been smoking Cigarettes.  He has been smoking about 0.50 packs per day. He has never used smokeless tobacco. He reports that he does not drink alcohol. His drug history is not on file.  ROS: UROLOGY Frequent Urination?: No Hard to postpone urination?: Yes Burning/pain with urination?: Yes Get up at night to urinate?: Yes Leakage of urine?: Yes Urine stream starts and stops?: Yes Trouble starting stream?: Yes Do  you have to strain to urinate?: Yes Blood in urine?: No Urinary tract infection?: No Sexually transmitted disease?: Yes Injury to kidneys or bladder?: No Painful intercourse?: No Weak stream?: Yes Erection problems?: No Penile pain?: Yes  Gastrointestinal Nausea?: No Vomiting?: No Indigestion/heartburn?: No Diarrhea?: No Constipation?: No  Constitutional Fever: No Night sweats?: No Weight loss?: No Fatigue?: No  Skin Skin rash/lesions?: Yes Itching?: No  Eyes Blurred vision?: No Double vision?: Yes  Ears/Nose/Throat Sore throat?: No Sinus problems?:  No  Hematologic/Lymphatic Swollen glands?: No Easy bruising?: No  Cardiovascular Leg swelling?: No Chest pain?: No  Respiratory Cough?: No Shortness of breath?: No  Endocrine Excessive thirst?: No  Musculoskeletal Back pain?: Yes Joint pain?: Yes  Neurological Headaches?: No Dizziness?: No  Psychologic Depression?: No Anxiety?: No  Physical Exam: BP 135/79   Pulse 87   Ht 6\' 3"  (1.905 m)   Wt 232 lb (105.2 kg)   BMI 29.00 kg/m   Constitutional:  Alert and oriented, No acute distress. HEENT: [place] AT, moist mucus membranes.  Trachea midline, no masses. Cardiovascular: No clubbing, cyanosis, or edema. Respiratory: Normal respiratory effort, no increased work of breathing. GI: Abdomen is soft, nontender, nondistended, no abdominal masses GU: No CVA tenderness.  Penis is circumcised without lesions, left testis high riding and atrophic, right testis palpably normal.  Prostate 50 g, smooth without nodules. Skin: No rashes, bruises or suspicious lesions. Lymph: No cervical or inguinal adenopathy. Neurologic: Grossly intact, no focal deficits, moving all 4 extremities. Psychiatric: Normal mood and affect.  Laboratory Data: Lab Results  Component Value Date   WBC 9.4 09/15/2017   HGB 14.3 09/15/2017   HCT 43.1 09/15/2017   MCV 76.7 (L) 09/15/2017   PLT 380 09/15/2017    Lab Results  Component Value Date   CREATININE 1.13 09/15/2017     Lab Results  Component Value Date   HGBA1C 6.6 (H) 05/26/2017    Urinalysis Negative dipstick and microscopy   Assessment & Plan:    1. Elevated PSA Although PSA is a prostate cancer screening test he was informed that cancer is not the most common cause of an elevated PSA. Other potential causes including BPH and inflammation were discussed. He was informed that the only way to adequately diagnose prostate cancer would be a transrectal ultrasound and biopsy of the prostate. The procedure was discussed including potential  risks of bleeding and infection/sepsis. He was also informed that a negative biopsy does not conclusively rule out the possibility that prostate cancer may be present and that continued monitoring is required. The use of newer adjunctive blood tests including PHI and 4kScore were discussed. The use of multiparametric prostate MRI was also discussed however is not typically used for initial evaluation of an elevated PSA. Continued periodic surveillance was also discussed.  Since he does have bothersome lower urinary tract symptoms and Rx for alfuzocin was sent to his pharmacy.    Follow-up 1 month for symptom reassessment and repeat PSA.  If his PSA remains elevated we discussed additional options as above.  2.  BPH with lower urinary tract symptoms PVR by bladder scan was 47 mL.  Urinalysis was normal.   - Urinalysis, Complete    Abbie Sons, MD  [facility] [address], [place], [place] [address] [phone]

## 2017-10-30 ENCOUNTER — Other Ambulatory Visit: Payer: Medicare HMO

## 2017-10-30 ENCOUNTER — Encounter: Payer: Self-pay | Admitting: Urology

## 2017-11-01 ENCOUNTER — Ambulatory Visit (INDEPENDENT_AMBULATORY_CARE_PROVIDER_SITE_OTHER): Payer: Medicare HMO | Admitting: Urology

## 2017-11-01 ENCOUNTER — Encounter: Payer: Self-pay | Admitting: Urology

## 2017-11-01 ENCOUNTER — Other Ambulatory Visit: Payer: Self-pay

## 2017-11-01 VITALS — BP 145/74 | HR 88 | Ht 75.0 in | Wt 228.1 lb

## 2017-11-01 DIAGNOSIS — R972 Elevated prostate specific antigen [PSA]: Secondary | ICD-10-CM | POA: Diagnosis not present

## 2017-11-01 DIAGNOSIS — N401 Enlarged prostate with lower urinary tract symptoms: Secondary | ICD-10-CM | POA: Diagnosis not present

## 2017-11-01 MED ORDER — ALFUZOSIN HCL ER 10 MG PO TB24: 10.0000 mg | ORAL_TABLET | Freq: Every day | ORAL | 5 refills | Status: DC

## 2017-11-01 NOTE — Progress Notes (Signed)
11/01/2017 9:37 AM   [name] 02-03-1956 [id number]  Referring provider: Tracie Harrier, MD [address] [facility] [place], [place] [address]  Chief Complaint  Patient presents with  . Elevated PSA    HPI: 61 year old male presents for follow-up of an elevated PSA and lower urinary tract symptoms.  He was seen on 10/04/2017 for a PSA of 6.93 and bothersome lower urinary tract symptoms.  Urinalysis was unremarkable.  He was started on an alpha blocker and states he did note improvement in his voiding pattern.  His insurance preferred alpha blocker was alfuzosin.   PMH: Past Medical History:  Diagnosis Date  . Acute on chronic respiratory failure with hypoxia ([place]) 05/25/2017  . Acute respiratory failure with hypoxia ([place]) 05/25/2017  . COPD (chronic obstructive pulmonary disease) ([place])   . COPD with acute bronchitis ([place]) 05/25/2017  . COPD with acute exacerbation ([place]) 05/25/2017  . Diabetes mellitus without complication ([place])   . Leukocytosis 05/25/2017    Surgical History: Past Surgical History:  Procedure Laterality Date  . TESTICLE SURGERY     undecended testicle repair    Home Medications:  Allergies as of 11/01/2017   No Known Allergies     Medication List        Accurate as of 11/01/17  9:37 AM. Always use your most recent med list.          albuterol 108 (90 Base) MCG/ACT inhaler Commonly known as:  PROVENTIL HFA;VENTOLIN HFA Inhale 2 puffs into the lungs every 6 (six) hours as needed for wheezing or shortness of breath.   alfuzosin 10 MG 24 hr tablet Commonly known as:  UROXATRAL Take 1 tablet (10 mg total) by mouth daily with breakfast.   budesonide-formoterol 160-4.5 MCG/ACT inhaler Commonly known as:  SYMBICORT Inhale 2 puffs into the lungs 2 (two) times daily.   furosemide 20 MG tablet Commonly known as:  LASIX Take 20 mg by mouth.   omeprazole 20 MG capsule Commonly known as:  PRILOSEC Take 20 mg by mouth daily.     sertraline 50 MG tablet Commonly known as:  ZOLOFT Take 50 mg by mouth daily.       Allergies: No Known Allergies  Family History: Family History  Problem Relation Age of Onset  . Stroke Mother   . Breast cancer Mother   . Bladder Cancer Neg Hx   . Kidney cancer Neg Hx   . Prostate cancer Neg Hx     Social History:  reports that he has been smoking cigarettes.  He has been smoking about 0.50 packs per day. he has never used smokeless tobacco. He reports that he does not drink alcohol. His drug history is not on file.  ROS: UROLOGY Frequent Urination?: Yes Hard to postpone urination?: No Burning/pain with urination?: No Get up at night to urinate?: No Leakage of urine?: No Urine stream starts and stops?: No Trouble starting stream?: No Do you have to strain to urinate?: No Blood in urine?: No Urinary tract infection?: No Sexually transmitted disease?: No Injury to kidneys or bladder?: No Painful intercourse?: No Weak stream?: No Erection problems?: No Penile pain?: No  Gastrointestinal Nausea?: No Vomiting?: No Indigestion/heartburn?: Yes Diarrhea?: No Constipation?: No  Constitutional Fever: No Night sweats?: No Weight loss?: No Fatigue?: No  Skin Skin rash/lesions?: Yes Itching?: Yes  Eyes Blurred vision?: Yes Double vision?: Yes  Ears/Nose/Throat Sore throat?: No Sinus problems?: No  Hematologic/Lymphatic Swollen glands?: No Easy bruising?: No  Cardiovascular Leg swelling?:  No Chest pain?: No  Respiratory Cough?: No Shortness of breath?: No  Endocrine Excessive thirst?: No  Musculoskeletal Back pain?: No Joint pain?: No  Neurological Headaches?: No Dizziness?: No  Psychologic Depression?: No Anxiety?: No  Physical Exam: BP (!) 145/74 (BP Location: Right Arm, Patient Position: Sitting, Cuff Size: Large)   Pulse 88   Ht 6\' 3"  (1.905 m)   Wt 228 lb 1.6 oz (103.5 kg)   BMI 28.51 kg/m   Constitutional:  Alert and  oriented, No acute distress. HEENT: [place] AT, moist mucus membranes.  Trachea midline, no masses. Cardiovascular: No clubbing, cyanosis, or edema. Respiratory: Normal respiratory effort, no increased work of breathing. GI: Abdomen is soft, nontender, nondistended, no abdominal masses GU: No CVA tenderness.  Skin: No rashes, bruises or suspicious lesions. Lymph: No cervical or inguinal adenopathy. Neurologic: Grossly intact, no focal deficits, moving all 4 extremities. Psychiatric: Normal mood and affect.  Laboratory Data: Lab Results  Component Value Date   WBC 9.4 09/15/2017   HGB 14.3 09/15/2017   HCT 43.1 09/15/2017   MCV 76.7 (L) 09/15/2017   PLT 380 09/15/2017    Lab Results  Component Value Date   CREATININE 1.13 09/15/2017     Lab Results  Component Value Date   HGBA1C 6.6 (H) 05/26/2017    Urinalysis Lab Results  Component Value Date   SPECGRAV <1.005 (L) 10/04/2017   PHUR 5.5 10/04/2017   COLORU Yellow 10/04/2017   APPEARANCEUR Clear 10/04/2017   LEUKOCYTESUR Negative 10/04/2017   PROTEINUR Negative 10/04/2017   GLUCOSEU Negative 10/04/2017   KETONESU Negative 10/04/2017   RBCU Negative 10/04/2017   BILIRUBINUR Negative 10/04/2017   UUROB 0.2 10/04/2017   NITRITE Negative 10/04/2017     Assessment & Plan:    1.  Elevated PSA  PSA repeated today and if it remains elevated have recommended scheduling a prostate biopsy.  Alternatives of surveillance, prostate MRI and additional blood testing were discussed.  Complications of biopsy were discussed including bleeding and infection/sepsis.  2 BPH with lower urinary tract symptoms  Symptomatic improvement on alfuzosin.  He desires to continue and Rx was sent to his pharmacy.    Abbie Sons, Grandfather [address] [place], [place] [address] [phone]

## 2017-11-01 NOTE — Addendum Note (Signed)
Addended by: Leia Alf on: 11/01/2017 11:38 AM   Modules accepted: Orders

## 2017-11-02 LAB — PSA: Prostate Specific Ag, Serum: 7.1 ng/mL — ABNORMAL HIGH (ref 0.0–4.0)

## 2017-11-07 ENCOUNTER — Telehealth: Payer: Self-pay

## 2017-11-07 NOTE — Telephone Encounter (Signed)
Spoke with pt in reference to PSA results and needing a prostate bx. Pt voiced understanding. Bx and results appts made. Reviewed with pt prep instructions with pt. Mailed appts and instructions to pt. Pt voiced understanding.

## 2017-11-07 NOTE — Telephone Encounter (Signed)
-----   Message from Abbie Sons, MD sent at 11/05/2017  9:40 AM EST ----- Repeat PSA remains elevated at 7.1.  Recommend scheduling a prostate biopsy

## 2017-11-22 ENCOUNTER — Other Ambulatory Visit: Payer: Self-pay | Admitting: Urology

## 2017-11-29 ENCOUNTER — Encounter: Payer: Self-pay | Admitting: Urology

## 2017-11-29 ENCOUNTER — Ambulatory Visit (INDEPENDENT_AMBULATORY_CARE_PROVIDER_SITE_OTHER): Payer: Medicare HMO | Admitting: Urology

## 2017-11-29 ENCOUNTER — Other Ambulatory Visit: Payer: Self-pay | Admitting: Urology

## 2017-11-29 VITALS — BP 131/77 | HR 86 | Ht 75.0 in | Wt 233.4 lb

## 2017-11-29 DIAGNOSIS — R972 Elevated prostate specific antigen [PSA]: Secondary | ICD-10-CM | POA: Diagnosis not present

## 2017-11-29 MED ORDER — LEVOFLOXACIN 500 MG PO TABS
500.0000 mg | ORAL_TABLET | Freq: Once | ORAL | Status: AC
  Administered 2017-11-29: 500 mg via ORAL

## 2017-11-29 MED ORDER — GENTAMICIN SULFATE 40 MG/ML IJ SOLN
80.0000 mg | Freq: Once | INTRAMUSCULAR | Status: AC
  Administered 2017-11-29: 80 mg via INTRAMUSCULAR

## 2017-11-29 NOTE — Progress Notes (Signed)
Prostate Biopsy Procedure   Informed consent was obtained after discussing risks/benefits of the procedure.  A time out was performed to ensure correct patient identity.  Pre-Procedure: - Last PSA Level: 7.1 - Gentamicin given prophylactically - Levaquin 500 mg administered PO -Transrectal Ultrasound performed revealing a 68 gm prostate -No significant hypoechoic or median lobe noted  Procedure: - Prostate block performed using 10 cc 1% lidocaine and biopsies taken from sextant areas, a total of 12 under ultrasound guidance.  Post-Procedure: - Patient tolerated the procedure well - He was counseled to seek immediate medical attention if experiences any severe pain, significant bleeding, or fevers - Return in one week to discuss biopsy results

## 2017-12-07 LAB — PATHOLOGY REPORT

## 2017-12-08 ENCOUNTER — Ambulatory Visit: Payer: Self-pay | Admitting: Urology

## 2017-12-13 ENCOUNTER — Ambulatory Visit: Payer: Self-pay | Admitting: Urology

## 2017-12-13 ENCOUNTER — Other Ambulatory Visit: Payer: Self-pay | Admitting: Urology

## 2017-12-14 ENCOUNTER — Ambulatory Visit: Payer: Self-pay

## 2017-12-27 ENCOUNTER — Encounter: Payer: Self-pay | Admitting: Urology

## 2017-12-27 ENCOUNTER — Ambulatory Visit (INDEPENDENT_AMBULATORY_CARE_PROVIDER_SITE_OTHER): Payer: Medicare HMO | Admitting: Urology

## 2017-12-27 VITALS — BP 137/89 | HR 94 | Ht 75.0 in | Wt 234.0 lb

## 2017-12-27 DIAGNOSIS — C61 Malignant neoplasm of prostate: Secondary | ICD-10-CM | POA: Diagnosis not present

## 2017-12-27 NOTE — Progress Notes (Signed)
12/27/2017 12:28 PM   [name] [birth year] [id number]  Referring provider: Tracie Harrier, MD [address] [facility] [place], [place] [address]  Chief Complaint  Patient presents with  . Results    HPI: 62 year old male presents for prostate biopsy follow-up.  Prostate biopsy was performed on 11/29/2017 for a PSA of 7.1.  He had no post biopsy problems and has no complaints today.  His prostate volume was 68 g.  Standard 12 core biopsies were performed.  Pathology: The right lateral mid core and left lateral apical core was positive for Gleason 3+3 adenocarcinoma involving 10% and 6% of submitted tissue respectively.  Biopsies from the right mid and left lateral base showed atypical small acinar proliferation.   PMH: Past Medical History:  Diagnosis Date  . Acute on chronic respiratory failure with hypoxia ([place]) 05/25/2017  . Acute respiratory failure with hypoxia ([place]) 05/25/2017  . COPD (chronic obstructive pulmonary disease) ([place])   . COPD with acute bronchitis ([place]) 05/25/2017  . COPD with acute exacerbation ([place]) 05/25/2017  . Diabetes mellitus without complication ([place])   . Leukocytosis 05/25/2017    Surgical History: Past Surgical History:  Procedure Laterality Date  . TESTICLE SURGERY     undecended testicle repair    Home Medications:  Allergies as of 12/27/2017   No Known Allergies     Medication List        Accurate as of 12/27/17 12:28 PM. Always use your most recent med list.          albuterol 108 (90 Base) MCG/ACT inhaler Commonly known as:  PROVENTIL HFA;VENTOLIN HFA Inhale 2 puffs into the lungs every 6 (six) hours as needed for wheezing or shortness of breath.   alfuzosin 10 MG 24 hr tablet Commonly known as:  UROXATRAL Take 1 tablet (10 mg total) by mouth daily with breakfast.   budesonide-formoterol 160-4.5 MCG/ACT inhaler Commonly known as:  SYMBICORT Inhale 2 puffs into the lungs 2 (two) times daily.   furosemide  20 MG tablet Commonly known as:  LASIX Take 20 mg by mouth.   omeprazole 20 MG capsule Commonly known as:  PRILOSEC Take 20 mg by mouth daily.   sertraline 50 MG tablet Commonly known as:  ZOLOFT Take 50 mg by mouth daily.       Allergies: No Known Allergies  Family History: Family History  Problem Relation Age of Onset  . Stroke Mother   . Breast cancer Mother   . Bladder Cancer Neg Hx   . Kidney cancer Neg Hx   . Prostate cancer Neg Hx     Social History:  reports that he has been smoking cigarettes.  He has been smoking about 0.50 packs per day. he has never used smokeless tobacco. He reports that he does not drink alcohol. His drug history is not on file.  ROS: UROLOGY Frequent Urination?: Yes Hard to postpone urination?: No Burning/pain with urination?: No Get up at night to urinate?: No Leakage of urine?: No Urine stream starts and stops?: No Trouble starting stream?: No Do you have to strain to urinate?: No Blood in urine?: No Urinary tract infection?: No Sexually transmitted disease?: No Injury to kidneys or bladder?: No Painful intercourse?: No Weak stream?: No Erection problems?: No Penile pain?: No  Gastrointestinal Nausea?: No Vomiting?: No Indigestion/heartburn?: Yes Diarrhea?: No Constipation?: No  Constitutional Fever: No Night sweats?: No Weight loss?: No Fatigue?: No  Skin Skin rash/lesions?: Yes Itching?: Yes  Eyes Blurred vision?: Yes Double  vision?: Yes  Ears/Nose/Throat Sore throat?: No Sinus problems?: No  Hematologic/Lymphatic Swollen glands?: No Easy bruising?: No  Cardiovascular Leg swelling?: No Chest pain?: No  Respiratory Cough?: No Shortness of breath?: No  Endocrine Excessive thirst?: No  Musculoskeletal Back pain?: No Joint pain?: No  Neurological Headaches?: No Dizziness?: No  Psychologic Depression?: No Anxiety?: No  Physical Exam: BP 137/89   Pulse 94   Ht 6\' 3"  (1.905 m)   Wt 234  lb (106.1 kg)   BMI 29.25 kg/m   Constitutional:  Alert and oriented, No acute distress.  Laboratory Data: Lab Results  Component Value Date   WBC 9.4 09/15/2017   HGB 14.3 09/15/2017   HCT 43.1 09/15/2017   MCV 76.7 (L) 09/15/2017   PLT 380 09/15/2017    Lab Results  Component Value Date   CREATININE 1.13 09/15/2017    Lab Results  Component Value Date   PSA1 7.1 (H) 11/01/2017    Lab Results  Component Value Date   HGBA1C 6.6 (H) 05/26/2017    Assessment & Plan:  1. T1c adenocarcinoma the prostate, low risk I discussed the prostate cancer diagnosis with Mr. Muhl.  We discussed potential treatment options.  He has significant COPD would not be a good candidate for radical prostatectomy as the risks would most likely outweigh the benefits.  I did discuss radiation modalities.  Based on his pathology he is also a candidate for active surveillance.  We discussed the pros and cons of these options.  At this time he has elected active surveillance.  Greater than 50% of this 15-minute was spent in counseling  Return in about 3 months (around 03/27/2018) for Recheck, PSA.  Abbie Sons, [place] [address] [place], [place] [address] [phone]

## 2017-12-27 NOTE — Progress Notes (Signed)
error 

## 2018-01-31 ENCOUNTER — Other Ambulatory Visit: Payer: Self-pay

## 2018-01-31 MED ORDER — ALFUZOSIN HCL ER 10 MG PO TB24: 10.0000 mg | ORAL_TABLET | Freq: Every day | ORAL | 5 refills | Status: DC

## 2018-03-26 ENCOUNTER — Encounter: Payer: Self-pay | Admitting: Urology

## 2018-03-26 ENCOUNTER — Other Ambulatory Visit: Payer: Medicare HMO

## 2018-03-26 ENCOUNTER — Other Ambulatory Visit: Payer: Self-pay

## 2018-03-26 DIAGNOSIS — C61 Malignant neoplasm of prostate: Secondary | ICD-10-CM

## 2018-03-28 ENCOUNTER — Ambulatory Visit (INDEPENDENT_AMBULATORY_CARE_PROVIDER_SITE_OTHER): Payer: Medicare HMO | Admitting: Urology

## 2018-03-28 ENCOUNTER — Encounter: Payer: Self-pay | Admitting: Urology

## 2018-03-28 VITALS — BP 129/90 | HR 101 | Resp 16 | Ht 75.0 in | Wt 236.8 lb

## 2018-03-28 DIAGNOSIS — R350 Frequency of micturition: Secondary | ICD-10-CM | POA: Diagnosis not present

## 2018-03-28 DIAGNOSIS — C61 Malignant neoplasm of prostate: Secondary | ICD-10-CM | POA: Diagnosis not present

## 2018-03-28 NOTE — Progress Notes (Signed)
03/28/2018 9:47 AM   Ernest [name] 11-11-1956 [id number]  Referring provider: Tracie Harrier, MD [address] [facility] [place], [place] [address]  Chief Complaint  Patient presents with  . Follow-up    HPI: 62 year old male presents for prostate cancer follow-up.  Prostate biopsy performed December 2018 for a PSA of 7.1 was positive for Gleason 3+3 adenocarcinoma from the right lateral mid and left lateral apical course involving 10% and 6% of submitted tissue.  He has significant COPD and elected active surveillance.  He presents for a 14-month follow-up.  His only complaint is urinary frequency and urgency.  He is on alfuzosin.  Denies dysuria or gross hematuria.  Denies flank, abdominal, pelvic or scrotal pain.   PMH: Past Medical History:  Diagnosis Date  . Acute on chronic respiratory failure with hypoxia ([place]) 05/25/2017  . Acute respiratory failure with hypoxia ([place]) 05/25/2017  . COPD (chronic obstructive pulmonary disease) ([place])   . COPD with acute bronchitis ([place]) 05/25/2017  . COPD with acute exacerbation ([place]) 05/25/2017  . Diabetes mellitus without complication ([place])   . Leukocytosis 05/25/2017    Surgical History: Past Surgical History:  Procedure Laterality Date  . TESTICLE SURGERY     undecended testicle repair    Home Medications:  Allergies as of 03/28/2018   No Known Allergies     Medication List        Accurate as of 03/28/18  9:47 AM. Always use your most recent med list.          albuterol 108 (90 Base) MCG/ACT inhaler Commonly known as:  PROVENTIL HFA;VENTOLIN HFA Inhale 2 puffs into the lungs every 6 (six) hours as needed for wheezing or shortness of breath.   alfuzosin 10 MG 24 hr tablet Commonly known as:  UROXATRAL Take 1 tablet (10 mg total) by mouth daily with breakfast.   budesonide-formoterol 160-4.5 MCG/ACT inhaler Commonly known as:  SYMBICORT Inhale 2 puffs into the lungs 2 (two) times daily.   furosemide 20  MG tablet Commonly known as:  LASIX Take 20 mg by mouth.   omeprazole 20 MG capsule Commonly known as:  PRILOSEC Take 20 mg by mouth daily.   sertraline 50 MG tablet Commonly known as:  ZOLOFT Take 50 mg by mouth daily.       Allergies: No Known Allergies  Family History: Family History  Problem Relation Age of Onset  . Stroke Mother   . Breast cancer Mother   . Bladder Cancer Neg Hx   . Kidney cancer Neg Hx   . Prostate cancer Neg Hx     Social History:  reports that he has been smoking cigarettes.  He has been smoking about 0.50 packs per day. He has never used smokeless tobacco. He reports that he does not drink alcohol. His drug history is not on file.  ROS: UROLOGY Frequent Urination?: No Hard to postpone urination?: No Burning/pain with urination?: No Get up at night to urinate?: Yes Leakage of urine?: No Urine stream starts and stops?: No Trouble starting stream?: No Do you have to strain to urinate?: Yes Blood in urine?: No Urinary tract infection?: No Sexually transmitted disease?: Yes Injury to kidneys or bladder?: No Painful intercourse?: No Weak stream?: No Erection problems?: No Penile pain?: No  Gastrointestinal Nausea?: No Vomiting?: No Indigestion/heartburn?: No Diarrhea?: No Constipation?: No  Constitutional Fever: No Night sweats?: Yes Weight loss?: No Fatigue?: No  Skin Skin rash/lesions?: No Itching?: No  Eyes Blurred vision?: Yes Double  vision?: No  Ears/Nose/Throat Sore throat?: No Sinus problems?: Yes  Hematologic/Lymphatic Swollen glands?: No Easy bruising?: No  Cardiovascular Leg swelling?: Yes Chest pain?: No  Respiratory Cough?: No Shortness of breath?: No  Endocrine Excessive thirst?: No  Musculoskeletal Back pain?: Yes Joint pain?: Yes  Neurological Headaches?: No Dizziness?: Yes  Psychologic Depression?: No Anxiety?: No  Physical Exam: BP 129/90   Pulse (!) 101   Resp 16   Ht 6\' 3"   (1.905 m)   Wt 236 lb 12.8 oz (107.4 kg)   SpO2 98%   BMI 29.60 kg/m   Constitutional:  Alert and oriented, No acute distress. HEENT: [place] AT, moist mucus membranes.  Trachea midline, no masses. Cardiovascular: No clubbing, cyanosis, or edema. Respiratory: On oxygen with increased work of breathing but at baseline. GI: Abdomen is soft, nontender, nondistended, no abdominal masses GU: No CVA tenderness.  Prostate 50 g, smooth without nodules Lymph: No cervical or inguinal lymphadenopathy. Skin: No rashes, bruises or suspicious lesions. Neurologic: Grossly intact, no focal deficits, moving all 4 extremities. Psychiatric: Normal mood and affect.  Laboratory Data: Lab Results  Component Value Date   WBC 9.4 09/15/2017   HGB 14.3 09/15/2017   HCT 43.1 09/15/2017   MCV 76.7 (L) 09/15/2017   PLT 380 09/15/2017    Lab Results  Component Value Date   CREATININE 1.13 09/15/2017    Lab Results  Component Value Date   HGBA1C 6.6 (H) 05/26/2017     Assessment & Plan:   62 year old male with low risk prostate cancer on active surveillance.  Stable, benign DRE today.  A PSA was drawn and if stable he desires to continue active surveillance and will follow-up in 3 months.  Continue alfuzosin.  He was given samples of Myrbetriq 50 mg for his urinary frequency.     Abbie Sons, [place] [address], [place], [place] [address] [phone]

## 2018-03-29 LAB — PSA: PROSTATE SPECIFIC AG, SERUM: 8.6 ng/mL — AB (ref 0.0–4.0)

## 2018-04-02 ENCOUNTER — Telehealth: Payer: Self-pay

## 2018-04-02 NOTE — Telephone Encounter (Signed)
Patient notified and previously scheduled for 3 months

## 2018-04-02 NOTE — Telephone Encounter (Signed)
-----   Message from Abbie Sons, MD sent at 04/01/2018  9:55 AM EDT ----- PSA has increased slightly and was 8.6.  Recommend repeating in 3 months.

## 2018-06-25 ENCOUNTER — Other Ambulatory Visit: Payer: Medicare HMO

## 2018-06-25 DIAGNOSIS — C61 Malignant neoplasm of prostate: Secondary | ICD-10-CM

## 2018-06-26 LAB — PSA: PROSTATE SPECIFIC AG, SERUM: 8.7 ng/mL — AB (ref 0.0–4.0)

## 2018-06-28 ENCOUNTER — Encounter: Payer: Self-pay | Admitting: Urology

## 2018-06-28 ENCOUNTER — Ambulatory Visit (INDEPENDENT_AMBULATORY_CARE_PROVIDER_SITE_OTHER): Payer: Medicare HMO | Admitting: Urology

## 2018-06-28 VITALS — BP 116/76 | HR 91 | Resp 16 | Ht 75.0 in | Wt 233.3 lb

## 2018-06-28 DIAGNOSIS — C61 Malignant neoplasm of prostate: Secondary | ICD-10-CM

## 2018-06-28 NOTE — Progress Notes (Signed)
06/28/2018 11:40 AM   [name] 1956-08-07 [id number]  Referring provider: Tracie Harrier, MD [address] [facility] [place], [place] [address]  Chief Complaint  Patient presents with  . 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  He was last seen April 2019 and PSA had increased to 8.6.  He was given a trial of Myrbetriq for urinary frequency which he states was beneficial however is currently doing well on alfuzocin.  A PSA performed earlier this week was 8.7.   PMH: Past Medical History:  Diagnosis Date  . Acute on chronic respiratory failure with hypoxia ([place]) 05/25/2017  . Acute respiratory failure with hypoxia ([place]) 05/25/2017  . COPD (chronic obstructive pulmonary disease) ([place])   . COPD with acute bronchitis ([place]) 05/25/2017  . COPD with acute exacerbation ([place]) 05/25/2017  . Diabetes mellitus without complication ([place])   . Leukocytosis 05/25/2017    Surgical History: Past Surgical History:  Procedure Laterality Date  . TESTICLE SURGERY     undecended testicle repair    Home Medications:  Allergies as of 06/28/2018   No Known Allergies     Medication List        Accurate as of 06/28/18 11:40 AM. Always use your most recent med list.          albuterol 108 (90 Base) MCG/ACT inhaler Commonly known as:  PROVENTIL HFA;VENTOLIN HFA Inhale 2 puffs into the lungs every 6 (six) hours as needed for wheezing or shortness of breath.   alfuzosin 10 MG 24 hr tablet Commonly known as:  UROXATRAL Take 1 tablet (10 mg total) by mouth daily with breakfast.   budesonide-formoterol 160-4.5 MCG/ACT inhaler Commonly  known as:  SYMBICORT Inhale 2 puffs into the lungs 2 (two) times daily.   furosemide 20 MG tablet Commonly known as:  LASIX Take 20 mg by mouth.   omeprazole 20 MG capsule Commonly known as:  PRILOSEC Take 20 mg by mouth daily.   sertraline 50 MG tablet Commonly known as:  ZOLOFT Take 50 mg by mouth daily.       Allergies: No Known Allergies  Family History: Family History  Problem Relation Age of Onset  . Stroke Mother   . Breast cancer Mother   . Bladder Cancer Neg Hx   . Kidney cancer Neg Hx   . Prostate cancer Neg Hx     Social History:  reports that he has been smoking cigarettes.  He has been smoking about 0.50 packs per day. He has never used smokeless tobacco. He reports that he does not drink alcohol. His drug history is not on file.  ROS: UROLOGY Frequent Urination?: No Hard to postpone urination?: No Burning/pain with urination?: No Get up at night to urinate?: Yes Leakage of urine?: Yes Urine stream starts and stops?: No Trouble starting stream?: Yes Do you have to strain to urinate?: No Blood in urine?: No Urinary tract infection?: No Sexually transmitted disease?: Yes Injury to kidneys or bladder?: No Painful intercourse?: No Weak stream?: No Erection problems?: No Penile pain?: Yes  Gastrointestinal Nausea?: No Vomiting?: No Indigestion/heartburn?: No Diarrhea?: No Constipation?: No  Constitutional Fever: No Night sweats?: Yes Weight loss?: No Fatigue?:  No  Skin Skin rash/lesions?: No Itching?: No  Eyes Blurred vision?: Yes Double vision?: Yes  Ears/Nose/Throat Sore throat?: No Sinus problems?: No  Hematologic/Lymphatic Swollen glands?: No Easy bruising?: No  Cardiovascular Leg swelling?: Yes Chest pain?: No  Respiratory Cough?: No Shortness of breath?: No  Endocrine Excessive thirst?: No  Musculoskeletal Back pain?: Yes Joint pain?: No  Neurological Headaches?: No Dizziness?:  No  Psychologic Depression?: No Anxiety?: Yes  Physical Exam: BP 116/76   Pulse 91   Resp 16   Ht 6\' 3"  (1.905 m)   Wt 233 lb 4.8 oz (105.8 kg)   SpO2 96%   BMI 29.16 kg/m   Constitutional:  Alert and oriented, No acute distress. HEENT: [place] AT, moist mucus membranes.  Trachea midline, no masses. Cardiovascular: No clubbing, cyanosis, or edema. Respiratory: Normal respiratory effort, no increased work of breathing. GI: Abdomen is soft, nontender, nondistended, no abdominal masses GU: No CVA tenderness.  Prostate 50 g, smooth without nodules Lymph: No cervical or inguinal lymphadenopathy. Skin: No rashes, bruises or suspicious lesions. Neurologic: Grossly intact, no focal deficits, moving all 4 extremities. Psychiatric: Normal mood and affect.   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  His PSA remains elevated slightly above baseline.  Management options were discussed including early confirmatory biopsy, prostate MRI or referral to radiation oncology for definitive treatment.  He elects to continue active surveillance and have recommended a prostate MRI initially.   Abbie Sons, [place] [address], [place], [place] [address] [phone]

## 2018-07-06 ENCOUNTER — Telehealth: Payer: Self-pay | Admitting: Urology

## 2018-07-06 NOTE — Telephone Encounter (Signed)
Spoke with the patient's insurance today and they stated that according to his plan he probably not be approved for the prostate MRI since he never had a negative biopsy. They are sending the notes over to a provider to review it and to see if they can over ride this. I will keep you posted.   Sharyn Lull

## 2018-07-06 NOTE — Telephone Encounter (Signed)
Noted  

## 2018-07-23 ENCOUNTER — Other Ambulatory Visit: Payer: Self-pay | Admitting: Urology

## 2018-07-23 ENCOUNTER — Ambulatory Visit
Admission: RE | Admit: 2018-07-23 | Discharge: 2018-07-23 | Disposition: A | Discharge status: Home / Self Care | Payer: Medicare HMO | Source: Ambulatory Visit | Attending: Urology | Admitting: Urology

## 2018-07-23 DIAGNOSIS — S0550XA Penetrating wound with foreign body of unspecified eyeball, initial encounter: Secondary | ICD-10-CM

## 2018-07-23 DIAGNOSIS — C61 Malignant neoplasm of prostate: Secondary | ICD-10-CM

## 2018-07-24 NOTE — Progress Notes (Signed)
Ernest [name] was at our office for an MR of his prostate, when he began having difficulty breathing as he was walking down the hall.  He was escorted to the lab where he assembled his own hand-held nebulizer treatment for a suspected asthma attack.  Ernest [name] reports he has advanced COPD and uses oxygen at night only.  After a few minutes using the [facility], Ernest [name] was able to cough up a significant amount of frothy white sputum.  We put him on O2 via [place] at 4L, then up to 6L while he was unable to breathe without coughing.  After learning of his COPD diagnosis, and as the nebulizer treatment was becoming effective, we weaned his O2 down to 2L and then off.  Once stable, all involved agreed to abort the hour-long MRI and have Ernest [name] visit either his pulmonologist or go to the ED at [facility] as needed for respiratory distress.  Of note, Ernest [name] stated he is supposed to use the [facility] every four hours while awake, but he missed his morning treatment for some reason.  Ernest [name] was able to walk out of the building, accompanied by his wife, to drive back to [place].    Dr. Melody Haver Fleming's nurse, Hassan Rowan, aware of situation, and a message was left on Dr. Nicki Reaper Stoioff's office voice mail explaining that the MR was not done today and would need to be rescheduled, preferrbly in a hospital setting per Dr. San Morelle (our radiologist on duty at the time of this incicent).  Gypsy Lore, RN

## 2018-07-25 ENCOUNTER — Encounter (HOSPITAL_COMMUNITY): Payer: Self-pay

## 2018-07-25 ENCOUNTER — Emergency Department (HOSPITAL_COMMUNITY)
Admission: EM | Admit: 2018-07-25 | Discharge: 2018-07-25 | Disposition: A | Discharge status: Home / Self Care | Payer: Medicare HMO | Attending: Emergency Medicine | Admitting: Emergency Medicine

## 2018-07-25 ENCOUNTER — Other Ambulatory Visit: Payer: Self-pay

## 2018-07-25 ENCOUNTER — Emergency Department (HOSPITAL_COMMUNITY): Payer: Medicare HMO

## 2018-07-25 DIAGNOSIS — Z79899 Other long term (current) drug therapy: Secondary | ICD-10-CM | POA: Insufficient documentation

## 2018-07-25 DIAGNOSIS — E119 Type 2 diabetes mellitus without complications: Secondary | ICD-10-CM | POA: Insufficient documentation

## 2018-07-25 DIAGNOSIS — J449 Chronic obstructive pulmonary disease, unspecified: Secondary | ICD-10-CM | POA: Insufficient documentation

## 2018-07-25 DIAGNOSIS — R061 Stridor: Secondary | ICD-10-CM | POA: Insufficient documentation

## 2018-07-25 DIAGNOSIS — F17210 Nicotine dependence, cigarettes, uncomplicated: Secondary | ICD-10-CM | POA: Insufficient documentation

## 2018-07-25 DIAGNOSIS — F419 Anxiety disorder, unspecified: Secondary | ICD-10-CM | POA: Diagnosis not present

## 2018-07-25 DIAGNOSIS — R0600 Dyspnea, unspecified: Secondary | ICD-10-CM | POA: Diagnosis present

## 2018-07-25 LAB — CBC WITH DIFFERENTIAL/PLATELET
BASOS ABS: 0.1 10*3/uL (ref 0.0–0.1)
Basophils Relative: 1 %
EOS PCT: 3 %
Eosinophils Absolute: 0.4 10*3/uL (ref 0.0–0.7)
HEMATOCRIT: 47.3 % (ref 39.0–52.0)
HEMOGLOBIN: 15.6 g/dL (ref 13.0–17.0)
LYMPHS PCT: 36 %
Lymphs Abs: 4.2 10*3/uL — ABNORMAL HIGH (ref 0.7–4.0)
MCH: 25.7 pg — ABNORMAL LOW (ref 26.0–34.0)
MCHC: 33 g/dL (ref 30.0–36.0)
MCV: 78.1 fL (ref 78.0–100.0)
Monocytes Absolute: 0.8 10*3/uL (ref 0.1–1.0)
Monocytes Relative: 7 %
NEUTROS ABS: 6.2 10*3/uL (ref 1.7–7.7)
Neutrophils Relative %: 53 %
Platelets: 581 10*3/uL — ABNORMAL HIGH (ref 150–400)
RBC: 6.06 MIL/uL — AB (ref 4.22–5.81)
RDW: 16.5 % — ABNORMAL HIGH (ref 11.5–15.5)
WBC: 11.6 10*3/uL — AB (ref 4.0–10.5)

## 2018-07-25 LAB — I-STAT TROPONIN, ED: Troponin i, poc: 0.01 ng/mL (ref 0.00–0.08)

## 2018-07-25 LAB — COMPREHENSIVE METABOLIC PANEL
ALBUMIN: 3.7 g/dL (ref 3.5–5.0)
ALK PHOS: 92 U/L (ref 38–126)
ALT: 14 U/L (ref 0–44)
AST: 16 U/L (ref 15–41)
Anion gap: 9 (ref 5–15)
BUN: 13 mg/dL (ref 8–23)
CALCIUM: 9 mg/dL (ref 8.9–10.3)
CHLORIDE: 110 mmol/L (ref 98–111)
CO2: 23 mmol/L (ref 22–32)
CREATININE: 0.89 mg/dL (ref 0.61–1.24)
GFR calc non Af Amer: >60 mL/min (ref >60)
GLUCOSE: 116 mg/dL — AB (ref 70–99)
Potassium: 3.7 mmol/L (ref 3.5–5.1)
SODIUM: 142 mmol/L (ref 135–145)
Total Bilirubin: 0.3 mg/dL (ref 0.3–1.2)
Total Protein: 7.4 g/dL (ref 6.5–8.1)

## 2018-07-25 LAB — BRAIN NATRIURETIC PEPTIDE: B Natriuretic Peptide: 17.2 pg/mL (ref 0.0–100.0)

## 2018-07-25 MED ORDER — DOXYCYCLINE HYCLATE 100 MG PO CAPS: 100.0000 mg | ORAL_CAPSULE | Freq: Two times a day (BID) | ORAL | 0 refills | Status: DC

## 2018-07-25 MED ORDER — METHYLPREDNISOLONE SODIUM SUCC 125 MG IJ SOLR
125.0000 mg | Freq: Once | INTRAMUSCULAR | Status: AC
  Administered 2018-07-25: 125 mg via INTRAVENOUS
  Filled 2018-07-25: qty 2

## 2018-07-25 MED ORDER — RACEPINEPHRINE HCL 2.25 % IN NEBU
INHALATION_SOLUTION | RESPIRATORY_TRACT | Status: AC
  Administered 2018-07-25: 18:00:00
  Filled 2018-07-25: qty 0.5

## 2018-07-25 MED ORDER — LORAZEPAM 2 MG/ML IJ SOLN
1.0000 mg | Freq: Once | INTRAMUSCULAR | Status: AC
  Administered 2018-07-25: 1 mg via INTRAVENOUS
  Filled 2018-07-25: qty 1

## 2018-07-25 MED ORDER — SODIUM CHLORIDE 0.9 % IV BOLUS
500.0000 mL | Freq: Once | INTRAVENOUS | Status: AC
  Administered 2018-07-25: 500 mL via INTRAVENOUS

## 2018-07-25 MED ORDER — LORAZEPAM 0.5 MG PO TABS: ORAL_TABLET | ORAL | 0 refills | Status: DC

## 2018-07-25 MED ORDER — RACEPINEPHRINE HCL 2.25 % IN NEBU
0.5000 mL | INHALATION_SOLUTION | Freq: Once | RESPIRATORY_TRACT | Status: AC
  Administered 2018-07-25: 0.5 mL via RESPIRATORY_TRACT

## 2018-07-25 NOTE — ED Triage Notes (Signed)
Pt comes in unable to speak, pursed lip breathing. MD and RRT called to [facility]

## 2018-07-25 NOTE — Discharge Instructions (Addendum)
Follow-up with your family doctor next week for recheck.  Make sure when he gets short of breath take long deep inhalation and exhalation breaths

## 2018-07-25 NOTE — ED Provider Notes (Signed)
[facility] DEPT Provider Note   CSN: [id number] Arrival date & time: 07/25/18  1746     History   Chief Complaint Chief Complaint  Patient presents with  . Respiratory Distress    HPI Ernest [name] is a 62 y.o. male.  Patient was going to get an MRI today and at the MRI started to get very short of breath he was sent over to the emergency department.  Patient has bad COPD  The history is provided by the patient. No language interpreter was used.  Shortness of Breath  This is a recurrent problem. The problem occurs continuously.The current episode started less than 1 hour ago. The problem has not changed since onset.Pertinent negatives include no fever, no headaches, no cough, no chest pain, no abdominal pain and no rash. Precipitated by: Anxiety. He has tried nothing for the symptoms. The treatment provided no relief. He has had prior hospitalizations. He has had prior ED visits. He has had prior ICU admissions. Associated medical issues do not include asthma.    Past Medical History:  Diagnosis Date  . Acute on chronic respiratory failure with hypoxia ([place]) 05/25/2017  . Acute respiratory failure with hypoxia ([place]) 05/25/2017  . COPD (chronic obstructive pulmonary disease) ([place])   . COPD with acute bronchitis ([place]) 05/25/2017  . COPD with acute exacerbation ([place]) 05/25/2017  . Diabetes mellitus without complication ([place])   . Leukocytosis 05/25/2017    Patient Active Problem List   Diagnosis Date Noted  . Prostate cancer ([place]) 12/27/2017  . Elevated PSA 11/01/2017  . Benign prostatic hyperplasia with lower urinary tract symptoms 11/01/2017  . Acute on chronic respiratory failure with hypoxia ([place]) 05/25/2017  . COPD with acute exacerbation ([place]) 05/25/2017  . COPD with acute bronchitis ([place]) 05/25/2017  . Leukocytosis 05/25/2017  . Acute respiratory failure with hypoxia ([place]) 05/25/2017    Past Surgical History:  Procedure Laterality Date  .  TESTICLE SURGERY     undecended testicle repair        Home Medications    Prior to Admission medications   Medication Sig Start Date End Date Taking? Authorizing Provider  albuterol (PROVENTIL HFA;VENTOLIN HFA) 108 (90 Base) MCG/ACT inhaler Inhale 2 puffs into the lungs every 6 (six) hours as needed for wheezing or shortness of breath.   Yes [provider]  alfuzosin (UROXATRAL) 10 MG 24 hr tablet Take 1 tablet (10 mg total) by mouth daily with breakfast. 01/31/18  Yes Stoioff, Ronda [place], MD  budesonide-formoterol (SYMBICORT) 160-4.5 MCG/ACT inhaler Inhale 2 puffs into the lungs 2 (two) times daily. 05/29/17  Yes Dustin Flock, MD  furosemide (LASIX) 20 MG tablet Take 20 mg by mouth daily.    Yes [provider]  ipratropium-albuterol (DUONEB) 0.5-2.5 (3) MG/3ML SOLN Take 3 mLs by nebulization every 4 (four) hours as needed (SOB, wheezing).   Yes [provider]  sertraline (ZOLOFT) 50 MG tablet Take 50 mg by mouth daily.   Yes [provider]  tiotropium (SPIRIVA) 18 MCG inhalation capsule Place 18 mcg into inhaler and inhale daily.   Yes [provider]  doxycycline (VIBRAMYCIN) 100 MG capsule Take 1 capsule (100 mg total) by mouth 2 (two) times daily. One po bid x 7 days 07/25/18   Milton Ferguson, MD  LORazepam (ATIVAN) 0.5 MG tablet Take 1 every 12 hours as needed for anxiety 07/25/18   Milton Ferguson, MD    Family History Family History  Problem Relation Age of Onset  .  Stroke Mother   . Breast cancer Mother   . Bladder Cancer Neg Hx   . Kidney cancer Neg Hx   . Prostate cancer Neg Hx     Social History Social History   Tobacco Use  . Smoking status: Current Every Day Smoker    Packs/day: 0.50    Types: Cigarettes  . Smokeless tobacco: Never Used  Substance Use Topics  . Alcohol use: No  . Drug use: Not on file     Allergies   Patient has no known allergies.   Review of Systems Review of Systems  Constitutional:  Negative for appetite change, fatigue and fever.  HENT: Negative for congestion, ear discharge and sinus pressure.   Eyes: Negative for discharge.  Respiratory: Positive for shortness of breath. Negative for cough.   Cardiovascular: Negative for chest pain.  Gastrointestinal: Negative for abdominal pain and diarrhea.  Genitourinary: Negative for frequency and hematuria.  Musculoskeletal: Negative for back pain.  Skin: Negative for rash.  Neurological: Negative for seizures and headaches.  Psychiatric/Behavioral: Negative for hallucinations.     Physical Exam Updated Vital Signs BP (!) 136/96   Pulse 90   Resp (!) 28   Ht 6\' 4"  (1.93 m)   Wt 105 kg   SpO2 95%   BMI 28.18 kg/m   Physical Exam  Constitutional: He is oriented to person, place, and time. He appears well-developed.  HENT:  Head: Normocephalic.  Eyes: Conjunctivae are normal.  Neck: No tracheal deviation present.  Cardiovascular:  No murmur heard. Pulmonary/Chest: Stridor present.  Extremely short of breath.  Very tachypneic, patient with significant stridor  Musculoskeletal: Normal range of motion.  Neurological: He is oriented to person, place, and time.  Skin: Skin is warm. There is erythema.  All skin infections and back  Psychiatric: He has a normal mood and affect.     ED Treatments / Results  Labs (all labs ordered are listed, but only abnormal results are displayed) Labs Reviewed  CBC WITH DIFFERENTIAL/PLATELET - Abnormal; Notable for the following components:      Result Value   WBC 11.6 (*)    RBC 6.06 (*)    MCH 25.7 (*)    RDW 16.5 (*)    Platelets 581 (*)    Lymphs Abs 4.2 (*)    All other components within normal limits  COMPREHENSIVE METABOLIC PANEL - Abnormal; Notable for the following components:   Glucose, Bld 116 (*)    All other components within normal limits  I-STAT CHEM 8, ED - Abnormal; Notable for the following components:   Potassium 8.3 (*)    Glucose, Bld 116 (*)     Calcium, Ion 1.01 (*)    All other components within normal limits  BRAIN NATRIURETIC PEPTIDE  I-STAT TROPONIN, ED    EKG EKG Interpretation  Date/Time:  Wednesday July 25 2018 18:08:55 EDT Ventricular Rate:  107 PR Interval:    QRS Duration: 93 QT Interval:  342 QTC Calculation: 457 R Axis:   -19 Text Interpretation:  Sinus tachycardia Probable left atrial enlargement Borderline left axis deviation Consider anterior infarct Confirmed by Milton Ferguson [phone]) on 07/25/2018 9:26:51 PM   Radiology Dg Chest Port 1 View  Result Date: 07/25/2018 CLINICAL DATA:  Shortness of breath EXAM: PORTABLE CHEST 1 VIEW COMPARISON:  CT chest 02/22/2018, radiograph 02/22/2018, 09/15/2017 FINDINGS: Streaky atelectasis or scarring at the bases. No focal opacity or pleural effusion. Emphysematous disease. Normal heart size. No pneumothorax. IMPRESSION: Hyperinflation with emphysematous disease. Linear  scarring or atelectasis at both bases. Electronically Signed   By: Donavan Foil M.D.   On: 07/25/2018 18:15    Procedures Procedures (including critical care time)  Medications Ordered in ED Medications  Racepinephrine HCl 2.25 % nebulizer solution 0.5 mL (0.5 mLs Nebulization Given 07/25/18 1751)  LORazepam (ATIVAN) injection 1 mg (1 mg Intravenous Given 07/25/18 1800)  sodium chloride 0.9 % bolus 500 mL (500 mLs Intravenous New Bag/Given 07/25/18 1806)  methylPREDNISolone sodium succinate (SOLU-MEDROL) 125 mg/2 mL injection 125 mg (125 mg Intravenous Given 07/25/18 1806)  Racepinephrine HCl 2.25 % nebulizer solution (  Given by Other 07/25/18 1807)     Initial Impression / Assessment and Plan / ED Course  I have reviewed the triage vital signs and the nursing notes.  Pertinent labs & imaging results that were available during my care of the patient were reviewed by me and considered in my medical decision making (see chart for details).     CRITICAL CARE Performed by: Milton Ferguson Total  critical care time: 45 minutes Critical care time was exclusive of separately billable procedures and treating other patients. Critical care was necessary to treat or prevent imminent or life-threatening deterioration. Critical care was time spent personally by me on the following activities: development of treatment plan with patient and/or surrogate as well as nursing, discussions with consultants, evaluation of patient's response to treatment, examination of patient, obtaining history from patient or surrogate, ordering and performing treatments and interventions, ordering and review of laboratory studies, ordering and review of radiographic studies, pulse oximetry and re-evaluation of patient's condition.  Patient with significant stridor when he came into the emergency department patient improved significantly with Ativan and racemic epi.  The stridor was only expiratory.  I think there is a lot of anxiety related to this.  Patient also has a rash to his back that his wife stated he is been having some pain.   Patient will be discharged home with doxycycline and Ativan and will follow up with PCP Final Clinical Impressions(s) / ED Diagnoses   Final diagnoses:  Anxiety    ED Discharge Orders         Ordered    doxycycline (VIBRAMYCIN) 100 MG capsule  2 times daily     07/25/18 2130    LORazepam (ATIVAN) 0.5 MG tablet     07/25/18 2130           Milton Ferguson, MD 07/25/18 2135

## 2018-07-25 NOTE — ED Notes (Signed)
Patient eating a sandwich-wife at bedside-NS 500 cc fluid bolus completed

## 2018-07-26 LAB — I-STAT CHEM 8, ED
BUN: 16 mg/dL (ref 8–23)
Calcium, Ion: 1.01 mmol/L — ABNORMAL LOW (ref 1.15–1.40)
Chloride: 108 mmol/L (ref 98–111)
Creatinine, Ser: 0.9 mg/dL (ref 0.61–1.24)
GLUCOSE: 116 mg/dL — AB (ref 70–99)
HCT: 48 % (ref 39.0–52.0)
HEMOGLOBIN: 16.3 g/dL (ref 13.0–17.0)
POTASSIUM: 8.3 mmol/L — AB (ref 3.5–5.1)
SODIUM: 137 mmol/L (ref 135–145)
TCO2: 22 mmol/L (ref 22–32)

## 2018-07-29 ENCOUNTER — Emergency Department: Payer: MEDICARE

## 2018-07-29 ENCOUNTER — Inpatient Hospital Stay
Admission: EM | Admit: 2018-07-29 | Discharge: 2018-07-31 | DRG: 871 | Disposition: A | Discharge status: Home / Self Care | Payer: MEDICARE | Attending: Family Medicine | Admitting: Family Medicine

## 2018-07-29 ENCOUNTER — Inpatient Hospital Stay: Payer: MEDICARE

## 2018-07-29 ENCOUNTER — Other Ambulatory Visit: Payer: Self-pay

## 2018-07-29 DIAGNOSIS — R109 Unspecified abdominal pain: Secondary | ICD-10-CM | POA: Diagnosis not present

## 2018-07-29 DIAGNOSIS — Z7951 Long term (current) use of inhaled steroids: Secondary | ICD-10-CM | POA: Diagnosis not present

## 2018-07-29 DIAGNOSIS — J9601 Acute respiratory failure with hypoxia: Secondary | ICD-10-CM | POA: Diagnosis present

## 2018-07-29 DIAGNOSIS — E119 Type 2 diabetes mellitus without complications: Secondary | ICD-10-CM | POA: Diagnosis not present

## 2018-07-29 DIAGNOSIS — K5900 Constipation, unspecified: Secondary | ICD-10-CM | POA: Diagnosis not present

## 2018-07-29 DIAGNOSIS — N200 Calculus of kidney: Secondary | ICD-10-CM | POA: Diagnosis not present

## 2018-07-29 DIAGNOSIS — Z8546 Personal history of malignant neoplasm of prostate: Secondary | ICD-10-CM

## 2018-07-29 DIAGNOSIS — J440 Chronic obstructive pulmonary disease with acute lower respiratory infection: Secondary | ICD-10-CM | POA: Diagnosis present

## 2018-07-29 DIAGNOSIS — Z9114 Patient's other noncompliance with medication regimen: Secondary | ICD-10-CM | POA: Diagnosis not present

## 2018-07-29 DIAGNOSIS — A419 Sepsis, unspecified organism: Principal | ICD-10-CM | POA: Diagnosis present

## 2018-07-29 DIAGNOSIS — J9600 Acute respiratory failure, unspecified whether with hypoxia or hypercapnia: Secondary | ICD-10-CM | POA: Diagnosis present

## 2018-07-29 DIAGNOSIS — N400 Enlarged prostate without lower urinary tract symptoms: Secondary | ICD-10-CM | POA: Diagnosis present

## 2018-07-29 DIAGNOSIS — R0602 Shortness of breath: Secondary | ICD-10-CM

## 2018-07-29 DIAGNOSIS — F17210 Nicotine dependence, cigarettes, uncomplicated: Secondary | ICD-10-CM | POA: Diagnosis present

## 2018-07-29 DIAGNOSIS — Z79899 Other long term (current) drug therapy: Secondary | ICD-10-CM

## 2018-07-29 DIAGNOSIS — J209 Acute bronchitis, unspecified: Secondary | ICD-10-CM | POA: Diagnosis present

## 2018-07-29 DIAGNOSIS — J441 Chronic obstructive pulmonary disease with (acute) exacerbation: Secondary | ICD-10-CM | POA: Diagnosis present

## 2018-07-29 DIAGNOSIS — Z599 Problem related to housing and economic circumstances, unspecified: Secondary | ICD-10-CM | POA: Diagnosis not present

## 2018-07-29 HISTORY — DX: Malignant neoplasm of prostate: C61

## 2018-07-29 LAB — PROCALCITONIN

## 2018-07-29 LAB — BASIC METABOLIC PANEL
Anion gap: 10 (ref 5–15)
BUN: 13 mg/dL (ref 8–23)
CHLORIDE: 108 mmol/L (ref 98–111)
CO2: 21 mmol/L — AB (ref 22–32)
Calcium: 9.3 mg/dL (ref 8.9–10.3)
Creatinine, Ser: 0.88 mg/dL (ref 0.61–1.24)
GFR calc non Af Amer: >60 mL/min (ref >60)
Glucose, Bld: 120 mg/dL — ABNORMAL HIGH (ref 70–99)
POTASSIUM: 4.2 mmol/L (ref 3.5–5.1)
SODIUM: 139 mmol/L (ref 135–145)

## 2018-07-29 LAB — URINALYSIS, COMPLETE (UACMP) WITH MICROSCOPIC
BACTERIA UA: NONE SEEN
BILIRUBIN URINE: NEGATIVE
GLUCOSE, UA: NEGATIVE mg/dL
Hgb urine dipstick: NEGATIVE
KETONES UR: NEGATIVE mg/dL
LEUKOCYTES UA: NEGATIVE
NITRITE: NEGATIVE
Protein, ur: NEGATIVE mg/dL
SQUAMOUS EPITHELIAL / LPF: NONE SEEN (ref 0–5)
Specific Gravity, Urine: 1.013 (ref 1.005–1.030)
WBC, UA: NONE SEEN WBC/hpf (ref 0–5)
pH: 7 (ref 5.0–8.0)

## 2018-07-29 LAB — CBC
HEMATOCRIT: 47.2 % (ref 40.0–52.0)
Hemoglobin: 15.6 g/dL (ref 13.0–18.0)
MCH: 25.7 pg — AB (ref 26.0–34.0)
MCHC: 33.1 g/dL (ref 32.0–36.0)
MCV: 77.7 fL — ABNORMAL LOW (ref 80.0–100.0)
Platelets: 518 10*3/uL — ABNORMAL HIGH (ref 150–440)
RBC: 6.08 MIL/uL — AB (ref 4.40–5.90)
RDW: 16.2 % — ABNORMAL HIGH (ref 11.5–14.5)
WBC: 12.7 10*3/uL — AB (ref 3.8–10.6)

## 2018-07-29 LAB — TROPONIN I: Troponin I: <0.03 ng/mL (ref <0.03)

## 2018-07-29 LAB — LACTIC ACID, PLASMA: LACTIC ACID, VENOUS: 2.5 mmol/L — AB (ref 0.5–1.9)

## 2018-07-29 LAB — GLUCOSE, CAPILLARY
Glucose-Capillary: 135 mg/dL — ABNORMAL HIGH (ref 70–99)
Glucose-Capillary: 205 mg/dL — ABNORMAL HIGH (ref 70–99)

## 2018-07-29 MED ORDER — IPRATROPIUM-ALBUTEROL 0.5-2.5 (3) MG/3ML IN SOLN: 3.0000 mL | Freq: Four times a day (QID) | RESPIRATORY_TRACT | Status: DC

## 2018-07-29 MED ORDER — METHYLPREDNISOLONE SODIUM SUCC 125 MG IJ SOLR
125.0000 mg | INTRAMUSCULAR | Status: AC
  Administered 2018-07-29: 125 mg via INTRAVENOUS
  Filled 2018-07-29: qty 2

## 2018-07-29 MED ORDER — SODIUM CHLORIDE 0.9 % IV SOLN
INTRAVENOUS | Status: DC
  Administered 2018-07-29: 19:00:00 via INTRAVENOUS

## 2018-07-29 MED ORDER — FUROSEMIDE 20 MG PO TABS
20.0000 mg | ORAL_TABLET | Freq: Every day | ORAL | Status: DC
  Administered 2018-07-30 – 2018-07-31 (×2): 20 mg via ORAL
  Filled 2018-07-29 (×2): qty 1

## 2018-07-29 MED ORDER — ALBUTEROL SULFATE (2.5 MG/3ML) 0.083% IN NEBU: 2.5000 mg | INHALATION_SOLUTION | RESPIRATORY_TRACT | Status: DC | PRN

## 2018-07-29 MED ORDER — METHYLPREDNISOLONE SODIUM SUCC 125 MG IJ SOLR
60.0000 mg | Freq: Four times a day (QID) | INTRAMUSCULAR | Status: DC
  Administered 2018-07-29 – 2018-07-31 (×7): 60 mg via INTRAVENOUS
  Filled 2018-07-29 (×7): qty 2

## 2018-07-29 MED ORDER — PREDNISONE 20 MG PO TABS: 40.0000 mg | ORAL_TABLET | Freq: Every day | ORAL | Status: DC

## 2018-07-29 MED ORDER — SODIUM CHLORIDE 0.9 % IV SOLN
1.0000 g | Freq: Three times a day (TID) | INTRAVENOUS | Status: DC
  Administered 2018-07-29 – 2018-07-30 (×2): 1 g via INTRAVENOUS
  Filled 2018-07-29 (×4): qty 1

## 2018-07-29 MED ORDER — SERTRALINE HCL 50 MG PO TABS
50.0000 mg | ORAL_TABLET | Freq: Every day | ORAL | Status: DC
  Administered 2018-07-29 – 2018-07-31 (×3): 50 mg via ORAL
  Filled 2018-07-29 (×3): qty 1

## 2018-07-29 MED ORDER — INSULIN ASPART 100 UNIT/ML ~~LOC~~ SOLN
0.0000 [IU] | Freq: Every day | SUBCUTANEOUS | Status: DC
  Administered 2018-07-29 – 2018-07-30 (×2): 2 [IU] via SUBCUTANEOUS
  Filled 2018-07-29 (×2): qty 1

## 2018-07-29 MED ORDER — NICOTINE 14 MG/24HR TD PT24
14.0000 mg | MEDICATED_PATCH | Freq: Every day | TRANSDERMAL | Status: DC
  Administered 2018-07-29 – 2018-07-31 (×3): 14 mg via TRANSDERMAL
  Filled 2018-07-29 (×3): qty 1

## 2018-07-29 MED ORDER — SODIUM CHLORIDE 0.9 % IV BOLUS: 1000.0000 mL | Freq: Once | INTRAVENOUS | Status: DC

## 2018-07-29 MED ORDER — IPRATROPIUM-ALBUTEROL 0.5-2.5 (3) MG/3ML IN SOLN
3.0000 mL | Freq: Once | RESPIRATORY_TRACT | Status: AC
  Administered 2018-07-29: 3 mL via RESPIRATORY_TRACT
  Filled 2018-07-29: qty 3

## 2018-07-29 MED ORDER — SODIUM CHLORIDE 0.9 % IV SOLN
500.0000 mg | INTRAVENOUS | Status: DC
  Administered 2018-07-30: 500 mg via INTRAVENOUS
  Filled 2018-07-29 (×2): qty 500

## 2018-07-29 MED ORDER — SODIUM CHLORIDE 0.9 % IV SOLN
1.0000 g | INTRAVENOUS | Status: DC
  Administered 2018-07-30: 1 g via INTRAVENOUS
  Filled 2018-07-29: qty 1
  Filled 2018-07-29: qty 10

## 2018-07-29 MED ORDER — MORPHINE SULFATE (PF) 2 MG/ML IV SOLN: 2.0000 mg | Freq: Four times a day (QID) | INTRAVENOUS | Status: DC | PRN

## 2018-07-29 MED ORDER — ACETAMINOPHEN 325 MG PO TABS: 650.0000 mg | ORAL_TABLET | Freq: Four times a day (QID) | ORAL | Status: DC | PRN

## 2018-07-29 MED ORDER — IPRATROPIUM-ALBUTEROL 0.5-2.5 (3) MG/3ML IN SOLN
3.0000 mL | Freq: Four times a day (QID) | RESPIRATORY_TRACT | Status: DC
  Administered 2018-07-29 – 2018-07-31 (×7): 3 mL via RESPIRATORY_TRACT
  Filled 2018-07-29 (×8): qty 3

## 2018-07-29 MED ORDER — TRAMADOL HCL 50 MG PO TABS
50.0000 mg | ORAL_TABLET | Freq: Four times a day (QID) | ORAL | Status: DC | PRN
  Administered 2018-07-29 – 2018-07-30 (×3): 50 mg via ORAL
  Filled 2018-07-29 (×3): qty 1

## 2018-07-29 MED ORDER — LORAZEPAM 0.5 MG PO TABS: 0.5000 mg | ORAL_TABLET | Freq: Four times a day (QID) | ORAL | Status: DC | PRN

## 2018-07-29 MED ORDER — ALBUTEROL SULFATE (2.5 MG/3ML) 0.083% IN NEBU: 5.0000 mg | INHALATION_SOLUTION | RESPIRATORY_TRACT | Status: AC

## 2018-07-29 MED ORDER — FAMOTIDINE 20 MG PO TABS
20.0000 mg | ORAL_TABLET | Freq: Every day | ORAL | Status: DC
  Administered 2018-07-29 – 2018-07-31 (×3): 20 mg via ORAL
  Filled 2018-07-29 (×3): qty 1

## 2018-07-29 MED ORDER — MOMETASONE FURO-FORMOTEROL FUM 200-5 MCG/ACT IN AERO
1.0000 | INHALATION_SPRAY | Freq: Two times a day (BID) | RESPIRATORY_TRACT | Status: DC
  Administered 2018-07-29 – 2018-07-30 (×2): 1 via RESPIRATORY_TRACT
  Filled 2018-07-29: qty 8.8

## 2018-07-29 MED ORDER — TIOTROPIUM BROMIDE MONOHYDRATE 18 MCG IN CAPS
18.0000 ug | ORAL_CAPSULE | Freq: Every day | RESPIRATORY_TRACT | Status: DC
  Administered 2018-07-30: 18 ug via RESPIRATORY_TRACT
  Filled 2018-07-29: qty 5

## 2018-07-29 MED ORDER — ALBUTEROL SULFATE (2.5 MG/3ML) 0.083% IN NEBU
5.0000 mg | INHALATION_SOLUTION | Freq: Once | RESPIRATORY_TRACT | Status: AC
  Administered 2018-07-29: 5 mg via RESPIRATORY_TRACT

## 2018-07-29 MED ORDER — SODIUM CHLORIDE 0.9 % IV SOLN
2.0000 g | Freq: Once | INTRAVENOUS | Status: AC
  Administered 2018-07-29: 2 g via INTRAVENOUS
  Filled 2018-07-29: qty 20

## 2018-07-29 MED ORDER — INSULIN ASPART 100 UNIT/ML ~~LOC~~ SOLN
0.0000 [IU] | Freq: Three times a day (TID) | SUBCUTANEOUS | Status: DC
  Administered 2018-07-29: 3 [IU] via SUBCUTANEOUS
  Administered 2018-07-30: 2 [IU] via SUBCUTANEOUS
  Administered 2018-07-30 – 2018-07-31 (×2): 3 [IU] via SUBCUTANEOUS
  Administered 2018-07-31: 1 [IU] via SUBCUTANEOUS
  Filled 2018-07-29 (×5): qty 1

## 2018-07-29 MED ORDER — MORPHINE SULFATE (PF) 4 MG/ML IV SOLN
4.0000 mg | Freq: Once | INTRAVENOUS | Status: AC
  Administered 2018-07-29: 4 mg via INTRAVENOUS
  Filled 2018-07-29: qty 1

## 2018-07-29 MED ORDER — SODIUM CHLORIDE 0.9 % IV SOLN
500.0000 mg | Freq: Once | INTRAVENOUS | Status: AC
  Administered 2018-07-29: 500 mg via INTRAVENOUS
  Filled 2018-07-29: qty 500

## 2018-07-29 MED ORDER — ALBUTEROL SULFATE (2.5 MG/3ML) 0.083% IN NEBU
INHALATION_SOLUTION | RESPIRATORY_TRACT | Status: AC
Start: 2018-07-29 — End: 2018-07-30
  Filled 2018-07-29: qty 6

## 2018-07-29 NOTE — Progress Notes (Signed)
   07/29/18 2012  Clinical Encounter Type  Visited With Patient  Visit Type Initial   Provided education on Advance Directives to patient, who verbalized his understanding and had no questions.  Copy of AD paperwork left with patient.

## 2018-07-29 NOTE — Progress Notes (Signed)
Spoke with Dr. Duane Boston pt with a lactic acid level of 2.5. MD wants another lactic acid drawn in 3 hours.

## 2018-07-29 NOTE — ED Notes (Signed)
Pt in with copd - uses nebulizer and inhalers at home. Still smokes. Received duoneb in triage. Also c/o back pain and thinks he may have a kidney stone.

## 2018-07-29 NOTE — ED Triage Notes (Signed)
Patient arrived via POV. Patient presented to first nurse desk in respiratory distress, tachypnea, pursed lip breathing, unable to speak in complete sentences with a constant, harsh cough. Patient c/o lower back pain, states he has a history of kidney stones. Charge RN informed.

## 2018-07-29 NOTE — ED Notes (Signed)
Blue top sent to lab. 

## 2018-07-29 NOTE — H&P (Signed)
[facility] NAME: Ernest [name]    MR#:  [id number]  DATE OF BIRTH:  1956/08/29  DATE OF ADMISSION:  07/29/2018  PRIMARY CARE PHYSICIAN: Tracie Harrier, MD   REQUESTING/REFERRING PHYSICIAN: Delman Kitten, MD  CHIEF COMPLAINT:   SOB HISTORY OF PRESENT ILLNESS:  Ernest [name]  is a 62 y.o. male with a known history of chronic COPD, diabetes metas, prostate cancer sees Dr. Noland Fordyce of urologist as an outpatient , supposed to take alfuzosin, but according to the [facility] patient has never filled the medication, patient reports financial issues and having hard time to fill the medications is presenting to the ED with a chief complaint of shortness of breath.  Patient has been short of breath associated with wheezing which has been much worse today.  Patient reports some cough.  In the ED patient was found to be tachycardic with leukocytosis and septic protocol implemented patient was given Solu-Medrol and antibiotics and hospitalist team is called to admit the patient.  During my examination patient shortness of breath is better but still getting very dyspneic with minimal exertion.  Patient quit smoking but restarted smoking from the previous month.  Denies any fever chest pain  PAST MEDICAL HISTORY:   Past Medical History:  Diagnosis Date  . Acute on chronic respiratory failure with hypoxia ([place]) 05/25/2017  . Acute respiratory failure with hypoxia ([place]) 05/25/2017  . COPD (chronic obstructive pulmonary disease) ([place])   . COPD with acute bronchitis ([place]) 05/25/2017  . COPD with acute exacerbation ([place]) 05/25/2017  . Diabetes mellitus without complication ([place])   . Leukocytosis 05/25/2017  . Prostate cancer ([place])     PAST SURGICAL HISTOIRY:   Past Surgical History:  Procedure Laterality Date  . TESTICLE SURGERY     undecended testicle repair    SOCIAL HISTORY:   Social History   Tobacco Use  . Smoking status: Current Every Day  Smoker    Packs/day: 0.50    Types: Cigarettes  . Smokeless tobacco: Never Used  Substance Use Topics  . Alcohol use: No    FAMILY HISTORY:   Family History  Problem Relation Age of Onset  . Stroke Mother   . Breast cancer Mother   . Bladder Cancer Neg Hx   . Kidney cancer Neg Hx   . Prostate cancer Neg Hx     DRUG ALLERGIES:  No Known Allergies  REVIEW OF SYSTEMS:  CONSTITUTIONAL: No fever, fatigue or weakness.  EYES: No blurred or double vision.  EARS, NOSE, AND THROAT: No tinnitus or ear pain.  RESPIRATORY: Reports cough, shortness of breath, wheezing , no  hemoptysis.  CARDIOVASCULAR: No chest pain, orthopnea, edema.  GASTROINTESTINAL: No nausea, vomiting, diarrhea or abdominal pain.  GENITOURINARY: No dysuria, hematuria.  ENDOCRINE: No polyuria, nocturia,  HEMATOLOGY: No anemia, easy bruising or bleeding SKIN: No rash or lesion. MUSCULOSKELETAL: No joint pain or arthritis.   NEUROLOGIC: No tingling, numbness, weakness.  PSYCHIATRY: No anxiety or depression.   MEDICATIONS AT HOME:   Prior to Admission medications   Medication Sig Start Date End Date Taking? Authorizing Provider  alfuzosin (UROXATRAL) 10 MG 24 hr tablet Take 1 tablet (10 mg total) by mouth daily with breakfast. 01/31/18  Yes Stoioff, Ronda Fairly, MD  budesonide-formoterol (SYMBICORT) 160-4.5 MCG/ACT inhaler Inhale 2 puffs into the lungs 2 (two) times daily. 05/29/17  Yes Dustin Flock, MD  doxycycline (VIBRAMYCIN) 100 MG capsule Take 1 capsule (100 mg total) by mouth 2 (  two) times daily. One po bid x 7 days 07/25/18  Yes Milton Ferguson, MD  LORazepam (ATIVAN) 0.5 MG tablet Take 1 every 12 hours as needed for anxiety 07/25/18  Yes Milton Ferguson, MD  albuterol (PROVENTIL HFA;VENTOLIN HFA) 108 (90 Base) MCG/ACT inhaler Inhale 2 puffs into the lungs every 6 (six) hours as needed for wheezing or shortness of breath.    [provider]  furosemide (LASIX) 20 MG tablet Take 20 mg by mouth daily.      [provider]  ipratropium-albuterol (DUONEB) 0.5-2.5 (3) MG/3ML SOLN Take 3 mLs by nebulization every 4 (four) hours as needed (SOB, wheezing).    [provider]  sertraline (ZOLOFT) 50 MG tablet Take 50 mg by mouth daily.    [provider]  tiotropium (SPIRIVA) 18 MCG inhalation capsule Place 18 mcg into inhaler and inhale daily.    [provider]      VITAL SIGNS:  Blood pressure (!) 138/98, pulse 97, temperature 98.3 F (36.8 C), temperature source Axillary, resp. rate 18, height _0  (1.93 m), weight 104.3 kg, SpO2 92 %.  PHYSICAL EXAMINATION:  GENERAL:  62 y.o.-year-old patient lying in the bed with no acute distress.  EYES: Pupils equal, round, reactive to light and accommodation. No scleral icterus. Extraocular muscles intact.  HEENT: Head atraumatic, normocephalic. Oropharynx and nasopharynx clear.  NECK:  Supple, no jugular venous distention. No thyroid enlargement, no tenderness.  LUNGS: Diminished breath sounds bilaterally, no wheezing, rales,rhonchi or crepitation. No use of accessory muscles of respiration.  CARDIOVASCULAR: S1, S2 normal. No murmurs, rubs, or gallops.  ABDOMEN: Soft, nontender, nondistended. Bowel sounds present. No organomegaly or mass.  EXTREMITIES: No pedal edema, cyanosis, or clubbing.  NEUROLOGIC: Cranial nerves II through XII are intact.  Sensation intact. Gait not checked.  PSYCHIATRIC: The patient is alert and oriented x 3.  SKIN: No obvious rash, lesion, or ulcer.   LABORATORY PANEL:   CBC Recent Labs  Lab 07/29/18 1342  WBC 12.7*  HGB 15.6  HCT 47.2  PLT 518*   ------------------------------------------------------------------------------------------------------------------  Chemistries  Recent Labs  Lab 07/25/18 1901 07/29/18 1342  NA 142 139  K 3.7 4.2  CL 110 108  CO2 23 21*  GLUCOSE 116* 120*  BUN 13 13  CREATININE 0.89 0.88  CALCIUM 9.0 9.3  AST 16  --   ALT 14  --   ALKPHOS 92   --   BILITOT 0.3  --    ------------------------------------------------------------------------------------------------------------------  Cardiac Enzymes Recent Labs  Lab 07/29/18 1342  TROPONINI <0.03   ------------------------------------------------------------------------------------------------------------------  RADIOLOGY:  Dg Chest Port 1 View  Result Date: 07/29/2018 CLINICAL DATA:  Shortness of breath. COPD. Diabetes. Intermittent cough. EXAM: PORTABLE CHEST 1 VIEW COMPARISON:  07/25/2018 FINDINGS: Numerous leads and wires project over the chest. Midline trachea. Normal heart size and mediastinal contours. No pleural effusion or pneumothorax. Bibasilar atelectasis, increased. Diffuse peribronchial thickening. Hyperinflation. IMPRESSION: Hyperinflation and interstitial thickening, most consistent with COPD/chronic bronchitis. Progressive bibasilar atelectasis, without acute process. Electronically Signed   By: Abigail Miyamoto M.D.   On: 07/29/2018 14:56    EKG:   Orders placed or performed during the hospital encounter of 07/29/18  . EKG 12-Lead  . EKG 12-Lead  . ED EKG within 10 minutes  . ED EKG within 10 minutes    IMPRESSION AND PLAN:  Ernest [name]  is a 62 y.o. male with a known history of chronic COPD, diabetes metas, prostate cancer sees Dr. Noland Fordyce of urologist as  an outpatient , supposed to take alfuzosin, but according to the [facility] patient has never filled the medication, patient reports financial issues and having hard time to fill the medications is presenting to the ED with a chief complaint of shortness of breath.  Patient has been short of breath associated with wheezing which has been much worse today.  Patient reports some cough.  In the ED patient was found to be tachycardic with leukocytosis and septic protocol implemented patient was given Solu-Medrol and antibiotics and hospitalist team is called to admit the patient.   #Sepsis met criteria at the time of  admission with leukocytosis and tachycardia Blood cultures, sputum culture and urine culture and sensitivity ordered and patient is started on IV Rocephin and azithromycin  #Acute  hypoxic respiratory failure with hypoxia secondary to acute COPD exacerbation with acute bronchitis Admit to MedSurg floor Solu-Medrol 125 mg IV was given in the ED.  Will continue Solu-Medrol IV Bronchodilator treatments Flutter valve device Rocephin and azithromycin we will de-escalate the antibiotics if the patient's lactic acid and procalcitonin are negative Smoking cessation  #Right flank pain past medical history of renal stones Pain management as needed and IV fluids Patient could not lay flat for CT scan Will get KUB, place a urology consult if needed  #History of prostate cancer Patient home medication list states patient is on alfuzosin, but according to the [facility] patient has never filled the medication.  Will hold off on that for now . patient is reporting financial issues and not feeling his medications Sees urology Dr. Bernardo Heater as an outpatient  #Diabetes mellitus Sliding scale insulin check hemoglobin A1c Not on any home medications  #Tobacco abuse disorder counseled patient to quit smoking for 5 minutes Nicotine patch.  Patient is agreeable   All the records are reviewed and case discussed with ED provider. Management plans discussed with the patient, family and they are in agreement.  CODE STATUS: fc , brother Timothy Grimmer is HCPOA  TOTAL TIME TAKING CARE OF THIS PATIENT: 43  minutes.   Note: This dictation was prepared with Dragon dictation along with smaller phrase technology. Any transcriptional errors that result from this process are unintentional.  Nicholes Mango M.D on 07/29/2018 at 4:04 PM  Between 7am to 6pm - Pager - [phone]  After 6pm go to [email or web address] - password [email or web address] [facility]  Office  [phone]  CC: Primary care physician; Tracie Harrier, MD

## 2018-07-29 NOTE — Progress Notes (Signed)
CODE SEPSIS - PHARMACY COMMUNICATION  **Broad Spectrum Antibiotics should be administered within 1 hour of Sepsis diagnosis**  Time Code Sepsis Called/Page Received: 8/18 @ 1424  Antibiotics Ordered: Azith/CTX  Time of 1st antibiotic administration: 4784  Additional action taken by pharmacy:    If necessary, Name of Provider/Nurse Contacted:      Noralee Space ,PharmD Clinical Pharmacist  07/29/2018  3:09 PM

## 2018-07-29 NOTE — ED Provider Notes (Signed)
[facility] Emergency Department Provider Note   ____________________________________________   First MD Initiated Contact with Patient 07/29/18 1518     (approximate)  I have reviewed the triage vital signs and the nursing notes.   HISTORY  Chief Complaint Shortness of Breath and Back Pain    HPI Ernest [name] is a 62 y.o. male for evaluation of shortness of breath  Patient reports that he had an MRI to evaluate for prostate problems a few days ago but was unable to have it completed because he developed shortness of breath.  He comes in today reports ongoing shortness of breath, difficulty breathing and wheezing.  Patient reports he has COPD and uses oxygen at night, and he is feeling very short of breath now.  He received 1 treatment prior to my evaluation reports is helped slightly but still feeling short of breath  No chest pain.  Reports his right flank is uncomfortable and he is been having pain in this region, was supposed to have an MRI ordered by his urologist but was not able to have it completed due to difficulty breathing  No fevers or chills.  Does report a nonproductive cough" COPD attack"  Past Medical History:  Diagnosis Date  . Acute on chronic respiratory failure with hypoxia ([place]) 05/25/2017  . Acute respiratory failure with hypoxia ([place]) 05/25/2017  . COPD (chronic obstructive pulmonary disease) ([place])   . COPD with acute bronchitis ([place]) 05/25/2017  . COPD with acute exacerbation ([place]) 05/25/2017  . Diabetes mellitus without complication ([place])   . Leukocytosis 05/25/2017  . Prostate cancer [facility])     Patient Active Problem List   Diagnosis Date Noted  . Acute respiratory failure ([place]) 07/29/2018  . Prostate cancer ([place]) 12/27/2017  . Elevated PSA 11/01/2017  . Benign prostatic hyperplasia with lower urinary tract symptoms 11/01/2017  . Acute on chronic respiratory failure with hypoxia ([place]) 05/25/2017  . COPD with acute  exacerbation ([place]) 05/25/2017  . COPD with acute bronchitis ([place]) 05/25/2017  . Leukocytosis 05/25/2017  . Acute respiratory failure with hypoxia ([place]) 05/25/2017    Past Surgical History:  Procedure Laterality Date  . TESTICLE SURGERY     undecended testicle repair    Prior to Admission medications   Medication Sig Start Date End Date Taking? Authorizing Provider  albuterol (PROVENTIL HFA;VENTOLIN HFA) 108 (90 Base) MCG/ACT inhaler Inhale 2 puffs into the lungs every 6 (six) hours as needed for wheezing or shortness of breath.    [provider]  alfuzosin (UROXATRAL) 10 MG 24 hr tablet Take 1 tablet (10 mg total) by mouth daily with breakfast. 01/31/18   Stoioff, Ronda Fairly, MD  budesonide-formoterol (SYMBICORT) 160-4.5 MCG/ACT inhaler Inhale 2 puffs into the lungs 2 (two) times daily. 05/29/17   Dustin Flock, MD  doxycycline (VIBRAMYCIN) 100 MG capsule Take 1 capsule (100 mg total) by mouth 2 (two) times daily. One po bid x 7 days 07/25/18   Milton Ferguson, MD  furosemide (LASIX) 20 MG tablet Take 20 mg by mouth daily.     [provider]  ipratropium-albuterol (DUONEB) 0.5-2.5 (3) MG/3ML SOLN Take 3 mLs by nebulization every 4 (four) hours as needed (SOB, wheezing).    [provider]  LORazepam (ATIVAN) 0.5 MG tablet Take 1 every 12 hours as needed for anxiety 07/25/18   Milton Ferguson, MD  sertraline (ZOLOFT) 50 MG tablet Take 50 mg by mouth daily.    [provider]  tiotropium (SPIRIVA) 18 MCG inhalation capsule  Place 18 mcg into inhaler and inhale daily.    [provider]    Allergies Patient has no known allergies.  Family History  Problem Relation Age of Onset  . Stroke Mother   . Breast cancer Mother   . Bladder Cancer Neg Hx   . Kidney cancer Neg Hx   . Prostate cancer Neg Hx     Social History Social History   Tobacco Use  . Smoking status: Current Every Day Smoker    Packs/day: 0.50    Types: Cigarettes  . Smokeless  tobacco: Never Used  Substance Use Topics  . Alcohol use: No  . Drug use: Not on file    Review of Systems Constitutional: No fever/chills Eyes: No visual changes. ENT: No sore throat. Cardiovascular: Denies chest pain. Respiratory: D see HPI Gastrointestinal: No abdominal pain problems with his prostate and discomfort in his right flank is been ongoing for a few days time and sees urology.  No nausea, no vomiting.  No diarrhea.  No constipation. Genitourinary: Negative for dysuria. Musculoskeletal: Negative for back pain. Skin: Negative for rash. Neurological: Negative for headaches, focal weakness or numbness.    ____________________________________________   PHYSICAL EXAM:  VITAL SIGNS: ED Triage Vitals  Enc Vitals Group     BP 07/29/18 1346 (!) 127/46     Pulse Rate 07/29/18 1346 (!) 126     Resp 07/29/18 1346 (!) 28     Temp 07/29/18 1346 98.3 F (36.8 C)     Temp Source 07/29/18 1346 Axillary     SpO2 07/29/18 1346 96 %     Weight 07/29/18 1341 230 lb (104.3 kg)     Height 07/29/18 1341 6\' 4"  (1.93 m)     Head Circumference --      Peak Flow --      Pain Score 07/29/18 1340 10     Pain Loc --      Pain Edu? --      Excl. in [place]? --     Constitutional: Alert and oriented. Well appearing and in no acute distress. Eyes: Conjunctivae are normal. Head: Atraumatic. Nose: No congestion/rhinnorhea. Mouth/Throat: Mucous membranes are moist. Neck: No stridor.   Cardiovascular: Tachycardia cardia rate, regular rhythm. Grossly normal heart sounds.  Good peripheral circulation. Respiratory: Tachypnea, pursed lips, bilateral prolonged expiratory phase with mild to moderate and expiratory wheezing.  Speaking in phrases.  Fairly consistent cough when taking a deep breath.  Oxygen saturation 90 to 92% on room air.  Use accessory muscles gastrointestinal: Soft and nontender. No distention. Musculoskeletal: No lower extremity tenderness nor edema. Neurologic:  Normal speech  and language. No gross focal neurologic deficits are appreciated.  Skin:  Skin is warm, dry and intact. No rash noted. Psychiatric: Mood and affect are normal. Speech and behavior are normal.  ____________________________________________   LABS (all labs ordered are listed, but only abnormal results are displayed)  Labs Reviewed  BASIC METABOLIC PANEL - Abnormal; Notable for the following components:      Result Value   CO2 21 (*)    Glucose, Bld 120 (*)    All other components within normal limits  CBC - Abnormal; Notable for the following components:   WBC 12.7 (*)    RBC 6.08 (*)    MCV 77.7 (*)    MCH 25.7 (*)    RDW 16.2 (*)    Platelets 518 (*)    All other components within normal limits  CULTURE, BLOOD (ROUTINE X 2)  CULTURE, BLOOD (ROUTINE X 2)  TROPONIN I  LACTIC ACID, PLASMA  LACTIC ACID, PLASMA  URINALYSIS, COMPLETE (UACMP) WITH MICROSCOPIC   ____________________________________________  EKG  Reviewed by me at 1342 Heart rate 124 QRS 80 QT 420 Sinus tachycardia, notable baseline wander. ____________________________________________  RADIOLOGY  Chest x-ray results reviewed by me  IMPRESSION: Hyperinflation and interstitial thickening, most consistent with COPD/chronic bronchitis. Progressive bibasilar atelectasis, without acute process. Electronically Signed   By: Abigail Miyamoto M.D.   On: 07/29/2018 14:56    ____________________________________________   PROCEDURES  Procedure(s) performed: None  Procedures  Critical Care performed: Yes, see critical care note(s)  CRITICAL CARE Performed by: Delman Kitten   Total critical care time: 35 minutes  Critical care time was exclusive of separately billable procedures and treating other patients.  Critical care was necessary to treat or prevent imminent or life-threatening deterioration.  Critical care was time spent personally by me on the following activities: development of treatment plan with  patient and/or surrogate as well as nursing, discussions with consultants, evaluation of patient's response to treatment, examination of patient, obtaining history from patient or surrogate, ordering and performing treatments and interventions, ordering and review of laboratory studies, ordering and review of radiographic studies, pulse oximetry and re-evaluation of patient's condition.  ____________________________________________   INITIAL IMPRESSION / ASSESSMENT AND PLAN / ED COURSE  Pertinent labs & imaging results that were available during my care of the patient were reviewed by me and considered in my medical decision making (see chart for details).  Patient presents for respiratory distress.  Parent likely COPD exacerbation with tachycardia, leukocytosis is noted and code sepsis initiated due to concerns for possible infectious etiology with associated tachycardia and elevated white count, however later did noted that recent steroid use though it does not appear he was discharged on a daily steroid.  He is afebrile, after multiple breathing treatments his symptoms are improving is resting much more comfortably at the time of admission decision.  He does report ongoing right flank pain which is apparently been bothersome for a few days time reports he was unable to have an outpatient MRI to further evaluate but pain is improved after dose of morphine.  Due to the patient's respiratory distress, did not wish to order a CT renal protocol at this time he does appear to be a somewhat chronic issue, rather focusing on primarily his breathing at the time of my initial assessment.  Pain improved with morphine, respirations improved with treatments.  Appears consistent with COPD exacerbation.  Vitals:   07/29/18 1430 07/29/18 1500  BP: (!) 144/93 (!) 138/98  Pulse: (!) 105 97  Resp: 20 18  Temp:    SpO2: 92% 92%    Ongoing care and evaluation plan by the hospitalist service, discussed with Dr.  Margaretmary Eddy      ____________________________________________   FINAL CLINICAL IMPRESSION(S) / ED DIAGNOSES  Final diagnoses:  COPD exacerbation ([place])      NEW MEDICATIONS STARTED DURING THIS VISIT:  New Prescriptions   No medications on file     Note:  This document was prepared using Dragon voice recognition software and may include unintentional dictation errors.     Delman Kitten, MD 07/29/18 1534

## 2018-07-29 NOTE — Progress Notes (Signed)
Family Meeting Note  Advance Directive:yes  Today a meeting took place with the Patient.   The following clinical team members were present during this meeting:MD  The following were discussed:Patient's diagnosis: Sepsis, acute hypoxic respiratory failure, COPD exacerbation, right flank pain, treatment plan of care discussed in detail with the patient.  Patient's other comorbidities including plan of care was discussed, other comorbidities as documented below  Prostate cancer ([place]) 12/27/2017   . Elevated PSA 11/01/2017  . Benign prostatic hyperplasia with lower urinary tract symptoms 11/01/2017  . Acute on chronic respiratory failure with hypoxia ([facility])      Patient's progosis: Unable to determine and Goals for treatment: Full Code Patient's brother Saliou Barnier  is the  healthcare power of attorney Additional follow-up to be provided: Hospitalist  Time spent during discussion:17 min  Nicholes Mango, MD

## 2018-07-29 NOTE — ED Notes (Signed)
Iv site established by iv team and azithromax restarted.

## 2018-07-29 NOTE — ED Notes (Signed)
Antibiotic stopped. Iv is infiltrated and not running.

## 2018-07-29 NOTE — Progress Notes (Signed)
Pt becomes dyspneic after 1 breath on either IS or flutter valve, but it does initiate productive cough. I instructed him to try to do Q4 as tol., even if its only 1 breath.

## 2018-07-30 ENCOUNTER — Inpatient Hospital Stay: Payer: MEDICARE

## 2018-07-30 DIAGNOSIS — J441 Chronic obstructive pulmonary disease with (acute) exacerbation: Secondary | ICD-10-CM | POA: Diagnosis not present

## 2018-07-30 DIAGNOSIS — A419 Sepsis, unspecified organism: Secondary | ICD-10-CM | POA: Diagnosis not present

## 2018-07-30 LAB — COMPREHENSIVE METABOLIC PANEL
ALT: 14 U/L (ref 0–44)
ANION GAP: 10 (ref 5–15)
AST: 24 U/L (ref 15–41)
Albumin: 3.3 g/dL — ABNORMAL LOW (ref 3.5–5.0)
Alkaline Phosphatase: 87 U/L (ref 38–126)
BUN: 16 mg/dL (ref 8–23)
CO2: 19 mmol/L — AB (ref 22–32)
Calcium: 8.6 mg/dL — ABNORMAL LOW (ref 8.9–10.3)
Chloride: 108 mmol/L (ref 98–111)
Creatinine, Ser: 0.75 mg/dL (ref 0.61–1.24)
Glucose, Bld: 208 mg/dL — ABNORMAL HIGH (ref 70–99)
Potassium: 4 mmol/L (ref 3.5–5.1)
SODIUM: 137 mmol/L (ref 135–145)
Total Bilirubin: 0.5 mg/dL (ref 0.3–1.2)
Total Protein: 6.6 g/dL (ref 6.5–8.1)

## 2018-07-30 LAB — CBC WITH DIFFERENTIAL/PLATELET
BASOS ABS: 0 10*3/uL (ref 0–0.1)
BASOS PCT: 0 %
EOS PCT: 0 %
Eosinophils Absolute: 0 10*3/uL (ref 0–0.7)
HCT: 44.3 % (ref 40.0–52.0)
HEMOGLOBIN: 14.2 g/dL (ref 13.0–18.0)
LYMPHS ABS: 1.8 10*3/uL (ref 1.0–3.6)
Lymphocytes Relative: 14 %
MCH: 25.2 pg — AB (ref 26.0–34.0)
MCHC: 32 g/dL (ref 32.0–36.0)
MCV: 78.7 fL — ABNORMAL LOW (ref 80.0–100.0)
Monocytes Absolute: 0.2 10*3/uL (ref 0.2–1.0)
Monocytes Relative: 1 %
NEUTROS PCT: 85 %
Neutro Abs: 10.8 10*3/uL — ABNORMAL HIGH (ref 1.4–6.5)
PLATELETS: 411 10*3/uL (ref 150–440)
RBC: 5.63 MIL/uL (ref 4.40–5.90)
RDW: 16 % — ABNORMAL HIGH (ref 11.5–14.5)
WBC: 12.7 10*3/uL — AB (ref 3.8–10.6)

## 2018-07-30 LAB — GLUCOSE, CAPILLARY
GLUCOSE-CAPILLARY: 153 mg/dL — AB (ref 70–99)
Glucose-Capillary: 134 mg/dL — ABNORMAL HIGH (ref 70–99)
Glucose-Capillary: 126 mg/dL — ABNORMAL HIGH (ref 70–99)
Glucose-Capillary: 202 mg/dL — ABNORMAL HIGH (ref 70–99)

## 2018-07-30 LAB — HEMOGLOBIN A1C
HEMOGLOBIN A1C: 6.4 % — AB (ref 4.8–5.6)
MEAN PLASMA GLUCOSE: 136.98 mg/dL

## 2018-07-30 LAB — LACTIC ACID, PLASMA: LACTIC ACID, VENOUS: 3.1 mmol/L — AB (ref 0.5–1.9)

## 2018-07-30 LAB — PROCALCITONIN

## 2018-07-30 MED ORDER — SODIUM CHLORIDE 0.9 % IV SOLN
INTRAVENOUS | Status: AC
  Administered 2018-07-30 (×2): via INTRAVENOUS

## 2018-07-30 MED ORDER — GUAIFENESIN ER 600 MG PO TB12
600.0000 mg | ORAL_TABLET | Freq: Two times a day (BID) | ORAL | Status: DC
  Administered 2018-07-30 – 2018-07-31 (×3): 600 mg via ORAL
  Filled 2018-07-30 (×3): qty 1

## 2018-07-30 MED ORDER — MORPHINE SULFATE (PF) 2 MG/ML IV SOLN
2.0000 mg | Freq: Once | INTRAVENOUS | Status: AC
  Administered 2018-07-30: 2 mg via INTRAVENOUS
  Filled 2018-07-30: qty 1

## 2018-07-30 MED ORDER — SODIUM CHLORIDE 0.9 % IV BOLUS
500.0000 mL | Freq: Once | INTRAVENOUS | Status: AC
  Administered 2018-07-30: 500 mL via INTRAVENOUS

## 2018-07-30 MED ORDER — LORAZEPAM 1 MG PO TABS: 1.0000 mg | ORAL_TABLET | Freq: Once | ORAL | Status: DC

## 2018-07-30 MED ORDER — DOCUSATE SODIUM 100 MG PO CAPS
200.0000 mg | ORAL_CAPSULE | Freq: Two times a day (BID) | ORAL | Status: DC
  Administered 2018-07-30 – 2018-07-31 (×3): 200 mg via ORAL
  Filled 2018-07-30 (×2): qty 2

## 2018-07-30 MED ORDER — SODIUM CHLORIDE 0.9% FLUSH: 10.0000 mL | INTRAVENOUS | Status: DC | PRN

## 2018-07-30 MED ORDER — SODIUM CHLORIDE 0.9% FLUSH
10.0000 mL | Freq: Two times a day (BID) | INTRAVENOUS | Status: DC
  Administered 2018-07-30 – 2018-07-31 (×2): 10 mL

## 2018-07-30 MED ORDER — ARFORMOTEROL TARTRATE 15 MCG/2ML IN NEBU
15.0000 ug | INHALATION_SOLUTION | Freq: Two times a day (BID) | RESPIRATORY_TRACT | Status: DC
  Administered 2018-07-30 – 2018-07-31 (×2): 15 ug via RESPIRATORY_TRACT
  Filled 2018-07-30 (×3): qty 2

## 2018-07-30 NOTE — Evaluation (Signed)
Physical Therapy Evaluation Patient Details Name: Ernest [name] MRN: [id number] DOB: Jul 30, 1956 Today's Date: 07/30/2018   History of Present Illness  62 y.o. male with a known history of chronic COPD, diabetes, prostate cancer. In the ED pt was tachycardic with leukocytosis; septic protocol implemented. Admitted for acute respiratory failure related to COPD exacerbation, bronchitis.  Clinical Impression  Upon evaluation, patient alert and oriented; follows commands.  Eager for mobility, but significantly limited by SOB/dyspnea with very minimal exertion.  Voices understanding of need for activity pacing and energy conservation, but does not demonstrate full functional awareness/adherence with mobility tasks during session.  Patient strength and ROM grossly symmetrical and WFL for basic transfers and mobility; does endorse feeling slightly "shaky and off balance".  Refuses trial of RW at this time, but agreeable to [facility].  Demonstrates ability to complete sit/stand, basic transfers and short-distance gait (20') with SPC, but constantly reaches for/holds IV pole with other UE (suggesting need/benefit from more supportive assist device).  Very significant SOB with gait efforts; unable to tolerate additional distance/activity this date as result. Discussed potential use of RW for general safety/stability and overall energy conservation; patient hesitant, but agreeable to trial next session. Brief desat to 80% on RA with exertion; quickly recovers to >92% with seated rest period (within 5-10 seconds). Would benefit from skilled PT to address above deficits and promote optimal return to PLOF; Recommend transition to [place] when medically stable upon discharge from acute hospitalization.     Follow Up Recommendations Home health PT    Equipment Recommendations  Rolling [employer] with 5" wheels    Recommendations for Other Services       Precautions / Restrictions Precautions Precautions:  Fall Restrictions Weight Bearing Restrictions: No      Mobility  Bed Mobility Overal bed mobility: Modified Independent             General bed mobility comments: seated in recliner beginning/end of treatment session  Transfers Overall transfer level: Needs assistance Equipment used: Straight cane Transfers: Sit to/from Stand Sit to Stand: Min guard;Supervision            Ambulation/Gait Ambulation/Gait assistance: Min Gaffer (Feet): 20 Feet Assistive device: Straight cane(other hand holding/pushing IV pole)       General Gait Details: reciprocal stepping pattern with fair step height/length, fair balance and overalls stability.  Significantly limited by SOB with even vern minimal exertion; unable to tolerate additional distance as result  Stairs            Wheelchair Mobility    Modified Rankin (Stroke Patients Only)       Balance Overall balance assessment: Needs assistance Sitting-balance support: No upper extremity supported;Feet supported Sitting balance-Leahy Scale: Good     Standing balance support: Bilateral upper extremity supported Standing balance-Leahy Scale: Fair                               Pertinent Vitals/Pain Pain Assessment: Faces Pain Score: 6  Pain Location: posterior RLQ/R kidney Pain Descriptors / Indicators: Aching Pain Intervention(s): Limited activity within patient's tolerance;Monitored during session;Repositioned    Home Living Family/patient expects to be discharged to:: Private residence Living Arrangements: Spouse/significant other;Children Available Help at Discharge: Family;Available 24 hours/day Type of Home: House Home Access: Stairs to enter Entrance Stairs-Rails: None Entrance Stairs-Number of Steps: 5 Home Layout: One level Home Equipment: None Additional Comments: Wife currently hospitalized for BP control    Prior Function  Level of Independence: Independent          Comments: Pt independent with mobility, ADL, becomes short of breath fairly often and is bothered by steam, heavily scented things, etc.; wearing O2 at night, keeps mini nebulizer in the car for emergencies. No falls.     Hand Dominance   Dominant Hand: Right    Extremity/Trunk Assessment   Upper Extremity Assessment Upper Extremity Assessment: Overall WFL for tasks assessed    Lower Extremity Assessment Lower Extremity Assessment: Overall WFL for tasks assessed(grossly at least 4/5 throughout)    Cervical / Trunk Assessment Cervical / Trunk Assessment: Normal  Communication   Communication: No difficulties  Cognition Arousal/Alertness: Awake/alert Behavior During Therapy: WFL for tasks assessed/performed Overall Cognitive Status: Within Functional Limits for tasks assessed                                        General Comments      Exercises Other Exercises Other Exercises: Reinforced role of activity pacing, energy conservation and awareness of signs/symptoms of fatigue.  Patient voices understanding/awareness.  Discussed potential use of RW for general safety/stability and overall energy conservation; patient hesitant, but agreeable to trial next session. Other Exercises: Pt educated in pursed lip breathing with pt able to return demo technique with noted improvement in SOB, per pt report Other Exercises: Pt educated in strategies to improve self mgt of hydration in order to minimize risks associated with dehydration, including flavoring water, setting reminders, building the habit of keeping water/drink with him at all times. Pt endorses "I don't drink enough."   Assessment/Plan    PT Assessment Patient needs continued PT services  PT Problem List Cardiopulmonary status limiting activity;Decreased activity tolerance       PT Treatment Interventions DME instruction;Gait training;Functional mobility training;Stair training;Therapeutic  activities;Therapeutic exercise;Balance training;Patient/family education    PT Goals (Current goals can be found in the Care Plan section)  Acute Rehab PT Goals Patient Stated Goal: to breathe better and go home PT Goal Formulation: With patient Time For Goal Achievement: 08/13/18 Potential to Achieve Goals: Fair    Frequency Min 2X/week   Barriers to discharge        Co-evaluation               AM-PAC PT "6 Clicks" Daily Activity  Outcome Measure Difficulty turning over in bed (including adjusting bedclothes, sheets and blankets)?: None Difficulty moving from lying on back to sitting on the side of the bed? : None Difficulty sitting down on and standing up from a chair with arms (e.g., wheelchair, bedside commode, etc,.)?: A Little Help needed moving to and from a bed to chair (including a wheelchair)?: A Little Help needed walking in hospital room?: A Little Help needed climbing 3-5 steps with a railing? : A Little 6 Click Score: 20    End of Session Equipment Utilized During Treatment: Gait belt Activity Tolerance: Patient tolerated treatment well Patient left: in chair;with call bell/phone within reach;with chair alarm set Nurse Communication: Mobility status PT Visit Diagnosis: Difficulty in walking, not elsewhere classified (R26.2)    Time: 1610-9604 PT Time Calculation (min) (ACUTE ONLY): 16 min   Charges:   PT Evaluation $PT Eval Moderate Complexity: 1 Mod          Turner Kunzman H. Owens Shark, PT, DPT, NCS 07/30/18, 1:39 PM [phone]

## 2018-07-30 NOTE — Progress Notes (Signed)
A code 300 was called overhead for room 240. Patient heard call over head and started running out of the room and with the help of physical therapy, we put patient in wheelchair and took patient to his wife's room.  Patient was placed on 4L O2 and was hyperventilating. Calmed patient down and let patient visit with his wife. Returned patient to room and was able to wean patient off O2. With sats in the high 90's. Talked with patient about being able to control breathing and he needs to focus on his health while he is here. Made MD aware of above.

## 2018-07-30 NOTE — Progress Notes (Signed)
[place] [facility] [place] NAME: Ernest [name]    MR#:  [id number]  DATE OF BIRTH:  Dec 11, 1956  SUBJECTIVE:  CHIEF COMPLAINT:   Chief Complaint  Patient presents with  . Shortness of Breath  . Back Pain  Patient continues to complain of shortness of breath, hypoxia into the 60s-70s range per nursing staff, patient continues to complain of flank pain-for CT scan later today to assess for symptomatic ureteral stone, give Ativan/morphine prior to study this patient has problems laying flat, respiratory therapy to see, add Brovana, may require home oxygen  REVIEW OF SYSTEMS:  CONSTITUTIONAL: No fever, fatigue or weakness.  EYES: No blurred or double vision.  EARS, NOSE, AND THROAT: No tinnitus or ear pain.  RESPIRATORY: No cough, shortness of breath, wheezing or hemoptysis.  CARDIOVASCULAR: No chest pain, orthopnea, edema.  GASTROINTESTINAL: No nausea, vomiting, diarrhea or abdominal pain.  GENITOURINARY: No dysuria, hematuria.  ENDOCRINE: No polyuria, nocturia,  HEMATOLOGY: No anemia, easy bruising or bleeding SKIN: No rash or lesion. MUSCULOSKELETAL: No joint pain or arthritis.   NEUROLOGIC: No tingling, numbness, weakness.  PSYCHIATRY: No anxiety or depression.   ROS  DRUG ALLERGIES:  No Known Allergies  VITALS:  Blood pressure 128/73, pulse 68, temperature (!) 97.5 F (36.4 C), temperature source Oral, resp. rate (!) 22, height 6\' 4"  (1.93 m), weight 104.3 kg, SpO2 99 %.  PHYSICAL EXAMINATION:  GENERAL:  62 y.o.-year-old patient lying in the bed with no acute distress.  EYES: Pupils equal, round, reactive to light and accommodation. No scleral icterus. Extraocular muscles intact.  HEENT: Head atraumatic, normocephalic. Oropharynx and nasopharynx clear.  NECK:  Supple, no jugular venous distention. No thyroid enlargement, no tenderness.  LUNGS: Normal breath sounds bilaterally, no wheezing, rales,rhonchi or crepitation. No use of accessory  muscles of respiration.  CARDIOVASCULAR: S1, S2 normal. No murmurs, rubs, or gallops.  ABDOMEN: Soft, nontender, nondistended. Bowel sounds present. No organomegaly or mass.  EXTREMITIES: No pedal edema, cyanosis, or clubbing.  NEUROLOGIC: Cranial nerves II through XII are intact. Muscle strength 5/5 in all extremities. Sensation intact. Gait not checked.  PSYCHIATRIC: The patient is alert and oriented x 3.  SKIN: No obvious rash, lesion, or ulcer.   Physical Exam LABORATORY PANEL:   CBC Recent Labs  Lab 07/30/18 0417  WBC 12.7*  HGB 14.2  HCT 44.3  PLT 411   ------------------------------------------------------------------------------------------------------------------  Chemistries  Recent Labs  Lab 07/30/18 0224  NA 137  K 4.0  CL 108  CO2 19*  GLUCOSE 208*  BUN 16  CREATININE 0.75  CALCIUM 8.6*  AST 24  ALT 14  ALKPHOS 87  BILITOT 0.5   ------------------------------------------------------------------------------------------------------------------  Cardiac Enzymes Recent Labs  Lab 07/29/18 1342  TROPONINI <0.03   ------------------------------------------------------------------------------------------------------------------  RADIOLOGY:  Dg Abd 1 View  Result Date: 07/29/2018 CLINICAL DATA:  Right-sided flank pain. EXAM: ABDOMEN - 1 VIEW COMPARISON:  None. FINDINGS: The bowel gas pattern is normal. Numerous gallstones outlined the contour of the gallbladder. No definite renal calculi is seen. IMPRESSION: No radiographic evidence of nephrolithiasis, however the renal shadows are largely obscured by formed stool. Marked cholelithiasis. Electronically Signed   By: Fidela Salisbury M.D.   On: 07/29/2018 16:32   Dg Chest Port 1 View  Result Date: 07/29/2018 CLINICAL DATA:  Shortness of breath. COPD. Diabetes. Intermittent cough. EXAM: PORTABLE CHEST 1 VIEW COMPARISON:  07/25/2018 FINDINGS: Numerous leads and wires project over the chest. Midline trachea.  Normal heart size and mediastinal  contours. No pleural effusion or pneumothorax. Bibasilar atelectasis, increased. Diffuse peribronchial thickening. Hyperinflation. IMPRESSION: Hyperinflation and interstitial thickening, most consistent with COPD/chronic bronchitis. Progressive bibasilar atelectasis, without acute process. Electronically Signed   By: Abigail Miyamoto M.D.   On: 07/29/2018 14:56    ASSESSMENT AND PLAN:  Ernest [name]  is a 62 y.o. male with a known history of chronic COPD, diabetes metas, prostate cancer sees Dr. Noland Fordyce of urologist as an outpatient , supposed to take alfuzosin, but [facility] patient has never [facility] the medication, patient reports financial issues and having hard time to fill the medications is presenting to the ED with a chief complaint of shortness of breath.  Patient has been short of breath associated with wheezing which has been much worse today.  Patient reports some cough.  In the ED patient was found to be tachycardic with leukocytosis and septic protocol implemented patient was given Solu-Medrol and antibiotics and hospitalist team is called to admit the patient.  *Acute sepsis Resolved, follow-up on cultures  *Acute hypoxic respiratory failure  secondary to acute COPD exacerbation with acute bronchitis Continue IV Solu-Medrol, aggressive pulmonary toilet with bronchodilator therapy, respiratory therapy to see, continue empiric Rocephin/azithromycin, add Brovana, inhaled corticosteroids twice daily, mucolytic agents, and continue flutter valve   *Chronic tobacco smoking abuse/dependency  Nicotine patch and cessation counseling ordered   *Acute right flank pain Noted history of renal stones Check CT of abdomen for evaluation of symptomatic ureteral stone, will give Ativan/morphine prior to procedure as patient does have problems with laying flat Urinalysis was unimpressive, KUB noted for cholelithiasis  *Hx of prostate cancer Stable Continue  alfuzosin - according to [facility] patient has never filled the medication Will need to follow-up with urology/Dr. Bernardo Heater as an outpatient for continued care/medical management Patient does have financial constraints which makes affording medication difficult  *Diabetes mellitus Stable on current regiment   *Acute constipation Colace twice daily   All the records are reviewed and case discussed with Care Management/Social Workerr. Management plans discussed with the patient, family and they are in agreement.  CODE STATUS: full  TOTAL TIME TAKING CARE OF THIS PATIENT: 35 minutes.     POSSIBLE D/C IN 1-2 DAYS, DEPENDING ON CLINICAL CONDITION.   Avel Peace Salary M.D on 07/30/2018   Between 7am to 6pm - Pager - [phone]  After 6pm go to [email or web address] - password [email or web address] [facility]  Office  [phone]  CC: Primary care physician; Tracie Harrier, MD  Note: This dictation was prepared with Dragon dictation along with smaller phrase technology. Any transcriptional errors that result from this process are unintentional.

## 2018-07-30 NOTE — Progress Notes (Signed)
Nutrition Brief Note  RD received consult for assessment per COPD Gold Protocol.  Wt Readings from Last 15 Encounters:  07/29/18 104.3 kg  07/25/18 105 kg  06/28/18 105.8 kg  03/28/18 107.4 kg  12/27/17 106.1 kg  11/29/17 105.9 kg  11/01/17 103.5 kg  10/04/17 105.2 kg  09/14/17 106.1 kg  05/29/17 100.5 kg  12/22/15 96.2 kg   Met with patient at bedside. He reports he has a good appetite and intake that is unchanged from baseline. He usually eats 2 meals per day and snacks between meals. He reports he is weight-stable at 228-230 lbs (103.6-104.5 kg). He verbalizes understanding of carbohydrate modified diet for diabetes. No questions or nutrition concerns at this time.  No subcutaneous fat or muscle wasting found on NFPE. Patient does not meet criteria for malnutrition at this time.  Body mass index is 28 kg/m. Patient meets criteria for overweight based on current BMI.   Current diet order is Carbohydrate Modified, patient is consuming approximately 100% of meals at this time. Labs and medications reviewed.   No nutrition interventions warranted at this time. If nutrition issues arise, please consult RD.   Willey Blade, MS, [place], LDN Office: [phone] Pager: [phone] After Hours/Weekend Pager: [phone]

## 2018-07-30 NOTE — Evaluation (Signed)
Occupational Therapy Evaluation Patient Details Name: Ernest [name] MRN: [id number] DOB: September 12, 1956 Today's Date: 07/30/2018    History of Present Illness 62 y.o. male with a known history of chronic COPD, diabetes metas, prostate cancer. In the ED pt was tachycardic with leukocytosis; septic protocol implemented. Admitted for acute respiratory failure.   Clinical Impression   Pt seen for OT evaluation this date. Pt was independent in all ADLs and mobility, living in a 1 story home with his spouse, son, daughter in law, and 58yo grandson. Pt on 2 liters of O2 at home at night only. Pt reports becoming easily fatigued or out of breath with minimize exertion, requiring occasional assist for IADL and unable to play with his grandson as he would like (e.g., push him on his new bike). Pt currently requires PRN minimal assist for LB ADL and CGA for mobility due to poor activity tolerance, impaired balance (pt reports feeling shaky from medication, RN aware), decreased strength, and cardiopulmonary status. Pt visibly SOB at times during session but noted improvement after instruction and cues to utilize learned pursed lip breathing techniques during session. HR up to 107 with mobility in room, O2 sats >97% on RA. Pt educated in energy conservation conservation strategies including pursed lip breathing, activity pacing, home/routines modifications, work simplification, AE/DME, prioritizing of meaningful occupations, and falls prevention. Handout provided. Pt also educated in strategies to support improved hydration t/o the day (see below for detail), as pt endorses this likely contributed to his current kidney stone, which is causing him 8/10 pain. Pt verbalized understanding but would benefit from additional skilled OT services to maximize recall and carryover of learned techniques and facilitate implementation of learned techniques into daily routines. Upon discharge, recommend HHOT services and assist from family  as needed.     Follow Up Recommendations  Home health OT    Equipment Recommendations  Toilet riser;Tub/shower bench    Recommendations for Other Services       Precautions / Restrictions Precautions Precautions: Fall Restrictions Weight Bearing Restrictions: No      Mobility Bed Mobility Overal bed mobility: Modified Independent             General bed mobility comments: additional time/effort to perform, pt sitting up EOB at start of session and end of session w/ CNA  Transfers Overall transfer level: Needs assistance Equipment used: 1 person hand held assist Transfers: Sit to/from Stand Sit to Stand: Min guard              Balance Overall balance assessment: Needs assistance Sitting-balance support: No upper extremity supported;Feet supported Sitting balance-Leahy Scale: Good     Standing balance support: Single extremity supported Standing balance-Leahy Scale: Fair                             ADL either performed or assessed with clinical judgement   ADL Overall ADL's : Needs assistance/impaired         Upper Body Bathing: Sitting;Minimal assistance   Lower Body Bathing: Sit to/from stand;Minimal assistance   Upper Body Dressing : Sitting;Supervision/safety   Lower Body Dressing: Sit to/from stand;Min guard   Toilet Transfer: Min guard;Ambulation           Functional mobility during ADLs: Min guard(HHA/pt holding onto IV pole)       Vision Baseline Vision/History: Wears glasses Wears Glasses: Reading only Patient Visual Report: No change from baseline       Perception  Praxis      Pertinent Vitals/Pain Pain Assessment: 0-10 Pain Score: 8  Pain Location: posterior RLQ/R kidney Pain Descriptors / Indicators: Aching Pain Intervention(s): Limited activity within patient's tolerance;Monitored during session;Premedicated before session     Hand Dominance Right   Extremity/Trunk Assessment Upper Extremity  Assessment Upper Extremity Assessment: Generalized weakness(grossly 4-/5 bilaterally)   Lower Extremity Assessment Lower Extremity Assessment: Generalized weakness;Defer to PT evaluation   Cervical / Trunk Assessment Cervical / Trunk Assessment: Normal   Communication Communication Communication: No difficulties   Cognition Arousal/Alertness: Awake/alert Behavior During Therapy: WFL for tasks assessed/performed Overall Cognitive Status: Within Functional Limits for tasks assessed                                     General Comments       Exercises Other Exercises Other Exercises: Pt educated in energy conservation strategies including activity pacing, work simplification, AE/DME, and falls prevention to minimize SOB and maximize safety and independence. Handout provided. Pt would benefit from additional instruction to support carryover into daily routines.  Other Exercises: Pt educated in pursed lip breathing with pt able to return demo technique with noted improvement in SOB, per pt report Other Exercises: Pt educated in strategies to improve self mgt of hydration in order to minimize risks associated with dehydration, including flavoring water, setting reminders, building the habit of keeping water/drink with him at all times. Pt endorses "I don't drink enough."   Shoulder Instructions      Home Living Family/patient expects to be discharged to:: Private residence Living Arrangements: Spouse/significant other;Children(spouse, son, daughter in law, and 26yo grandson) Available Help at Discharge: Family;Available 24 hours/day;Available PRN/intermittently Type of Home: House Home Access: Stairs to enter [employer] of Steps: 5 Entrance Stairs-Rails: None Home Layout: One level     Bathroom Shower/Tub: Teacher, early years/pre: Standard     Home Equipment: None          Prior Functioning/Environment Level of Independence: Independent         Comments: Pt independent with mobility, ADL, becomes short of breath fairly often and is bothered by steam, heavily scented things, etc.; wearing O2 at night, keeps mini nebulizer in the car for emergencies. No falls.        OT Problem List: Decreased strength;Decreased knowledge of use of DME or AE;Decreased activity tolerance;Cardiopulmonary status limiting activity;Impaired balance (sitting and/or standing)      OT Treatment/Interventions: Self-care/ADL training;Balance training;Therapeutic exercise;Therapeutic activities;Energy conservation;DME and/or AE instruction;Patient/family education    OT Goals(Current goals can be found in the care plan section) Acute Rehab OT Goals Patient Stated Goal: to breathe better and go home OT Goal Formulation: With patient Time For Goal Achievement: 08/13/18 Potential to Achieve Goals: Good ADL Goals Pt Will Perform Lower Body Dressing: with supervision;sit to/from stand(+ time, AE as needed, no SOB) Pt Will Transfer to Toilet: with supervision;ambulating(short distances, LRAD for amb, using PLB) Additional ADL Goal #1: Pt will perform ADL and mobility tasks utilizing PLB with <10% verbal cues to use. Additional ADL Goal #2: Pt will verbalize plan to implement at least 1 learned ECS to maximize safety and independence in the home.  OT Frequency: Min 1X/week   Barriers to D/C:            Co-evaluation              AM-PAC PT "6 Clicks" Daily Activity  Outcome Measure Help from another person eating meals?: None Help from another person taking care of personal grooming?: None Help from another person toileting, which includes using toliet, bedpan, or urinal?: A Little Help from another person bathing (including washing, rinsing, drying)?: A Little Help from another person to put on and taking off regular upper body clothing?: None Help from another person to put on and taking off regular lower body clothing?: A Little 6  Click Score: 21   End of Session Equipment Utilized During Treatment: Gait belt  Activity Tolerance: Patient tolerated treatment well Patient left: in bed;with call bell/phone within reach;with nursing/sitter in room  OT Visit Diagnosis: Other abnormalities of gait and mobility (R26.89);Muscle weakness (generalized) (M62.81)                Time: 6222-9798 OT Time Calculation (min): 28 min Charges:  OT General Charges $OT Visit: 1 Visit OT Evaluation $OT Eval Low Complexity: 1 Low OT Treatments $Self Care/Home Management : 8-22 mins $Therapeutic Activity: 8-22 mins  Jeni Salles, MPH, MS, OTR/L ascom [phone] 07/30/18, 10:42 AM

## 2018-07-30 NOTE — Progress Notes (Signed)
Spoke with Dr. Jodell Cipro, pt with a lactic acid of 3.1. MD to place orders.

## 2018-07-30 NOTE — Progress Notes (Signed)
Spoke with RN Roselyn Reef about the patient. The patient has been stuck 3 times this hospital stay. 2 times by vascular access team. Patient is currently getting 2 types of ABT's and Normal Saline. Possible discharge tomorrow. Asked RN if the patient would benefit from a midline, due to PIV's infiltrating. RN Roselyn Reef at this time is planning to consult the MD.

## 2018-07-30 NOTE — Progress Notes (Signed)
Pt alert and oriented. Remaining on room air this shift. Dyspnea on exertion and when speaking. Iv infusing without difficulty. Voiding without difficulty in the urinal.

## 2018-07-30 NOTE — Progress Notes (Signed)
PT Cancellation Note  Patient Details Name: Ulric Salzman MRN: [id number] DOB: 25-Sep-1956   Cancelled Treatment:    Reason Eval/Treat Not Completed: (Consult received and chart reviewed.  Evaluation attempted x2; patient with OT initial attempt, patient care/ADL second attempt. Will continue efforts at later time/date as medically appropriate and available.)   Semaje Kinker H. Owens Shark, PT, DPT, NCS 07/30/18, 10:09 AM [phone]

## 2018-07-30 NOTE — Progress Notes (Signed)
Patient up to bathroom with therapy O2 sats dropped into the 60's. Recovered to 99-100% at rest after one minute.

## 2018-07-31 DIAGNOSIS — J441 Chronic obstructive pulmonary disease with (acute) exacerbation: Secondary | ICD-10-CM | POA: Diagnosis not present

## 2018-07-31 DIAGNOSIS — A419 Sepsis, unspecified organism: Secondary | ICD-10-CM | POA: Diagnosis not present

## 2018-07-31 LAB — URINE CULTURE

## 2018-07-31 LAB — GLUCOSE, CAPILLARY
GLUCOSE-CAPILLARY: 125 mg/dL — AB (ref 70–99)
Glucose-Capillary: 154 mg/dL — ABNORMAL HIGH (ref 70–99)

## 2018-07-31 LAB — HIV ANTIBODY (ROUTINE TESTING W REFLEX): HIV Screen 4th Generation wRfx: NONREACTIVE

## 2018-07-31 LAB — PROCALCITONIN: Procalcitonin: <0.1 ng/mL

## 2018-07-31 MED ORDER — PREDNISONE 50 MG PO TABS: ORAL_TABLET | ORAL | 0 refills | Status: DC

## 2018-07-31 MED ORDER — IPRATROPIUM-ALBUTEROL 0.5-2.5 (3) MG/3ML IN SOLN: 3.0000 mL | RESPIRATORY_TRACT | 0 refills | Status: DC | PRN

## 2018-07-31 MED ORDER — AZITHROMYCIN 250 MG PO TABS: ORAL_TABLET | ORAL | 0 refills | Status: AC

## 2018-07-31 NOTE — Discharge Summary (Signed)
[facility] NAME: Ernest [name]    MR#:  [id number]  DATE OF BIRTH:  Jan 24, 1956  DATE OF ADMISSION:  07/29/2018 ADMITTING PHYSICIAN: Nicholes Mango, MD  DATE OF DISCHARGE: No discharge date for patient encounter.  PRIMARY CARE PHYSICIAN: Tracie Harrier, MD    ADMISSION DIAGNOSIS:  SOB (shortness of breath) [R06.02] COPD exacerbation (HCC) [J44.1] Rt flank pain [R10.9]  DISCHARGE DIAGNOSIS:  Active Problems:   Acute respiratory failure ([place])   SECONDARY DIAGNOSIS:   Past Medical History:  Diagnosis Date  . Acute on chronic respiratory failure with hypoxia ([place]) 05/25/2017  . Acute respiratory failure with hypoxia ([place]) 05/25/2017  . COPD (chronic obstructive pulmonary disease) ([place])   . COPD with acute bronchitis ([place]) 05/25/2017  . COPD with acute exacerbation ([place]) 05/25/2017  . Diabetes mellitus without complication ([place])   . Leukocytosis 05/25/2017  . Prostate cancer [facility])     HOSPITAL COURSE:  JoeWalkeris a1 y.o.malewith a known history of chronic COPD, diabetes metas, prostate cancer sees Dr. Noland Fordyce of urologist as an outpatient,supposed to take alfuzosin,but according to the [facility] patient has never filled the medication,patient reports financial issues and having hard time to fill the medications is presenting to the ED with a chief complaint of shortness of breath. Patient has been short of breath associated with wheezing which has been much worse today. Patient reports some cough. In the ED patient was found to be tachycardic with leukocytosis and septic protocol implemented patient was given Solu-Medrol and antibiotics and hospitalist team is called to admit the patient.  *Acute sepsis Resolved Treated on our sepsis protocol  *Acute hypoxic respiratory failure  Resolved secondary to acute COPD exacerbation with acute bronchitis Treated with IV Solu-Medrol while in house, provided aggressive  pulmonary toilet with bronchodilator therapy, respiratory therapy did see patient while in house, treated with empiric Rocephin/azithromycin, inhaled corticosteroids twice daily, mucolytic agents, flutter valve, and patient did well   *Chronic tobacco smoking abuse/dependency  Stable Nicotine patch and cessation counseling ordered   *Acute right flank pain Resolved Noted history of renal stones CT abdomen negative for any acute process  Urinalysis was unimpressive, KUB noted for cholelithiasis  *Hx of prostate cancer Stable Continue alfuzosin - according to [facility] patient has never filled the medication Will need to follow-up with urology/Dr. Bernardo Heater as an outpatient for continued care/medical management Patient does have financial constraints which makes affording medication difficult  *Diabetes mellitus Stable on current regiment   *Acute constipation Colace twice daily  DISCHARGE CONDITIONS:   stable  CONSULTS OBTAINED:    DRUG ALLERGIES:  No Known Allergies  DISCHARGE MEDICATIONS:   Allergies as of 07/31/2018   No Known Allergies     Medication List    STOP taking these medications   doxycycline 100 MG capsule Commonly known as:  VIBRAMYCIN     TAKE these medications   albuterol 108 (90 Base) MCG/ACT inhaler Commonly known as:  PROVENTIL HFA;VENTOLIN HFA Inhale 2 puffs into the lungs every 6 (six) hours as needed for wheezing or shortness of breath.   alfuzosin 10 MG 24 hr tablet Commonly known as:  UROXATRAL Take 1 tablet (10 mg total) by mouth daily with breakfast.   azithromycin 250 MG tablet Commonly known as:  ZITHROMAX Take 2 tablets (500 mg) on  Day 1,  followed by 1 tablet (250 mg) once daily on Days 2 through 5.   budesonide-formoterol 160-4.5 MCG/ACT inhaler Commonly known as:  SYMBICORT Inhale  2 puffs into the lungs 2 (two) times daily.   furosemide 20 MG tablet Commonly known as:  LASIX Take 20 mg by mouth daily.    ipratropium-albuterol 0.5-2.5 (3) MG/3ML Soln Commonly known as:  DUONEB Take 3 mLs by nebulization every 4 (four) hours as needed (SOB, wheezing).   LORazepam 0.5 MG tablet Commonly known as:  ATIVAN Take 1 every 12 hours as needed for anxiety   predniSONE 50 MG tablet Commonly known as:  DELTASONE 1 p.o. daily   sertraline 50 MG tablet Commonly known as:  ZOLOFT Take 50 mg by mouth daily.        DISCHARGE INSTRUCTIONS:   If you experience worsening of your admission symptoms, develop shortness of breath, life threatening emergency, suicidal or homicidal thoughts you must seek medical attention immediately by calling 911 or calling your MD immediately  if symptoms less severe.  You Must read complete instructions/literature along with all the possible adverse reactions/side effects for all the Medicines you take and that have been prescribed to you. Take any new Medicines after you have completely understood and accept all the possible adverse reactions/side effects.   Please note  You were cared for by a hospitalist during your hospital stay. If you have any questions about your discharge medications or the care you received while you were in the hospital after you are discharged, you can call the unit and asked to speak with the hospitalist on call if the hospitalist that took care of you is not available. Once you are discharged, your primary care physician will handle any further medical issues. Please note that NO REFILLS for any discharge medications will be authorized once you are discharged, as it is imperative that you return to your primary care physician (or establish a relationship with a primary care physician if you do not have one) for your aftercare needs so that they can reassess your need for medications and monitor your lab values.    Today   CHIEF COMPLAINT:   Chief Complaint  Patient presents with  . Shortness of Breath  . Back Pain    HISTORY OF  PRESENT ILLNESS:  62 y.o. male with a known history of chronic COPD, diabetes metas, prostate cancer sees Dr. Noland Fordyce of urologist as an outpatient , supposed to take alfuzosin, but according to the [facility] patient has never filled the medication, patient reports financial issues and having hard time to fill the medications is presenting to the ED with a chief complaint of shortness of breath.  Patient has been short of breath associated with wheezing which has been much worse today.  Patient reports some cough.  In the ED patient was found to be tachycardic with leukocytosis and septic protocol implemented patient was given Solu-Medrol and antibiotics and hospitalist team is called to admit the patient.  During my examination patient shortness of breath is better but still getting very dyspneic with minimal exertion.  Patient quit smoking but restarted smoking from the previous month.  Denies any fever chest pain VITAL SIGNS:  Blood pressure (!) 142/73, pulse 75, temperature (!) 97.5 F (36.4 C), temperature source Oral, resp. rate 18, height 6\' 4"  (1.93 m), weight 104.3 kg, SpO2 98 %.  I/O:    Intake/Output Summary (Last 24 hours) at 07/31/2018 1116 Last data filed at 07/31/2018 1030 Gross per 24 hour  Intake 2150 ml  Output 2225 ml  Net -75 ml    PHYSICAL EXAMINATION:  GENERAL:  62 y.o.-year-old patient lying in  the bed with no acute distress.  EYES: Pupils equal, round, reactive to light and accommodation. No scleral icterus. Extraocular muscles intact.  HEENT: Head atraumatic, normocephalic. Oropharynx and nasopharynx clear.  NECK:  Supple, no jugular venous distention. No thyroid enlargement, no tenderness.  LUNGS: Normal breath sounds bilaterally, no wheezing, rales,rhonchi or crepitation. No use of accessory muscles of respiration.  CARDIOVASCULAR: S1, S2 normal. No murmurs, rubs, or gallops.  ABDOMEN: Soft, non-tender, non-distended. Bowel sounds present. No organomegaly or mass.   EXTREMITIES: No pedal edema, cyanosis, or clubbing.  NEUROLOGIC: Cranial nerves II through XII are intact. Muscle strength 5/5 in all extremities. Sensation intact. Gait not checked.  PSYCHIATRIC: The patient is alert and oriented x 3.  SKIN: No obvious rash, lesion, or ulcer.   DATA REVIEW:   CBC Recent Labs  Lab 07/30/18 0417  WBC 12.7*  HGB 14.2  HCT 44.3  PLT 411    Chemistries  Recent Labs  Lab 07/30/18 0224  NA 137  K 4.0  CL 108  CO2 19*  GLUCOSE 208*  BUN 16  CREATININE 0.75  CALCIUM 8.6*  AST 24  ALT 14  ALKPHOS 87  BILITOT 0.5    Cardiac Enzymes Recent Labs  Lab 07/29/18 1342  TROPONINI <0.03    Microbiology Results  Results for orders placed or performed during the hospital encounter of 07/29/18  Urine culture     Status: Abnormal   Collection Time: 07/29/18  7:17 PM  Result Value Ref Range Status   Specimen Description   Final    URINE, CLEAN CATCH Performed at [facility], [address], [place], [place] [address]    Special Requests   Final    NONE Performed at [facility], [address] [place], [place] [address]    Culture (A)  Final    <10,000 COLONIES/mL INSIGNIFICANT GROWTH Performed at [facility], [place] [address] [place], [place] [address]    Report Status 07/31/2018 FINAL  Final  Blood Culture (routine x 2)     Status: None (Preliminary result)   Collection Time: 07/29/18  7:27 PM  Result Value Ref Range Status   Specimen Description BLOOD LEFT ANTECUBITAL  Final   Special Requests   Final    BOTTLES DRAWN AEROBIC AND ANAEROBIC Blood Culture adequate volume   Culture   Final    NO GROWTH 2 DAYS Performed at [facility], [address], [place], [place] [address]    Report Status PENDING  Incomplete  Blood Culture (routine x 2)     Status: None (Preliminary result)   Collection Time: 07/29/18  7:27 PM  Result Value Ref Range Status   Specimen Description BLOOD RIGHT WRIST  Final    Special Requests   Final    BOTTLES DRAWN AEROBIC AND ANAEROBIC Blood Culture adequate volume   Culture   Final    NO GROWTH 2 DAYS Performed at [facility], [address] [place], [place] [address]    Report Status PENDING  Incomplete    RADIOLOGY:  Dg Abd 1 View  Result Date: 07/29/2018 CLINICAL DATA:  Right-sided flank pain. EXAM: ABDOMEN - 1 VIEW COMPARISON:  None. FINDINGS: The bowel gas pattern is normal. Numerous gallstones outlined the contour of the gallbladder. No definite renal calculi is seen. IMPRESSION: No radiographic evidence of nephrolithiasis, however the renal shadows are largely obscured by formed stool. Marked cholelithiasis. Electronically Signed   By: Fidela Salisbury M.D.   On:  07/29/2018 16:32   Dg Chest Port 1 View  Result Date: 07/29/2018 CLINICAL DATA:  Shortness of breath. COPD. Diabetes. Intermittent cough. EXAM: PORTABLE CHEST 1 VIEW COMPARISON:  07/25/2018 FINDINGS: Numerous leads and wires project over the chest. Midline trachea. Normal heart size and mediastinal contours. No pleural effusion or pneumothorax. Bibasilar atelectasis, increased. Diffuse peribronchial thickening. Hyperinflation. IMPRESSION: Hyperinflation and interstitial thickening, most consistent with COPD/chronic bronchitis. Progressive bibasilar atelectasis, without acute process. Electronically Signed   By: Abigail Miyamoto M.D.   On: 07/29/2018 14:56   Ct Renal Stone Study  Result Date: 07/30/2018 CLINICAL DATA:  Acute right flank pain. EXAM: CT ABDOMEN AND PELVIS WITHOUT CONTRAST TECHNIQUE: Multidetector CT imaging of the abdomen and pelvis was performed following the standard protocol without IV contrast. COMPARISON:  None. FINDINGS: Lower chest: Mild bilateral posterior basilar subsegmental atelectasis is noted. Hepatobiliary: Cholelithiasis is noted without inflammation. No abnormality is seen in the liver on these unenhanced images. No biliary dilatation is noted. Pancreas:  Unremarkable. No pancreatic ductal dilatation or surrounding inflammatory changes. Spleen: Normal in size without focal abnormality. Adrenals/Urinary Tract: Adrenal glands appear normal. Small nonobstructive right renal calculus is noted. No hydronephrosis or renal obstruction is noted. Urinary bladder is unremarkable. Stomach/Bowel: Stomach is within normal limits. Appendix appears normal. No evidence of bowel wall thickening, distention, or inflammatory changes. Vascular/Lymphatic: No significant vascular findings are present. No enlarged abdominal or pelvic lymph nodes. Reproductive: Mild prostatic enlargement. Other: No abdominal wall hernia or abnormality. No abdominopelvic ascites. Musculoskeletal: No acute or significant osseous findings. IMPRESSION: Nonobstructive right renal calculus. No hydronephrosis or renal obstruction is noted. Cholelithiasis. Mild prostatic enlargement. Electronically Signed   By: Marijo Conception, M.D.   On: 07/30/2018 13:26    EKG:   Orders placed or performed during the hospital encounter of 07/29/18  . EKG 12-Lead  . EKG 12-Lead  . ED EKG within 10 minutes  . ED EKG within 10 minutes      Management plans discussed with the patient, family and they are in agreement.  CODE STATUS:  Code Status History    Date Active Date Inactive Code Status Order ID Comments User Context   09/14/2017 2045 09/16/2017 1528 Full Code [id number]  Vaughan Basta, MD ED   05/25/2017 2257 05/29/2017 1459 Full Code [id number]  Theodoro Grist, MD Inpatient      TOTAL TIME TAKING CARE OF THIS PATIENT: 45 minutes.    Avel Peace Salary M.D on 07/31/2018 at 11:16 AM  Between 7am to 6pm - Pager - [phone]  After 6pm go to [email or web address] - password [email or web address] [facility]  Office  [phone]  CC: Primary care physician; Tracie Harrier, MD   Note: This dictation was prepared with Dragon dictation along with smaller phrase technology. Any  transcriptional errors that result from this process are unintentional.

## 2018-07-31 NOTE — Progress Notes (Signed)
Physical Therapy Treatment Patient Details Name: Ernest [name] MRN: [id number] DOB: 03/26/1956 Today's Date: 07/31/2018    History of Present Illness 62 y.o. male with a known history of chronic COPD, diabetes, prostate cancer. In the ED pt was tachycardic with leukocytosis; septic protocol implemented. Admitted for acute respiratory failure related to COPD exacerbation, bronchitis.    PT Comments    Pt in chair, ready for session, hoping to go home today.  Stood and was able to ambulate around unit x 1 with [place] and mod I/supervision.  O2 on room air 91% on R hand, 98% on L hand.  Pt did seem to get a little tired toward end of gait but he denied fatigue.  Discussed home needs.  Discussed RW vs SPC for energy conservation.  Pt did not seem to want RW upon discharge stating he preferred a cane.  While pt overall did better and would benefit from a RW to allow for improved mobility, he may decline it upon discharge.     Follow Up Recommendations  Home health PT     Equipment Recommendations  Rolling Packard with 5" wheels;Cane    Recommendations for Other Services       Precautions / Restrictions Precautions Precautions: Fall Restrictions Weight Bearing Restrictions: No    Mobility  Bed Mobility               General bed mobility comments: seated in recliner beginning/end of treatment session  Transfers   Equipment used: Rolling [place] (2 wheeled)   Sit to Stand: Modified independent (Device/Increase time)            Ambulation/Gait Ambulation/Gait assistance: Supervision;Modified independent (Device/Increase time) Gait Distance (Feet): 200 Feet Assistive device: Rolling [place] (2 wheeled) Gait Pattern/deviations: Step-through pattern   Gait velocity interpretation: 1.31 - 2.62 ft/sec, indicative of limited community [employer]all[employer]ist [facility]in (Stroke Patients Only)       Balance Overall balance  assessment: Needs assistance Sitting-balance support: No upper extremity supported;Feet supported Sitting balance-Leahy Scale: Good     Standing balance support: Bilateral upper extremity supported Standing balance-Leahy Scale: Fair                              Cognition Arousal/Alertness: Awake/alert Behavior During Therapy: WFL for tasks assessed/performed Overall Cognitive Status: Within Functional Limits for tasks assessed                                        Exercises      General Comments        Pertinent Vitals/Pain Pain Assessment: No/denies pain    Home Living                      Prior Function            PT Goals (current goals can now be found in the care plan section) Progress towards PT goals: Progressing toward goals    Frequency    Min 2X/week      PT Plan Current plan remains appropriate    Co-evaluation              AM-PAC PT "6 Clicks" Daily Activity  Outcome Measure  Difficulty turning over in  bed (including adjusting bedclothes, sheets and blankets)?: None Difficulty moving from lying on back to sitting on the side of the bed? : None Difficulty sitting down on and standing up from a chair with arms (e.g., wheelchair, bedside commode, etc,.)?: None Help needed moving to and from a bed to chair (including a wheelchair)?: None Help needed walking in hospital room?: A Little Help needed climbing 3-5 steps with a railing? : A Little 6 Click Score: 22    End of Session Equipment Utilized During Treatment: Gait belt Activity Tolerance: Patient tolerated treatment well Patient left: in chair;with call bell/phone within reach;with chair alarm set Nurse Communication: Other (comment)       Time: 2111-5520 PT Time Calculation (min) (ACUTE ONLY): 12 min  Charges:  $Gait Training: 8-22 mins                     Chesley Noon, PTA 07/31/18, 9:39 AM

## 2018-08-03 LAB — CULTURE, BLOOD (ROUTINE X 2)
CULTURE: NO GROWTH
Culture: NO GROWTH
Special Requests: ADEQUATE
Special Requests: ADEQUATE

## 2018-08-15 ENCOUNTER — Other Ambulatory Visit: Payer: Self-pay

## 2018-08-30 ENCOUNTER — Telehealth: Payer: Self-pay | Admitting: Urology

## 2018-08-30 NOTE — Telephone Encounter (Signed)
Patient went to [facility] imaging and then to [facility] to have his prostate MRI and was unable to have it done at either place due to his breathing issues. He is on oxygen and this past time is ended up in ICU.  He wants to know what the next step for him is?   Sharyn Lull

## 2018-09-02 NOTE — Telephone Encounter (Signed)
Options would be to schedule a follow-up confirmatory biopsy or refer to radiation oncology for treatment.

## 2018-09-13 NOTE — Telephone Encounter (Signed)
Left patient a message to call back to discuss   Ernest [name]

## 2020-04-29 ENCOUNTER — Other Ambulatory Visit: Payer: Self-pay

## 2020-04-29 ENCOUNTER — Emergency Department: Payer: Medicare HMO

## 2020-04-29 ENCOUNTER — Inpatient Hospital Stay
Admission: EM | Admit: 2020-04-29 | Discharge: 2020-05-02 | DRG: 190 | Disposition: A | Discharge status: Home / Self Care | Payer: Medicare HMO | Attending: Internal Medicine | Admitting: Internal Medicine

## 2020-04-29 DIAGNOSIS — E119 Type 2 diabetes mellitus without complications: Secondary | ICD-10-CM | POA: Diagnosis present

## 2020-04-29 DIAGNOSIS — K219 Gastro-esophageal reflux disease without esophagitis: Secondary | ICD-10-CM | POA: Diagnosis present

## 2020-04-29 DIAGNOSIS — M549 Dorsalgia, unspecified: Secondary | ICD-10-CM | POA: Diagnosis present

## 2020-04-29 DIAGNOSIS — F17210 Nicotine dependence, cigarettes, uncomplicated: Secondary | ICD-10-CM | POA: Diagnosis present

## 2020-04-29 DIAGNOSIS — C61 Malignant neoplasm of prostate: Secondary | ICD-10-CM | POA: Diagnosis present

## 2020-04-29 DIAGNOSIS — J209 Acute bronchitis, unspecified: Secondary | ICD-10-CM | POA: Diagnosis not present

## 2020-04-29 DIAGNOSIS — F329 Major depressive disorder, single episode, unspecified: Secondary | ICD-10-CM | POA: Diagnosis present

## 2020-04-29 DIAGNOSIS — J441 Chronic obstructive pulmonary disease with (acute) exacerbation: Secondary | ICD-10-CM | POA: Diagnosis not present

## 2020-04-29 DIAGNOSIS — T380X5A Adverse effect of glucocorticoids and synthetic analogues, initial encounter: Secondary | ICD-10-CM | POA: Diagnosis present

## 2020-04-29 DIAGNOSIS — G8929 Other chronic pain: Secondary | ICD-10-CM | POA: Diagnosis present

## 2020-04-29 DIAGNOSIS — J9601 Acute respiratory failure with hypoxia: Secondary | ICD-10-CM

## 2020-04-29 DIAGNOSIS — Z20822 Contact with and (suspected) exposure to covid-19: Secondary | ICD-10-CM | POA: Diagnosis present

## 2020-04-29 DIAGNOSIS — Z79891 Long term (current) use of opiate analgesic: Secondary | ICD-10-CM | POA: Diagnosis not present

## 2020-04-29 DIAGNOSIS — Z791 Long term (current) use of non-steroidal anti-inflammatories (NSAID): Secondary | ICD-10-CM

## 2020-04-29 DIAGNOSIS — F419 Anxiety disorder, unspecified: Secondary | ICD-10-CM | POA: Diagnosis present

## 2020-04-29 DIAGNOSIS — N401 Enlarged prostate with lower urinary tract symptoms: Secondary | ICD-10-CM | POA: Diagnosis present

## 2020-04-29 DIAGNOSIS — Z7952 Long term (current) use of systemic steroids: Secondary | ICD-10-CM

## 2020-04-29 DIAGNOSIS — J9600 Acute respiratory failure, unspecified whether with hypoxia or hypercapnia: Secondary | ICD-10-CM | POA: Diagnosis present

## 2020-04-29 DIAGNOSIS — Z79899 Other long term (current) drug therapy: Secondary | ICD-10-CM

## 2020-04-29 DIAGNOSIS — D72829 Elevated white blood cell count, unspecified: Secondary | ICD-10-CM | POA: Diagnosis present

## 2020-04-29 DIAGNOSIS — Z7951 Long term (current) use of inhaled steroids: Secondary | ICD-10-CM

## 2020-04-29 DIAGNOSIS — R0603 Acute respiratory distress: Secondary | ICD-10-CM

## 2020-04-29 DIAGNOSIS — J9621 Acute and chronic respiratory failure with hypoxia: Secondary | ICD-10-CM | POA: Diagnosis present

## 2020-04-29 DIAGNOSIS — R0602 Shortness of breath: Secondary | ICD-10-CM | POA: Diagnosis present

## 2020-04-29 DIAGNOSIS — J440 Chronic obstructive pulmonary disease with acute lower respiratory infection: Secondary | ICD-10-CM | POA: Diagnosis not present

## 2020-04-29 DIAGNOSIS — R059 Cough, unspecified: Secondary | ICD-10-CM

## 2020-04-29 LAB — CBC WITH DIFFERENTIAL/PLATELET
Abs Immature Granulocytes: 0.04 10*3/uL (ref 0.00–0.07)
Basophils Absolute: 0.2 10*3/uL — ABNORMAL HIGH (ref 0.0–0.1)
Basophils Relative: 2 %
Eosinophils Absolute: 0.4 10*3/uL (ref 0.0–0.5)
Eosinophils Relative: 4 %
HCT: 48.5 % (ref 39.0–52.0)
Hemoglobin: 15.7 g/dL (ref 13.0–17.0)
Immature Granulocytes: 0 %
Lymphocytes Relative: 34 %
Lymphs Abs: 3.6 10*3/uL (ref 0.7–4.0)
MCH: 25.3 pg — ABNORMAL LOW (ref 26.0–34.0)
MCHC: 32.4 g/dL (ref 30.0–36.0)
MCV: 78.2 fL — ABNORMAL LOW (ref 80.0–100.0)
Monocytes Absolute: 1.2 10*3/uL — ABNORMAL HIGH (ref 0.1–1.0)
Monocytes Relative: 11 %
Neutro Abs: 5.4 10*3/uL (ref 1.7–7.7)
Neutrophils Relative %: 49 %
Platelets: 398 10*3/uL (ref 150–400)
RBC: 6.2 MIL/uL — ABNORMAL HIGH (ref 4.22–5.81)
RDW: 17.6 % — ABNORMAL HIGH (ref 11.5–15.5)
WBC: 10.7 10*3/uL — ABNORMAL HIGH (ref 4.0–10.5)
nRBC: 0 % (ref 0.0–0.2)

## 2020-04-29 LAB — BASIC METABOLIC PANEL
Anion gap: 12 (ref 5–15)
BUN: 24 mg/dL — ABNORMAL HIGH (ref 8–23)
CO2: 20 mmol/L — ABNORMAL LOW (ref 22–32)
Calcium: 9 mg/dL (ref 8.9–10.3)
Chloride: 106 mmol/L (ref 98–111)
Creatinine, Ser: 1.05 mg/dL (ref 0.61–1.24)
GFR calc Af Amer: >60 mL/min (ref >60)
GFR calc non Af Amer: >60 mL/min (ref >60)
Glucose, Bld: 101 mg/dL — ABNORMAL HIGH (ref 70–99)
Potassium: 3.8 mmol/L (ref 3.5–5.1)
Sodium: 138 mmol/L (ref 135–145)

## 2020-04-29 LAB — BLOOD GAS, VENOUS
Acid-base deficit: 2.4 mmol/L — ABNORMAL HIGH (ref 0.0–2.0)
Bicarbonate: 21.6 mmol/L (ref 20.0–28.0)
O2 Saturation: 95.2 %
Patient temperature: 37
pCO2, Ven: 34 mmHg — ABNORMAL LOW (ref 44.0–60.0)
pH, Ven: 7.41 (ref 7.250–7.430)
pO2, Ven: 76 mmHg — ABNORMAL HIGH (ref 32.0–45.0)

## 2020-04-29 MED ORDER — SODIUM CHLORIDE 0.9 % IV SOLN: INTRAVENOUS | Status: DC

## 2020-04-29 MED ORDER — ALFUZOSIN HCL ER 10 MG PO TB24
10.0000 mg | ORAL_TABLET | Freq: Every day | ORAL | Status: DC
  Administered 2020-04-30 – 2020-05-02 (×2): 10 mg via ORAL
  Filled 2020-04-29 (×4): qty 1

## 2020-04-29 MED ORDER — ONDANSETRON HCL 4 MG/2ML IJ SOLN: 4.0000 mg | Freq: Four times a day (QID) | INTRAMUSCULAR | Status: DC | PRN

## 2020-04-29 MED ORDER — SODIUM CHLORIDE 0.9 % IV SOLN
1.0000 g | INTRAVENOUS | Status: AC
  Administered 2020-04-30 (×2): 1 g via INTRAVENOUS
  Filled 2020-04-29: qty 10
  Filled 2020-04-29: qty 1
  Filled 2020-04-29: qty 10

## 2020-04-29 MED ORDER — HYDROCOD POLST-CPM POLST ER 10-8 MG/5ML PO SUER
5.0000 mL | Freq: Two times a day (BID) | ORAL | Status: DC | PRN
  Administered 2020-04-30: 5 mL via ORAL
  Filled 2020-04-29: qty 5

## 2020-04-29 MED ORDER — MAGNESIUM HYDROXIDE 400 MG/5ML PO SUSP
30.0000 mL | Freq: Every day | ORAL | Status: DC | PRN
  Filled 2020-04-29: qty 30

## 2020-04-29 MED ORDER — SERTRALINE HCL 50 MG PO TABS
50.0000 mg | ORAL_TABLET | Freq: Every day | ORAL | Status: DC
  Administered 2020-04-30 – 2020-05-02 (×3): 50 mg via ORAL
  Filled 2020-04-29 (×3): qty 1

## 2020-04-29 MED ORDER — IPRATROPIUM-ALBUTEROL 0.5-2.5 (3) MG/3ML IN SOLN
3.0000 mL | Freq: Four times a day (QID) | RESPIRATORY_TRACT | Status: DC
  Administered 2020-04-30 – 2020-05-02 (×10): 3 mL via RESPIRATORY_TRACT
  Filled 2020-04-29 (×10): qty 3

## 2020-04-29 MED ORDER — IPRATROPIUM-ALBUTEROL 0.5-2.5 (3) MG/3ML IN SOLN
3.0000 mL | Freq: Once | RESPIRATORY_TRACT | Status: AC
  Administered 2020-04-29: 3 mL via RESPIRATORY_TRACT
  Filled 2020-04-29: qty 6

## 2020-04-29 MED ORDER — METHYLPREDNISOLONE SODIUM SUCC 40 MG IJ SOLR
40.0000 mg | Freq: Three times a day (TID) | INTRAMUSCULAR | Status: DC
  Administered 2020-04-30 (×2): 40 mg via INTRAVENOUS
  Filled 2020-04-29 (×2): qty 1

## 2020-04-29 MED ORDER — IPRATROPIUM-ALBUTEROL 0.5-2.5 (3) MG/3ML IN SOLN
3.0000 mL | Freq: Once | RESPIRATORY_TRACT | Status: AC
  Administered 2020-04-29: 3 mL via RESPIRATORY_TRACT
  Filled 2020-04-29: qty 3

## 2020-04-29 MED ORDER — ONDANSETRON HCL 4 MG PO TABS: 4.0000 mg | ORAL_TABLET | Freq: Four times a day (QID) | ORAL | Status: DC | PRN

## 2020-04-29 MED ORDER — METHYLPREDNISOLONE SODIUM SUCC 125 MG IJ SOLR
125.0000 mg | Freq: Once | INTRAMUSCULAR | Status: AC
  Administered 2020-04-29: 125 mg via INTRAVENOUS
  Filled 2020-04-29: qty 2

## 2020-04-29 MED ORDER — SODIUM CHLORIDE 0.9 % IV SOLN
500.0000 mg | INTRAVENOUS | Status: DC
  Administered 2020-04-30 (×2): 500 mg via INTRAVENOUS
  Filled 2020-04-29 (×3): qty 500

## 2020-04-29 MED ORDER — GUAIFENESIN ER 600 MG PO TB12
600.0000 mg | ORAL_TABLET | Freq: Two times a day (BID) | ORAL | Status: DC
  Administered 2020-04-30 – 2020-05-02 (×6): 600 mg via ORAL
  Filled 2020-04-29 (×7): qty 1

## 2020-04-29 MED ORDER — ACETAMINOPHEN 325 MG PO TABS
650.0000 mg | ORAL_TABLET | Freq: Four times a day (QID) | ORAL | Status: DC | PRN
  Administered 2020-04-30: 650 mg via ORAL
  Filled 2020-04-29: qty 2

## 2020-04-29 MED ORDER — ACETAMINOPHEN 650 MG RE SUPP: 650.0000 mg | Freq: Four times a day (QID) | RECTAL | Status: DC | PRN

## 2020-04-29 MED ORDER — TRAZODONE HCL 50 MG PO TABS: 25.0000 mg | ORAL_TABLET | Freq: Every evening | ORAL | Status: DC | PRN

## 2020-04-29 MED ORDER — ENOXAPARIN SODIUM 40 MG/0.4ML ~~LOC~~ SOLN
40.0000 mg | SUBCUTANEOUS | Status: DC
  Administered 2020-04-30 – 2020-05-02 (×3): 40 mg via SUBCUTANEOUS
  Filled 2020-04-29 (×3): qty 0.4

## 2020-04-29 MED ORDER — IPRATROPIUM-ALBUTEROL 0.5-2.5 (3) MG/3ML IN SOLN
3.0000 mL | Freq: Once | RESPIRATORY_TRACT | Status: AC
  Administered 2020-04-29: 3 mL via RESPIRATORY_TRACT

## 2020-04-29 NOTE — ED Notes (Signed)
Pt reports chronic pain to tailbone and bulging disk in cervical spine

## 2020-04-29 NOTE — ED Provider Notes (Signed)
[facility] Emergency Department Provider Note  ____________________________________________   I have reviewed the triage vital signs and the nursing notes.   HISTORY  Chief Complaint Shortness of Breath   History limited by: Not Limited   HPI Ernest [name] is a 64 y.o. male who presents to the emergency department today because of concerns for shortness of breath.  Patient states he has been feeling well for the past couple of days but his shortness of breath got significantly worse this afternoon.  Does have a history of COPD and is on 3 L of oxygen at baseline.  He has had a cough.  He tried taking his DuoNeb without any significant relief.  Patient denies any fevers.  He denies any known sick contacts.   Records reviewed. Per medical record review patient has a history of COPD.   Past Medical History:  Diagnosis Date  . Acute on chronic respiratory failure with hypoxia ([place]) 05/25/2017  . Acute respiratory failure with hypoxia ([place]) 05/25/2017  . COPD (chronic obstructive pulmonary disease) ([place])   . COPD with acute bronchitis ([place]) 05/25/2017  . COPD with acute exacerbation ([place]) 05/25/2017  . Diabetes mellitus without complication ([place])   . Leukocytosis 05/25/2017  . Prostate cancer [facility])     Patient Active Problem List   Diagnosis Date Noted  . Acute respiratory failure ([place]) 07/29/2018  . Prostate cancer ([place]) 12/27/2017  . Elevated PSA 11/01/2017  . Benign prostatic hyperplasia with lower urinary tract symptoms 11/01/2017  . Acute on chronic respiratory failure with hypoxia ([place]) 05/25/2017  . COPD with acute exacerbation ([place]) 05/25/2017  . COPD with acute bronchitis ([place]) 05/25/2017  . Leukocytosis 05/25/2017  . Acute respiratory failure with hypoxia ([place]) 05/25/2017    Past Surgical History:  Procedure Laterality Date  . TESTICLE SURGERY     undecended testicle repair    Prior to Admission medications   Medication Sig Start Date End  Date Taking? Authorizing Provider  albuterol (PROVENTIL HFA;VENTOLIN HFA) 108 (90 Base) MCG/ACT inhaler Inhale 2 puffs into the lungs every 6 (six) hours as needed for wheezing or shortness of breath.    [provider]  alfuzosin (UROXATRAL) 10 MG 24 hr tablet Take 1 tablet (10 mg total) by mouth daily with breakfast. 01/31/18   Stoioff, Ronda Fairly, MD  budesonide-formoterol (SYMBICORT) 160-4.5 MCG/ACT inhaler Inhale 2 puffs into the lungs 2 (two) times daily. 05/29/17   Dustin Flock, MD  furosemide (LASIX) 20 MG tablet Take 20 mg by mouth daily.     [provider]  ipratropium-albuterol (DUONEB) 0.5-2.5 (3) MG/3ML SOLN Take 3 mLs by nebulization every 4 (four) hours as needed (SOB, wheezing). 07/31/18   Salary, Avel Peace, MD  LORazepam (ATIVAN) 0.5 MG tablet Take 1 every 12 hours as needed for anxiety 07/25/18   Milton Ferguson, MD  predniSONE (DELTASONE) 50 MG tablet 1 p.o. daily 07/31/18   Salary, Holly Bodily D, MD  sertraline (ZOLOFT) 50 MG tablet Take 50 mg by mouth daily.    [provider]    Allergies Patient has no known allergies.  Family History  Problem Relation Age of Onset  . Stroke Mother   . Breast cancer Mother   . Bladder Cancer Neg Hx   . Kidney cancer Neg Hx   . Prostate cancer Neg Hx     Social History Social History   Tobacco Use  . Smoking status: Current Every Day Smoker    Packs/day: 0.50    Types: Cigarettes  .  Smokeless tobacco: Never Used  Substance Use Topics  . Alcohol use: No  . Drug use: Not on file    Review of Systems Constitutional: No fever/chills Eyes: No visual changes. ENT: No sore throat. Cardiovascular: Denies chest pain. Respiratory: Positive for cough and shortness of breath. Gastrointestinal: No abdominal pain.  No nausea, no vomiting.  No diarrhea.   Genitourinary: Negative for dysuria. Musculoskeletal: Negative for back pain. Skin: Negative for rash. Neurological: Negative for headaches, focal weakness or  numbness.  ____________________________________________   PHYSICAL EXAM:  VITAL SIGNS: ED Triage Vitals  Enc Vitals Group     BP --      Pulse Rate 04/29/20 2210 98     Resp 04/29/20 2210 (!) 27     Temp 04/29/20 2210 98.2 F (36.8 C)     Temp Source 04/29/20 2210 Oral     SpO2 04/29/20 2207 97 %     Weight 04/29/20 2211 225 lb (102.1 kg)     Height 04/29/20 2211 6\' 4"  (1.93 m)     Head Circumference --      Peak Flow --      Pain Score 04/29/20 2216 2   Constitutional: Alert and oriented.  Eyes: Conjunctivae are normal.  ENT      Head: Normocephalic and atraumatic.      Nose: No congestion/rhinnorhea.      Mouth/Throat: Mucous membranes are moist.      Neck: No stridor. Hematological/Lymphatic/Immunilogical: No cervical lymphadenopathy. Cardiovascular: Normal rate, regular rhythm.  No murmurs, rubs, or gallops.  Respiratory: Increased respiratory effort and tachypnea. Diffuse expiratory wheezing.  Gastrointestinal: Soft and non tender. No rebound. No guarding.  Genitourinary: Deferred Musculoskeletal: Normal range of motion in all extremities. No lower extremity edema. Neurologic:  Normal speech and language. No gross focal neurologic deficits are appreciated.  Skin:  Skin is warm, dry and intact. No rash noted. Psychiatric: Mood and affect are normal. Speech and behavior are normal. Patient exhibits appropriate insight and judgment.  ____________________________________________    LABS (pertinent positives/negatives)  CBC wbc 10.7, hgb 15.7, plt 398 BMP na 138, k 3.8, glu 101, cr 1.05 VBG pH 7.41  ____________________________________________   EKG  I, Nance Pear, attending physician, personally viewed and interpreted this EKG  EKG Time: 2205 Rate: 96 Rhythm: sinus rhythm Axis: normal Intervals: qtc 452 QRS: narrow ST changes: no st elevation Impression: normal ekg ____________________________________________    RADIOLOGY  CXR No acute  abnormality  ____________________________________________   PROCEDURES  Procedures  CRITICAL CARE Performed by: Nance Pear   Total critical care time: 30 minutes  Critical care time was exclusive of separately billable procedures and treating other patients.  Critical care was necessary to treat or prevent imminent or life-threatening deterioration.  Critical care was time spent personally by me on the following activities: development of treatment plan with patient and/or surrogate as well as nursing, discussions with consultants, evaluation of patient's response to treatment, examination of patient, obtaining history from patient or surrogate, ordering and performing treatments and interventions, ordering and review of laboratory studies, ordering and review of radiographic studies, pulse oximetry and re-evaluation of patient's condition.  ____________________________________________   INITIAL IMPRESSION / ASSESSMENT AND PLAN / ED COURSE  Pertinent labs & imaging results that were available during my care of the patient were reviewed by me and considered in my medical decision making (see chart for details).   Patient presented to the emergency department today because of concerns for shortness of breath.  On  initial exam patient is quite short of breath.  He does appear to be in some respiratory distress.  Auscultation revealed diffuse bilateral expiratory wheezing.  Patient with history of COPD.  Given the level of distress I did ask for him to be placed on BiPAP.  He did state he felt more comfortable and clinically he.  In less distress on BiPAP.  Patient was given steroids as well as DuoNeb treatments.  Chest x-ray without any pneumonia.  Will plan on admission.  ____________________________________________   FINAL CLINICAL IMPRESSION(S) / ED DIAGNOSES  Final diagnoses:  COPD exacerbation ([place])  Respiratory distress     Note: This dictation was prepared with  Dragon dictation. Any transcriptional errors that result from this process are unintentional     Nance Pear, MD 04/29/20 2257

## 2020-04-29 NOTE — H&P (Addendum)
[facility] [place] NAME: Joel Debusk    MR#:  [id number]  DATE OF BIRTH:  June 02, 1956  DATE OF ADMISSION:  04/29/2020  PRIMARY CARE PHYSICIAN: Tracie Harrier, MD   REQUESTING/REFERRING PHYSICIAN: Nance Pear, MD CHIEF COMPLAINT:   Chief Complaint  Patient presents with  . Shortness of Breath    HISTORY OF PRESENT ILLNESS:  Jarette Pullum  is a 64 y.o. African-American male with a known history of COPD and chronic respiratory failure on home O2 at 3 L/min, type II diabetes mellitus and prostate cancer, who presented to emergency room with acute onset of worsening dyspnea associated cough productive of clear sputum as well as wheezing for the last 5 days that got remarkably worse today.  He denied any fever or chills.  No chest pain or palpitations.  He complained of chronic back pain. No fever or chills.  No dysuria, urinary frequency or urgency or flank pain.  Upon presentation to the emergency room, respiratory it was 27 and O2 sat was 97 to 99% on BiPAP that was placed due to respiratory distress.  Listed venous blood gas showed pH 7.41 with a PCO2 34 PO2 76, bicarbonate of 21.6 and O2 sat 95.2% (this is likely an ABG).  CBC showed WBC 10.7 and mild microcytosis.  COVID-19 PCR came back negative.  Portable chest x-ray showed no acute cardiopulmonary disease.  EKG showed normal sinus rhythm with rate of 96 with slightly poor R wave progression.  The patient was given 3 duo nebs and IV Solu-Medrol.  He will be admitted to a progressive unit bed for further evaluation and management.  PAST MEDICAL HISTORY:   Past Medical History:  Diagnosis Date  . Acute on chronic respiratory failure with hypoxia ([place]) 05/25/2017  . Acute respiratory failure with hypoxia ([place]) 05/25/2017  . COPD (chronic obstructive pulmonary disease) ([place])   . COPD with acute bronchitis ([place]) 05/25/2017  . COPD with acute exacerbation ([place]) 05/25/2017  . Diabetes mellitus without  complication ([place])   . Leukocytosis 05/25/2017  . Prostate cancer ([place])     PAST SURGICAL HISTORY:   Past Surgical History:  Procedure Laterality Date  . TESTICLE SURGERY     undecended testicle repair    SOCIAL HISTORY:   Social History   Tobacco Use  . Smoking status: Current Every Day Smoker    Packs/day: 0.50    Types: Cigarettes  . Smokeless tobacco: Never Used  Substance Use Topics  . Alcohol use: No    FAMILY HISTORY:   Family History  Problem Relation Age of Onset  . Stroke Mother   . Breast cancer Mother   . Bladder Cancer Neg Hx   . Kidney cancer Neg Hx   . Prostate cancer Neg Hx     DRUG ALLERGIES:  No Known Allergies  REVIEW OF SYSTEMS:   ROS As per history of present illness. All pertinent systems were reviewed above. Constitutional,  HEENT, cardiovascular, respiratory, GI, GU, musculoskeletal, neuro, psychiatric, endocrine,  integumentary and hematologic systems were reviewed and are otherwise  negative/unremarkable except for positive findings mentioned above in the HPI.   MEDICATIONS AT HOME:   Prior to Admission medications   Medication Sig Start Date End Date Taking? Authorizing Provider  albuterol (PROVENTIL HFA;VENTOLIN HFA) 108 (90 Base) MCG/ACT inhaler Inhale 2 puffs into the lungs every 6 (six) hours as needed for wheezing or shortness of breath.    [provider]  alfuzosin (UROXATRAL) 10 MG 24  hr tablet Take 1 tablet (10 mg total) by mouth daily with breakfast. 01/31/18   Stoioff, Ronda Fairly, MD  budesonide-formoterol (SYMBICORT) 160-4.5 MCG/ACT inhaler Inhale 2 puffs into the lungs 2 (two) times daily. 05/29/17   Dustin Flock, MD  furosemide (LASIX) 20 MG tablet Take 20 mg by mouth daily.     [provider]  ipratropium-albuterol (DUONEB) 0.5-2.5 (3) MG/3ML SOLN Take 3 mLs by nebulization every 4 (four) hours as needed (SOB, wheezing). 07/31/18   Salary, Avel Peace, MD  LORazepam (ATIVAN) 0.5 MG tablet Take 1 every 12  hours as needed for anxiety 07/25/18   Milton Ferguson, MD  predniSONE (DELTASONE) 50 MG tablet 1 p.o. daily 07/31/18   Salary, Holly Bodily D, MD  sertraline (ZOLOFT) 50 MG tablet Take 50 mg by mouth daily.    [provider]      VITAL SIGNS:  Blood pressure 128/82, pulse 84, temperature 98.2 F (36.8 C), temperature source Oral, resp. rate 17, height 6\' 4"  (1.93 m), weight 102.1 kg, SpO2 95 %.  PHYSICAL EXAMINATION:  Physical Exam  GENERAL:  64 y.o.-year-old patient lying in the bed with mild respiratory distress on BiPAP.  Patient had significant bouts of cough with respiratory distress and BiPAP was temporarily removed during my presence for expectoration.  He became tachypneic with increased work of breathing they gradually improved with respiratory rate down from 29-17.  He was later on paper down to nasal cannula at 4 L with approximately 97% EYES: Pupils equal, round, reactive to light and accommodation. No scleral icterus. Extraocular muscles intact.  HEENT: Head atraumatic, normocephalic. Oropharynx and nasopharynx clear.  NECK:  Supple, no jugular venous distention. No thyroid enlargement, no tenderness.  LUNGS: Diffuse expiratory wheezes with tight expiratory airflow and harsh vesicular breathing.  CARDIOVASCULAR: Regular rate and rhythm, S1, S2 normal. No murmurs, rubs, or gallops.  ABDOMEN: Soft, nondistended, nontender. Bowel sounds present. No organomegaly or mass.  EXTREMITIES: No pedal edema, cyanosis, or clubbing.  NEUROLOGIC: Cranial nerves II through XII are intact. Muscle strength 5/5 in all extremities. Sensation intact. Gait not checked.  PSYCHIATRIC: The patient is alert and oriented x 3.  Normal affect and good eye contact. SKIN: No obvious rash, lesion, or ulcer.   LABORATORY PANEL:   CBC Recent Labs  Lab 04/29/20 2213  WBC 10.7*  HGB 15.7  HCT 48.5  PLT 398    ------------------------------------------------------------------------------------------------------------------  Chemistries  Recent Labs  Lab 04/29/20 2213  NA 138  K 3.8  CL 106  CO2 20*  GLUCOSE 101*  BUN 24*  CREATININE 1.05  CALCIUM 9.0   ------------------------------------------------------------------------------------------------------------------  Cardiac Enzymes No results for input(s): TROPONINI in the last 168 hours. ------------------------------------------------------------------------------------------------------------------  RADIOLOGY:  DG Chest Portable 1 View  Result Date: 04/29/2020 CLINICAL DATA:  Shortness of breath, COPD EXAM: PORTABLE CHEST 1 VIEW COMPARISON:  07/29/2018 FINDINGS: Single frontal view of the chest demonstrates an unremarkable cardiac silhouette. Subsegmental atelectasis or scarring at the lung bases, stable. No airspace disease, effusion, or pneumothorax. No acute bony abnormality. IMPRESSION: 1. Stable exam, no acute process. Electronically Signed   By: Randa Ngo M.D.   On: 04/29/2020 22:40      IMPRESSION AND PLAN:   1.  COPD acute exacerbation like secondary to acute bronchitis.  The patient initially required BiPAP with acute on chronic respiratory failure and is currently on O2 by nasal cannula. -The patient will be admitted to a progressive unit bed. -We will continue steroid therapy with IV Solu-Medrol. -We  will continue DuoNebs 4 times daily and every 4 hours as needed. -We will hold off Symbicort and Brovana while continuing Pulmicort. -We will place the patient on IV Rocephin and Zithromax given severity of exacerbation of his COPD. -We will follow sputum culture and sensitivity.  2.  Type 2 diabetes mellitus without complications. -The patient will be placed on supplement coverage with NovoLog.  We will hold off his Metformin.  3.  Depression. -We will continue Zoloft and as needed Ativan.  4.  GERD. -The  patient will be continued on PPI therapy.  5.  Ongoing tobacco abuse. -I counseled the patient for smoking cessation and he will receive further counseling here.  6.  BPH. -We will continue Rapaflo.  7.  DVT prophylaxis. -Subcutaneous Lovenox.  All the records are reviewed and case discussed with ED provider. The plan of care was discussed in details with the patient (and family). I answered all questions. The patient agreed to proceed with the above mentioned plan. Further management will depend upon hospital course.   CODE STATUS: Full code  Status is: Inpatient  Remains inpatient appropriate because:Ongoing diagnostic testing needed not appropriate for outpatient work up, Unsafe d/c plan, IV treatments appropriate due to intensity of illness or inability to take PO and Inpatient level of care appropriate due to severity of illness   Dispo: The patient is from: Home              Anticipated d/c is to: Home              Anticipated d/c date is: 2 days              Patient currently is not medically stable to d/c.   TOTAL TIME TAKING CARE OF THIS PATIENT: 55 minutes.    Christel Mormon M.D on 04/29/2020 at 11:25 PM  [facility]   From 7 PM-7 AM, contact night-coverage [email or web address]  CC: Primary care physician; Tracie Harrier, MD   Note: This dictation was prepared with Dragon dictation along with smaller phrase technology. Any transcriptional errors that result from this process are unintentional.

## 2020-04-29 NOTE — ED Triage Notes (Signed)
Pt arrives via POV with complaint of respiratory distress. Pt has history of COPD, and presents with labored breathing. Pt SpO2 97% on 4L

## 2020-04-29 NOTE — ED Notes (Signed)
Pt reports he moved here from [place] approx 2 weeks ago, with increased difficulty breathing beginning 5 days ago. Pt reports his breathing has gradually gotten worse "I should have come in yesterday"  Pt is on 3L chronically, increased to 4L at home with little relief

## 2020-04-30 ENCOUNTER — Encounter: Payer: Self-pay | Admitting: Family Medicine

## 2020-04-30 ENCOUNTER — Other Ambulatory Visit: Payer: Self-pay

## 2020-04-30 DIAGNOSIS — J9621 Acute and chronic respiratory failure with hypoxia: Secondary | ICD-10-CM

## 2020-04-30 LAB — CBC
HCT: 46.9 % (ref 39.0–52.0)
Hemoglobin: 14.9 g/dL (ref 13.0–17.0)
MCH: 25.6 pg — ABNORMAL LOW (ref 26.0–34.0)
MCHC: 31.8 g/dL (ref 30.0–36.0)
MCV: 80.6 fL (ref 80.0–100.0)
Platelets: 372 10*3/uL (ref 150–400)
RBC: 5.82 MIL/uL — ABNORMAL HIGH (ref 4.22–5.81)
RDW: 16.6 % — ABNORMAL HIGH (ref 11.5–15.5)
WBC: 6.8 10*3/uL (ref 4.0–10.5)
nRBC: 0 % (ref 0.0–0.2)

## 2020-04-30 LAB — BASIC METABOLIC PANEL
Anion gap: 11 (ref 5–15)
BUN: 23 mg/dL (ref 8–23)
CO2: 18 mmol/L — ABNORMAL LOW (ref 22–32)
Calcium: 8.6 mg/dL — ABNORMAL LOW (ref 8.9–10.3)
Chloride: 109 mmol/L (ref 98–111)
Creatinine, Ser: 1.08 mg/dL (ref 0.61–1.24)
GFR calc Af Amer: >60 mL/min (ref >60)
GFR calc non Af Amer: >60 mL/min (ref >60)
Glucose, Bld: 246 mg/dL — ABNORMAL HIGH (ref 70–99)
Potassium: 3.8 mmol/L (ref 3.5–5.1)
Sodium: 138 mmol/L (ref 135–145)

## 2020-04-30 LAB — HEMOGLOBIN A1C
Hgb A1c MFr Bld: 6.7 % — ABNORMAL HIGH (ref 4.8–5.6)
Mean Plasma Glucose: 146 mg/dL

## 2020-04-30 LAB — GLUCOSE, CAPILLARY
Glucose-Capillary: 175 mg/dL — ABNORMAL HIGH (ref 70–99)
Glucose-Capillary: 180 mg/dL — ABNORMAL HIGH (ref 70–99)
Glucose-Capillary: 139 mg/dL — ABNORMAL HIGH (ref 70–99)
Glucose-Capillary: 144 mg/dL — ABNORMAL HIGH (ref 70–99)

## 2020-04-30 LAB — SARS CORONAVIRUS 2 BY RT PCR (HOSPITAL ORDER, PERFORMED IN ~~LOC~~ HOSPITAL LAB): SARS Coronavirus 2: NEGATIVE

## 2020-04-30 MED ORDER — PANTOPRAZOLE SODIUM 40 MG PO TBEC
40.0000 mg | DELAYED_RELEASE_TABLET | Freq: Every day | ORAL | Status: DC
  Administered 2020-04-30 – 2020-05-02 (×3): 40 mg via ORAL
  Filled 2020-04-30 (×3): qty 1

## 2020-04-30 MED ORDER — INSULIN ASPART 100 UNIT/ML ~~LOC~~ SOLN
0.0000 [IU] | Freq: Three times a day (TID) | SUBCUTANEOUS | Status: DC
  Administered 2020-04-30 (×2): 2 [IU] via SUBCUTANEOUS
  Administered 2020-04-30 (×2): 3 [IU] via SUBCUTANEOUS
  Administered 2020-05-01: 22:00:00 11 [IU] via SUBCUTANEOUS
  Administered 2020-05-01 (×2): 3 [IU] via SUBCUTANEOUS
  Administered 2020-05-02: 09:00:00 2 [IU] via SUBCUTANEOUS
  Filled 2020-04-30 (×8): qty 1

## 2020-04-30 MED ORDER — LIDOCAINE VISCOUS HCL 2 % MT SOLN
15.0000 mL | OROMUCOSAL | Status: DC | PRN
  Filled 2020-04-30 (×2): qty 15

## 2020-04-30 MED ORDER — HYDROCODONE-ACETAMINOPHEN 5-325 MG PO TABS
1.0000 | ORAL_TABLET | Freq: Once | ORAL | Status: AC
  Administered 2020-04-30: 1 via ORAL
  Filled 2020-04-30: qty 1

## 2020-04-30 MED ORDER — ARFORMOTEROL TARTRATE 15 MCG/2ML IN NEBU
15.0000 ug | INHALATION_SOLUTION | Freq: Two times a day (BID) | RESPIRATORY_TRACT | Status: DC
  Administered 2020-05-01 – 2020-05-02 (×2): 15 ug via RESPIRATORY_TRACT
  Filled 2020-04-30 (×5): qty 2

## 2020-04-30 MED ORDER — TAMSULOSIN HCL 0.4 MG PO CAPS: 0.4000 mg | ORAL_CAPSULE | Freq: Every day | ORAL | Status: DC

## 2020-04-30 MED ORDER — FINASTERIDE 5 MG PO TABS
5.0000 mg | ORAL_TABLET | Freq: Every day | ORAL | Status: DC
  Administered 2020-04-30 – 2020-05-01 (×2): 5 mg via ORAL
  Filled 2020-04-30 (×3): qty 1

## 2020-04-30 MED ORDER — HYDROCODONE-ACETAMINOPHEN 5-325 MG PO TABS
1.0000 | ORAL_TABLET | ORAL | Status: DC | PRN
  Administered 2020-04-30 – 2020-05-01 (×3): 1 via ORAL
  Filled 2020-04-30 (×3): qty 1

## 2020-04-30 MED ORDER — CALCIUM CARBONATE ANTACID 500 MG PO CHEW
2.0000 | CHEWABLE_TABLET | Freq: Three times a day (TID) | ORAL | Status: DC | PRN
  Administered 2020-04-30 – 2020-05-01 (×2): 400 mg via ORAL
  Filled 2020-04-30 (×2): qty 2

## 2020-04-30 MED ORDER — ALBUTEROL SULFATE (2.5 MG/3ML) 0.083% IN NEBU
2.5000 mg | INHALATION_SOLUTION | RESPIRATORY_TRACT | Status: DC | PRN
  Administered 2020-05-01 (×2): 2.5 mg via RESPIRATORY_TRACT
  Filled 2020-04-30 (×2): qty 3

## 2020-04-30 MED ORDER — BUDESONIDE 0.25 MG/2ML IN SUSP
0.2500 mg | Freq: Two times a day (BID) | RESPIRATORY_TRACT | Status: DC
  Administered 2020-04-30 – 2020-05-02 (×4): 0.25 mg via RESPIRATORY_TRACT
  Filled 2020-04-30 (×4): qty 2

## 2020-04-30 MED ORDER — MELOXICAM 7.5 MG PO TABS
15.0000 mg | ORAL_TABLET | Freq: Every day | ORAL | Status: DC
  Administered 2020-05-01 (×2): 15 mg via ORAL
  Filled 2020-04-30 (×4): qty 2

## 2020-04-30 MED ORDER — METHYLPREDNISOLONE SODIUM SUCC 125 MG IJ SOLR
60.0000 mg | Freq: Three times a day (TID) | INTRAMUSCULAR | Status: DC
  Administered 2020-04-30 – 2020-05-01 (×2): 60 mg via INTRAVENOUS
  Filled 2020-04-30 (×2): qty 2

## 2020-04-30 MED ORDER — LORAZEPAM 0.5 MG PO TABS: 0.5000 mg | ORAL_TABLET | Freq: Three times a day (TID) | ORAL | Status: DC | PRN

## 2020-04-30 MED ORDER — IPRATROPIUM-ALBUTEROL 0.5-2.5 (3) MG/3ML IN SOLN
3.0000 mL | RESPIRATORY_TRACT | Status: DC | PRN
  Administered 2020-04-30: 3 mL via RESPIRATORY_TRACT
  Filled 2020-04-30: qty 3

## 2020-04-30 NOTE — Hospital Course (Signed)
Ernest [name]  is a 64 y.o. African-American male with a history of COPD and chronic respiratory failure on continuous 3 L/min home oxygen, type II diabetes mellitus and prostate cancer, who presented to ED on 04/29/20 with progressively worsening dyspnea, productive cough x 5 days that got remarkably worse today.  In the ED, patient was in significant respiratory distress and tachypneic, placed on Bipap.  Covid-19 negative.  Chest xray negative for acute abnormalities.  Patient was treated with Duonebs and IV steroids.  Admitted to hospitalist service for further management of acute on chronic hypoxic respiratory failure secondary to acute exacerbation of COPD.

## 2020-04-30 NOTE — ED Notes (Signed)
Pt stood up per request with this rn at bedside, states it helps his back pain. Hospital bed given for pt comfort. Pt tolerated standing well, sats 98% on 3.5L

## 2020-04-30 NOTE — ED Notes (Signed)
Pt taken off bipap placed on 4L [place]. NAD at this time, pt breathing calmly and unlabored. Reinforced that if any part of pt breathing changes, he needs to immediately notify RN. Will frequently reevaluate

## 2020-04-30 NOTE — Progress Notes (Addendum)
PROGRESS NOTE    Ernest [name]   [id number]  DOB: 07/26/1956  PCP: Tracie Harrier, MD    DOA: 04/29/2020 LOS: 1   Brief Narrative   Ernest [name]  is a 64 y.o. African-American male with a history of COPD and chronic respiratory failure on continuous 3 L/min home oxygen, type II diabetes mellitus and prostate cancer, who presented to ED on 04/29/20 with progressively worsening dyspnea, productive cough x 5 days that got remarkably worse today.  In the ED, patient was in significant respiratory distress and tachypneic, placed on Bipap.  Covid-19 negative.  Chest xray negative for acute abnormalities.  Patient was treated with Duonebs and IV steroids.  Admitted to hospitalist service for further management of acute on chronic hypoxic respiratory failure secondary to acute exacerbation of COPD.     Assessment & Plan   Principal Problem:   COPD exacerbation ([place]) Active Problems:   Acute on chronic respiratory failure with hypoxia (HCC)   Benign prostatic hyperplasia with lower urinary tract symptoms   Prostate cancer ([place])   Acute on chronic respiratory failure with hypoxia secondary to  COPD exacerbation - present on admission with respiratory distress, poor aeration and diffuse wheezing.   --Solu-medrol 60 mg IV q8h --Duonebs q6h while awake, Albuterol nebs q3h PRN --Azithromycin --Continue Pulmicort, Brovana nebs --PRN antitussives --supplemental O2, maintain O2 sat > 88% --Bipap PRN  Type 2 Diabetes - sliding scale Novolog.  Hold metformin.  Chronic back pain - on Norco and Mobic at home, continued.  Benign prostatic hyperplasia - continue home meds  Depression/Anxiety - continue Zoloft and PRN Ativan  GERD - continue PPI  Prostate cancer - no acute issues  DVT prophylaxis: Lovenox  Diet:  Diet Orders (From admission, onward)    Start     Ordered   04/30/20 0341  Diet Carb Modified Fluid consistency: Thin; Room service appropriate? Yes  Diet effective now      Question Answer Comment  Diet-HS Snack? Nothing   Calorie Level Medium 1600-2000   Fluid consistency: Thin   Room service appropriate? Yes      04/30/20 0340            Code Status: Full Code    Subjective 04/30/20    Patient seen in the ED on hold for a bed.  He reports feeling better, asks if he can go home.  During conversation, he was visibly short of breath with pursed lip breathing.  Denies fever/chills.  Says cough improving.  Says breathing much better today.  Confirms he uses 3 L/min O2 at baseline.    Disposition Plan & Communication   Status is: Inpatient  Remains inpatient appropriate because:IV treatments appropriate due to intensity of illness or inability to take PO   Dispo: The patient is from: Home              Anticipated d/c is to: Home              Anticipated d/c date is: 2 days              Patient currently is not medically stable to d/c.   Family Communication: none at bedside, will attempt to call    Consults, Procedures, Significant Events   Consultants:   none  Procedures:   none  Antimicrobials:   Zithromax 5/19 >>   Objective   Vitals:   04/30/20 1345 04/30/20 1400 04/30/20 1415 04/30/20 1430  BP:  126/69  137/83  Pulse: 84 87  69 89  Resp: 19 19 19 19   Temp:      TempSrc:      SpO2: 96% 93% 96% 97%  Weight:      Height:        Intake/Output Summary (Last 24 hours) at 04/30/2020 1616 Last data filed at 04/30/2020 1448 Gross per 24 hour  Intake 800 ml  Output 1200 ml  Net -400 ml   Filed Weights   04/29/20 2211  Weight: 102.1 kg    Physical Exam:  General exam: awake, alert, no acute distress HEENT: moist mucus membranes, hearing grossly normal  Respiratory system: conversational dyspnea, very poor air movement, faint expiratory wheezes, increased respiratory effort. Cardiovascular system: normal S1/S2, RRR, no JVD, murmurs, rubs, gallops, no pedal edema.   Central nervous system: A&O x4. no gross focal  neurologic deficits, normal speech Extremities: moves all, normal tone Skin: dry, intact, normal temperature, normal color Psychiatry: normal mood, congruent affect, judgement and insight appear normal  Labs   Data Reviewed: I have personally reviewed following labs and imaging studies  CBC: Recent Labs  Lab 04/29/20 2213 04/30/20 0634  WBC 10.7* 6.8  NEUTROABS 5.4  --   HGB 15.7 14.9  HCT 48.5 46.9  MCV 78.2* 80.6  PLT 398 [id number]   Basic Metabolic Panel: Recent Labs  Lab 04/29/20 2213 04/30/20 0634  NA 138 138  K 3.8 3.8  CL 106 109  CO2 20* 18*  GLUCOSE 101* 246*  BUN 24* 23  CREATININE 1.05 1.08  CALCIUM 9.0 8.6*   GFR: Estimated Creatinine Clearance: 86 mL/min (by C-G formula based on SCr of 1.08 mg/dL). Liver Function Tests: No results for input(s): AST, ALT, ALKPHOS, BILITOT, PROT, ALBUMIN in the last 168 hours. No results for input(s): LIPASE, AMYLASE in the last 168 hours. No results for input(s): AMMONIA in the last 168 hours. Coagulation Profile: No results for input(s): INR, PROTIME in the last 168 hours. Cardiac Enzymes: No results for input(s): CKTOTAL, CKMB, CKMBINDEX, TROPONINI in the last 168 hours. BNP (last 3 results) No results for input(s): PROBNP in the last 8760 hours. HbA1C: No results for input(s): HGBA1C in the last 72 hours. CBG: Recent Labs  Lab 04/30/20 0859 04/30/20 1151  GLUCAP 175* 180*   Lipid Profile: No results for input(s): CHOL, HDL, LDLCALC, TRIG, CHOLHDL, LDLDIRECT in the last 72 hours. Thyroid Function Tests: No results for input(s): TSH, T4TOTAL, FREET4, T3FREE, THYROIDAB in the last 72 hours. Anemia Panel: No results for input(s): VITAMINB12, FOLATE, FERRITIN, TIBC, IRON, RETICCTPCT in the last 72 hours. Sepsis Labs: No results for input(s): PROCALCITON, LATICACIDVEN in the last 168 hours.  Recent Results (from the past 240 hour(s))  SARS Coronavirus 2 by RT PCR (hospital order, performed in [facility] hospital  lab) Nasopharyngeal Nasopharyngeal Swab     Status: None   Collection Time: 04/30/20  1:27 AM   Specimen: Nasopharyngeal Swab  Result Value Ref Range Status   SARS Coronavirus 2 NEGATIVE NEGATIVE Final    Comment: (NOTE) SARS-CoV-2 target nucleic acids are NOT DETECTED. The SARS-CoV-2 RNA is generally detectable in upper and lower respiratory specimens during the acute phase of infection. The lowest concentration of SARS-CoV-2 viral copies this assay can detect is 250 copies / mL. A negative result does not preclude SARS-CoV-2 infection and should not be used as the sole basis for treatment or other patient management decisions.  A negative result may occur with improper specimen collection / handling, submission of specimen other than nasopharyngeal  swab, presence of viral mutation(s) within the areas targeted by this assay, and inadequate number of viral copies (<250 copies / mL). A negative result must be combined with clinical observations, patient history, and epidemiological information. Fact Sheet for Patients:   StrictlyIdeas.no Fact Sheet for Healthcare Providers: BankingDealers.co.za This test is not yet approved or cleared  by the [place] FDA and has been authorized for detection and/or diagnosis of SARS-CoV-2 by FDA under an Emergency Use Authorization (EUA).  This EUA will remain in effect (meaning this test can be used) for the duration of the COVID-19 declaration under Section 564(b)(1) of the Act, 21 U.S.C. section 360bbb-3(b)(1), unless the authorization is terminated or revoked sooner. Performed at [facility], [address] [place], [place] [address]       Imaging Studies   DG Chest Portable 1 View  Result Date: 04/29/2020 CLINICAL DATA:  Shortness of breath, COPD EXAM: PORTABLE CHEST 1 VIEW COMPARISON:  07/29/2018 FINDINGS: Single frontal view of the chest demonstrates an unremarkable cardiac  silhouette. Subsegmental atelectasis or scarring at the lung bases, stable. No airspace disease, effusion, or pneumothorax. No acute bony abnormality. IMPRESSION: 1. Stable exam, no acute process. Electronically Signed   By: Randa Ngo M.D.   On: 04/29/2020 22:40     Medications   Scheduled Meds: . alfuzosin  10 mg Oral Q breakfast  . arformoterol  15 mcg Nebulization BID  . budesonide (PULMICORT) nebulizer solution  0.25 mg Nebulization BID  . enoxaparin (LOVENOX) injection  40 mg Subcutaneous Q24H  . finasteride  5 mg Oral QHS  . guaiFENesin  600 mg Oral BID  . insulin aspart  0-15 Units Subcutaneous TID PC & HS  . ipratropium-albuterol  3 mL Nebulization QID  . meloxicam  15 mg Oral QHS  . methylPREDNISolone (SOLU-MEDROL) injection  60 mg Intravenous Q8H  . pantoprazole  40 mg Oral Daily  . sertraline  50 mg Oral Daily   Continuous Infusions: . azithromycin Stopped (04/30/20 0343)  . cefTRIAXone (ROCEPHIN)  IV Stopped (04/30/20 0146)       LOS: 1 day    Time spent: 30 minutes    Ezekiel Slocumb, DO [facility]  04/30/2020, 4:16 PM    If 7PM-7AM, please contact night-coverage. How to contact the [facility] Attending or Consulting provider [place] or covering provider during after hours [place], for this patient?    1. Check the care team in [facility] and look for a) attending/consulting [facility] provider listed and b) the [facility] team listed 2. Log into [email or web address] and use [place]'s universal password to access. If you do not have the password, please contact the hospital operator. 3. Locate the [facility] provider you are looking for under [facility] and page to a number that you can be directly reached. 4. If you still have difficulty reaching the provider, please page the [facility] (Director on Call) for the Hospitalists listed on amion for assistance.

## 2020-05-01 LAB — GLUCOSE, CAPILLARY
Glucose-Capillary: 176 mg/dL — ABNORMAL HIGH (ref 70–99)
Glucose-Capillary: 157 mg/dL — ABNORMAL HIGH (ref 70–99)
Glucose-Capillary: 187 mg/dL — ABNORMAL HIGH (ref 70–99)
Glucose-Capillary: 318 mg/dL — ABNORMAL HIGH (ref 70–99)

## 2020-05-01 LAB — CBC
HCT: 47.4 % (ref 39.0–52.0)
Hemoglobin: 15.1 g/dL (ref 13.0–17.0)
MCH: 25.8 pg — ABNORMAL LOW (ref 26.0–34.0)
MCHC: 31.9 g/dL (ref 30.0–36.0)
MCV: 80.9 fL (ref 80.0–100.0)
Platelets: 404 10*3/uL — ABNORMAL HIGH (ref 150–400)
RBC: 5.86 MIL/uL — ABNORMAL HIGH (ref 4.22–5.81)
RDW: 17.1 % — ABNORMAL HIGH (ref 11.5–15.5)
WBC: 14.6 10*3/uL — ABNORMAL HIGH (ref 4.0–10.5)
nRBC: 0 % (ref 0.0–0.2)

## 2020-05-01 LAB — HIV ANTIBODY (ROUTINE TESTING W REFLEX): HIV Screen 4th Generation wRfx: NONREACTIVE

## 2020-05-01 MED ORDER — MOMETASONE FURO-FORMOTEROL FUM 200-5 MCG/ACT IN AERO
2.0000 | INHALATION_SPRAY | Freq: Two times a day (BID) | RESPIRATORY_TRACT | Status: DC
  Administered 2020-05-01 – 2020-05-02 (×2): 2 via RESPIRATORY_TRACT
  Filled 2020-05-01: qty 8.8

## 2020-05-01 MED ORDER — AZITHROMYCIN 500 MG PO TABS
500.0000 mg | ORAL_TABLET | Freq: Every day | ORAL | Status: AC
  Administered 2020-05-01: 500 mg via ORAL
  Filled 2020-05-01: qty 1

## 2020-05-01 MED ORDER — MOMETASONE FURO-FORMOTEROL FUM 200-5 MCG/ACT IN AERO
2.0000 | INHALATION_SPRAY | Freq: Two times a day (BID) | RESPIRATORY_TRACT | Status: DC
  Filled 2020-05-01: qty 8.8

## 2020-05-01 MED ORDER — BENZONATATE 100 MG PO CAPS
100.0000 mg | ORAL_CAPSULE | Freq: Three times a day (TID) | ORAL | Status: DC | PRN
  Administered 2020-05-01: 100 mg via ORAL
  Filled 2020-05-01: qty 1

## 2020-05-01 MED ORDER — METHYLPREDNISOLONE SODIUM SUCC 125 MG IJ SOLR
60.0000 mg | Freq: Two times a day (BID) | INTRAMUSCULAR | Status: DC
  Administered 2020-05-01 – 2020-05-02 (×2): 60 mg via INTRAVENOUS
  Filled 2020-05-01 (×2): qty 2

## 2020-05-01 NOTE — Evaluation (Signed)
Physical Therapy Evaluation Patient Details Name: Ernest [name] MRN: [id number] DOB: [birth year] Today's Date: 05/01/2020   History of Present Illness  Ernest [name]  is a 64 y.o. African-American male with a history of COPD and chronic respiratory failure on continuous 3 L/min home oxygen, type II diabetes mellitus and prostate cancer, who presented to ED on 04/29/20 with progressively worsening dyspnea, productive cough x 5 days that got remarkably worse today.  In the ED, patient was in significant respiratory distress and tachypneic, placed on Bipap.  Covid-19 negative.  Chest xray negative for acute abnormalities.  Patient was treated with Duonebs and IV steroids.  Clinical Impression  Pt admitted with above diagnosis. Pt currently with functional limitations due to the deficits listed below (see "PT Problem List"). Upon entry, pt in recliner, awake and agreeable to participate. The pt is alert and oriented x4, pleasant, conversational, and generally a great historian. Pt performs all mobility independently, AMB 154ft on room air, then 433ft on 3L/min (as per baseline). Pt reports feeling close to baseline physically, but continues to have congestion, as well as a painful cough. Patient is at baseline level of independence, all education completed, and time is given to address all questions/concerns. No additional skilled PT services needed at this time, PT signing off. PT recommends daily ambulation ad lib or with nursing staff as needed to prevent deconditioning.      Follow Up Recommendations No PT follow up    Equipment Recommendations  None recommended by PT    Recommendations for Other Services       Precautions / Restrictions Precautions Precautions: None Restrictions Weight Bearing Restrictions: No      Mobility  Bed Mobility               General bed mobility comments: in chair  Transfers Overall transfer level: Independent                   Ambulation/Gait Ambulation/Gait assistance: Independent Gait Distance (Feet): 400 Feet Assistive device: IV Pole Gait Pattern/deviations: WFL(Within Functional Limits)   Gait velocity interpretation: >2.62 ft/sec, indicative of community ambulatory General Gait Details: AMB 150ft on room air, then 432ft on 3L/min; SpO2: 89% and 98% respectively.  Stairs            Wheelchair Mobility    Modified Rankin (Stroke Patients Only)       Balance Overall balance assessment: Independent                                           Pertinent Vitals/Pain Pain Assessment: No/denies pain    Home Living Family/patient expects to be discharged to:: Private residence Living Arrangements: Spouse/significant other;Children(Staying with Son, in town from [place]) Available Help at Discharge: Family;Available 24 hours/day(Son works at [employer], is home for [employer]; othe rson in [place]) Type of Home: Apartment Home Access: Level entry     [place]: Two level;Able to live on main level with bedroom/bathroom Home Equipment: None Additional Comments: Has a posrtable concerntrator for AMB, and a larger one for over night. No falls history of imbalance.    Prior Function           Comments: Room air at rest, 2L continuous at night; will increase to 3L in community     Hand Dominance        Extremity/Trunk Assessment   Upper Extremity Assessment Upper  Extremity Assessment: Overall WFL for tasks assessed    Lower Extremity Assessment Lower Extremity Assessment: Overall WFL for tasks assessed    Cervical / Trunk Assessment Cervical / Trunk Assessment: Normal  Communication   Communication: No difficulties  Cognition Arousal/Alertness: Awake/alert Behavior During Therapy: WFL for tasks assessed/performed Overall Cognitive Status: Within Functional Limits for tasks assessed                                        General Comments      Exercises      Assessment/Plan    PT Assessment Patent does not need any further PT services  PT Problem List Decreased activity tolerance;Decreased mobility;Cardiopulmonary status limiting activity;Decreased knowledge of precautions       PT Treatment Interventions DME instruction;Balance training;Gait training;Functional mobility training;Therapeutic activities;Therapeutic exercise;Patient/family education    PT Goals (Current goals can be found in the Care Plan section)  Acute Rehab PT Goals PT Goal Formulation: All assessment and education complete, DC therapy Time For Goal Achievement: 05/15/20    Frequency     Barriers to discharge        Co-evaluation               AM-PAC PT "6 Clicks" Mobility  Outcome Measure Help needed turning from your back to your side while in a flat bed without using bedrails?: None Help needed moving from lying on your back to sitting on the side of a flat bed without using bedrails?: None Help needed moving to and from a bed to a chair (including a wheelchair)?: None Help needed standing up from a chair using your arms (e.g., wheelchair or bedside chair)?: None Help needed to walk in hospital room?: None Help needed climbing 3-5 steps with a railing? : None 6 Click Score: 24    End of Session Equipment Utilized During Treatment: Oxygen Activity Tolerance: Patient tolerated treatment well;No increased pain Patient left: in chair;with call bell/phone within reach   PT Visit Diagnosis: Other abnormalities of gait and mobility (R26.89);Difficulty in walking, not elsewhere classified (R26.2)    Time: [id number] PT Time Calculation (min) (ACUTE ONLY): 26 min   Charges:   PT Evaluation $PT Eval Low Complexity: 1 Low          4:10 PM, 05/01/20 Etta Grandchild, PT, DPT Physical Therapist - [facility]  [phone] ([place])   [place] C 05/01/2020, 4:08 PM

## 2020-05-01 NOTE — Plan of Care (Signed)
Pt w/COPD exacerbation. On 4L here; on 3L chronic at home.

## 2020-05-01 NOTE — Care Management Important Message (Signed)
Important Message  Patient Details  Name: Ernest [name] MRN: [id number] Date of Birth: Dec 10, 1956   Medicare Important Message Given:  Yes     [name] 05/01/2020, 8:17 AM

## 2020-05-01 NOTE — Progress Notes (Addendum)
PROGRESS NOTE    Ernest [name]   [id number]  DOB: Apr 23, 1956  PCP: Tracie Harrier, MD    DOA: 04/29/2020 LOS: 2   Brief Narrative   Ernest [name]  is a 64 y.o. African-American male with a history of COPD and chronic respiratory failure on continuous 3 L/min home oxygen, type II diabetes mellitus and prostate cancer, who presented to ED on 04/29/20 with progressively worsening dyspnea, productive cough x 5 days that got remarkably worse today.  In the ED, patient was in significant respiratory distress and tachypneic, placed on Bipap.  Covid-19 negative.  Chest xray negative for acute abnormalities.  Patient was treated with Duonebs and IV steroids.  Admitted to hospitalist service for further management of acute on chronic hypoxic respiratory failure secondary to acute exacerbation of COPD.     Assessment & Plan   Principal Problem:   COPD exacerbation ([place]) Active Problems:   Acute on chronic respiratory failure with hypoxia (HCC)   Benign prostatic hyperplasia with lower urinary tract symptoms   Prostate cancer ([place])   Acute on chronic respiratory failure with hypoxia secondary to  COPD exacerbation - present on admission with respiratory distress, poor aeration and diffuse wheezing.  Improving but he still gets into distress with coughing. Chronically on 3 L/min supplemental oxygen. --Solu-medrol 60 mg IV - decrease to q12h --Duonebs q6h while awake, Albuterol nebs q3h PRN --Azithromycin --Continue Pulmicort, Brovana nebs --PRN antitussives --supplemental O2, maintain O2 sat > 88% --Bipap PRN --assess ambulatory O2 needs  Leukocytosis - due to steroids. No fevers or other evidence of infection.  Monitor CBC.  Type 2 Diabetes - sliding scale Novolog.  Hold metformin.  Chronic back pain - on Norco and Mobic at home, continued.  Benign prostatic hyperplasia - continue home meds  Depression/Anxiety - continue Zoloft and PRN Ativan  GERD - continue PPI  Prostate  cancer - no acute issues  DVT prophylaxis: Lovenox  Diet:  Diet Orders (From admission, onward)    Start     Ordered   04/30/20 0341  Diet Carb Modified Fluid consistency: Thin; Room service appropriate? Yes  Diet effective now    Question Answer Comment  Diet-HS Snack? Nothing   Calorie Level Medium 1600-2000   Fluid consistency: Thin   Room service appropriate? Yes      04/30/20 0340            Code Status: Full Code    Subjective 05/01/20    Patient seen up in chair this AM.  Reports he is feeling better.  Less wheezing, still has some coughing fits.  He had a severe coughing fit during physical exam, after deep inspiration.  No fever or chills or other complaints.  He says he needs to establish with local pulmonology and agrees with referral to Dr. Lanney Gins.   Disposition Plan & Communication   Status is: Inpatient  Remains inpatient appropriate because:IV treatments appropriate due to intensity of illness or inability to take PO   Dispo: The patient is from: Home              Anticipated d/c is to: Home              Anticipated d/c date is: 2 days              Patient currently is not medically stable to d/c.   Family Communication: none at bedside, will attempt to call    Consults, Procedures, Significant Events   Consultants:   none  Procedures:   none  Antimicrobials:   Zithromax 5/19 >>   Objective   Vitals:   04/30/20 1700 04/30/20 1753 05/01/20 0048 05/01/20 0835  BP: 111/76 114/63  (!) 128/110  Pulse: 80 (!) 58  60  Resp: 16 17  19   Temp:  98.2 F (36.8 C) 98.1 F (36.7 C) 98.1 F (36.7 C)  TempSrc:   Oral Oral  SpO2: 90% 96%  100%  Weight:      Height:        Intake/Output Summary (Last 24 hours) at 05/01/2020 1354 Last data filed at 05/01/2020 0615 Gross per 24 hour  Intake 1343.94 ml  Output 1550 ml  Net -206.06 ml   Filed Weights   04/29/20 2211  Weight: 102.1 kg    Physical Exam:  General exam: awake, alert, no  acute distress HEENT: moist mucus membranes, hearing grossly normal  Respiratory system: improved aeration, minimal wheezing, severe coughing fit triggered by deep inspiration during exam.  No rhonchi.  Much less conversational dyspnea today (prior to coughing spell). Cardiovascular system: normal S1/S2, RRR, no JVD, murmurs, rubs, gallops, no pedal edema.   Central nervous system: A&O x4. no gross focal neurologic deficits, normal speech Extremities: moves all, normal tone Psychiatry: normal mood, congruent affect, judgement and insight appear normal  Labs   Data Reviewed: I have personally reviewed following labs and imaging studies  CBC: Recent Labs  Lab 04/29/20 2213 04/30/20 0634 05/01/20 0630  WBC 10.7* 6.8 14.6*  NEUTROABS 5.4  --   --   HGB 15.7 14.9 15.1  HCT 48.5 46.9 47.4  MCV 78.2* 80.6 80.9  PLT 398 372 [id number]*   Basic Metabolic Panel: Recent Labs  Lab 04/29/20 2213 04/30/20 0634  NA 138 138  K 3.8 3.8  CL 106 109  CO2 20* 18*  GLUCOSE 101* 246*  BUN 24* 23  CREATININE 1.05 1.08  CALCIUM 9.0 8.6*   GFR: Estimated Creatinine Clearance: 86 mL/min (by C-G formula based on SCr of 1.08 mg/dL). Liver Function Tests: No results for input(s): AST, ALT, ALKPHOS, BILITOT, PROT, ALBUMIN in the last 168 hours. No results for input(s): LIPASE, AMYLASE in the last 168 hours. No results for input(s): AMMONIA in the last 168 hours. Coagulation Profile: No results for input(s): INR, PROTIME in the last 168 hours. Cardiac Enzymes: No results for input(s): CKTOTAL, CKMB, CKMBINDEX, TROPONINI in the last 168 hours. BNP (last 3 results) No results for input(s): PROBNP in the last 8760 hours. HbA1C: Recent Labs    04/30/20 0634  HGBA1C 6.7*   CBG: Recent Labs  Lab 04/30/20 1151 04/30/20 1751 04/30/20 2112 05/01/20 0756 05/01/20 1129  GLUCAP 180* 139* 144* 176* 157*   Lipid Profile: No results for input(s): CHOL, HDL, LDLCALC, TRIG, CHOLHDL, LDLDIRECT in the last  72 hours. Thyroid Function Tests: No results for input(s): TSH, T4TOTAL, FREET4, T3FREE, THYROIDAB in the last 72 hours. Anemia Panel: No results for input(s): VITAMINB12, FOLATE, FERRITIN, TIBC, IRON, RETICCTPCT in the last 72 hours. Sepsis Labs: No results for input(s): PROCALCITON, LATICACIDVEN in the last 168 hours.  Recent Results (from the past 240 hour(s))  SARS Coronavirus 2 by RT PCR (hospital order, performed in [facility] hospital lab) Nasopharyngeal Nasopharyngeal Swab     Status: None   Collection Time: 04/30/20  1:27 AM   Specimen: Nasopharyngeal Swab  Result Value Ref Range Status   SARS Coronavirus 2 NEGATIVE NEGATIVE Final    Comment: (NOTE) SARS-CoV-2 target nucleic acids are NOT DETECTED.  The SARS-CoV-2 RNA is generally detectable in upper and lower respiratory specimens during the acute phase of infection. The lowest concentration of SARS-CoV-2 viral copies this assay can detect is 250 copies / mL. A negative result does not preclude SARS-CoV-2 infection and should not be used as the sole basis for treatment or other patient management decisions.  A negative result may occur with improper specimen collection / handling, submission of specimen other than nasopharyngeal swab, presence of viral mutation(s) within the areas targeted by this assay, and inadequate number of viral copies (<250 copies / mL). A negative result must be combined with clinical observations, patient history, and epidemiological information. Fact Sheet for Patients:   StrictlyIdeas.no Fact Sheet for Healthcare Providers: BankingDealers.co.za This test is not yet approved or cleared  by the [place] FDA and has been authorized for detection and/or diagnosis of SARS-CoV-2 by FDA under an Emergency Use Authorization (EUA).  This EUA will remain in effect (meaning this test can be used) for the duration of the COVID-19 declaration under Section  564(b)(1) of the Act, 21 U.S.C. section 360bbb-3(b)(1), unless the authorization is terminated or revoked sooner. Performed at [facility], [address] [place], [place] [address]       Imaging Studies   DG Chest Portable 1 View  Result Date: 04/29/2020 CLINICAL DATA:  Shortness of breath, COPD EXAM: PORTABLE CHEST 1 VIEW COMPARISON:  07/29/2018 FINDINGS: Single frontal view of the chest demonstrates an unremarkable cardiac silhouette. Subsegmental atelectasis or scarring at the lung bases, stable. No airspace disease, effusion, or pneumothorax. No acute bony abnormality. IMPRESSION: 1. Stable exam, no acute process. Electronically Signed   By: Randa Ngo M.D.   On: 04/29/2020 22:40     Medications   Scheduled Meds: . alfuzosin  10 mg Oral Q breakfast  . arformoterol  15 mcg Nebulization BID  . azithromycin  500 mg Oral Daily  . budesonide (PULMICORT) nebulizer solution  0.25 mg Nebulization BID  . enoxaparin (LOVENOX) injection  40 mg Subcutaneous Q24H  . finasteride  5 mg Oral QHS  . guaiFENesin  600 mg Oral BID  . insulin aspart  0-15 Units Subcutaneous TID PC & HS  . ipratropium-albuterol  3 mL Nebulization QID  . meloxicam  15 mg Oral QHS  . methylPREDNISolone (SOLU-MEDROL) injection  60 mg Intravenous Q12H  . pantoprazole  40 mg Oral Daily  . sertraline  50 mg Oral Daily   Continuous Infusions: . cefTRIAXone (ROCEPHIN)  IV Stopped (04/30/20 2317)       LOS: 2 days    Time spent: 30 minutes    Ezekiel Slocumb, DO [facility]  05/01/2020, 1:54 PM    If 7PM-7AM, please contact night-coverage. How to contact the [facility] Attending or Consulting provider [place] or covering provider during after hours [place], for this patient?    1. Check the care team in [facility] and look for a) attending/consulting [facility] provider listed and b) the [facility] team listed 2. Log into [email or web address] and use [place]'s universal password to access. If you do not have  the password, please contact the hospital operator. 3. Locate the [facility] provider you are looking for under [facility] and page to a number that you can be directly reached. 4. If you still have difficulty reaching the provider, please page the [facility] (Director on Call) for the Hospitalists listed on amion for assistance.

## 2020-05-02 ENCOUNTER — Inpatient Hospital Stay: Payer: Medicare HMO

## 2020-05-02 LAB — CBC
HCT: 44.7 % (ref 39.0–52.0)
Hemoglobin: 14.7 g/dL (ref 13.0–17.0)
MCH: 25.9 pg — ABNORMAL LOW (ref 26.0–34.0)
MCHC: 32.9 g/dL (ref 30.0–36.0)
MCV: 78.7 fL — ABNORMAL LOW (ref 80.0–100.0)
Platelets: 386 10*3/uL (ref 150–400)
RBC: 5.68 MIL/uL (ref 4.22–5.81)
RDW: 17.2 % — ABNORMAL HIGH (ref 11.5–15.5)
WBC: 11.6 10*3/uL — ABNORMAL HIGH (ref 4.0–10.5)
nRBC: 0 % (ref 0.0–0.2)

## 2020-05-02 LAB — GLUCOSE, CAPILLARY
Glucose-Capillary: 150 mg/dL — ABNORMAL HIGH (ref 70–99)
Glucose-Capillary: 218 mg/dL — ABNORMAL HIGH (ref 70–99)

## 2020-05-02 MED ORDER — LORATADINE 10 MG PO TABS
10.0000 mg | ORAL_TABLET | Freq: Every day | ORAL | Status: DC
  Administered 2020-05-02: 10 mg via ORAL
  Filled 2020-05-02: qty 1

## 2020-05-02 MED ORDER — BENZONATATE 100 MG PO CAPS: 100.0000 mg | ORAL_CAPSULE | Freq: Three times a day (TID) | ORAL | 0 refills | Status: DC | PRN

## 2020-05-02 MED ORDER — HYDROCOD POLST-CPM POLST ER 10-8 MG/5ML PO SUER: 5.0000 mL | Freq: Two times a day (BID) | ORAL | 0 refills | Status: DC | PRN

## 2020-05-02 MED ORDER — GUAIFENESIN ER 600 MG PO TB12: 600.0000 mg | ORAL_TABLET | Freq: Two times a day (BID) | ORAL | 0 refills | Status: AC

## 2020-05-02 MED ORDER — LORATADINE 10 MG PO TABS: 10.0000 mg | ORAL_TABLET | Freq: Every day | ORAL | 2 refills | Status: DC

## 2020-05-02 MED ORDER — SODIUM CHLORIDE 0.9 % IV SOLN
1.0000 g | Freq: Once | INTRAVENOUS | Status: AC
  Administered 2020-05-02: 04:00:00 1 g via INTRAVENOUS
  Filled 2020-05-02: qty 10

## 2020-05-02 MED ORDER — PREDNISONE 20 MG PO TABS: ORAL_TABLET | ORAL | 0 refills | Status: AC

## 2020-05-02 MED ORDER — ALBUTEROL SULFATE HFA 108 (90 BASE) MCG/ACT IN AERS: 2.0000 | INHALATION_SPRAY | RESPIRATORY_TRACT | 0 refills | Status: AC | PRN

## 2020-05-02 MED ORDER — SODIUM CHLORIDE 0.9 % IV SOLN
2.0000 g | Freq: Once | INTRAVENOUS | Status: DC
  Filled 2020-05-02: qty 20

## 2020-05-02 NOTE — Discharge Instructions (Signed)
Chronic Obstructive Pulmonary Disease Chronic obstructive pulmonary disease (COPD) is a long-term (chronic) lung problem. When you have COPD, it is hard for air to get in and out of your lungs. Usually the condition gets worse over time, and your lungs will never return to normal. There are things you can do to keep yourself as healthy as possible.  Your doctor may treat your condition with: ? Medicines. ? Oxygen. ? Lung surgery.  Your doctor may also recommend: ? Rehabilitation. This includes steps to make your body work better. It may involve a team of specialists. ? Quitting smoking, if you smoke. ? Exercise and changes to your diet. ? Comfort measures (palliative care). Follow these instructions at home: Medicines  Take over-the-counter and prescription medicines only as told by your doctor.  Talk to your doctor before taking any cough or allergy medicines. You may need to avoid medicines that cause your lungs to be dry. Lifestyle  If you smoke, stop. Smoking makes the problem worse. If you need help quitting, ask your doctor.  Avoid being around things that make your breathing worse. This may include smoke, chemicals, and fumes.  Stay active, but remember to rest as well.  Learn and use tips on how to relax.  Make sure you get enough sleep. Most adults need at least 7 hours of sleep every night.  Eat healthy foods. Eat smaller meals more often. Rest before meals. Controlled breathing Learn and use tips on how to control your breathing as told by your doctor. Try:  Breathing in (inhaling) through your nose for 1 second. Then, pucker your lips and breath out (exhale) through your lips for 2 seconds.  Putting one hand on your belly (abdomen). Breathe in slowly through your nose for 1 second. Your hand on your belly should move out. Pucker your lips and breathe out slowly through your lips. Your hand on your belly should move in as you breathe out.  Controlled coughing Learn  and use controlled coughing to clear mucus from your lungs. Follow these steps: 1. Lean your head a little forward. 2. Breathe in deeply. 3. Try to hold your breath for 3 seconds. 4. Keep your mouth slightly open while coughing 2 times. 5. Spit any mucus out into a tissue. 6. Rest and do the steps again 1 or 2 times as needed. General instructions  Make sure you get all the shots (vaccines) that your doctor recommends. Ask your doctor about a flu shot and a pneumonia shot.  Use oxygen therapy and pulmonary rehabilitation if told by your doctor. If you need home oxygen therapy, ask your doctor if you should buy a tool to measure your oxygen level (oximeter).  Make a COPD action plan with your doctor. This helps you to know what to do if you feel worse than usual.  Manage any other conditions you have as told by your doctor.  Avoid going outside when it is very hot, cold, or humid.  Avoid people who have a sickness you can catch (contagious).  Keep all follow-up visits as told by your doctor. This is important. Contact a doctor if:  You cough up more mucus than usual.  There is a change in the color or thickness of the mucus.  It is harder to breathe than usual.  Your breathing is faster than usual.  You have trouble sleeping.  You need to use your medicines more often than usual.  You have trouble doing your normal activities such as getting dressed   or walking around the house. Get help right away if:  You have shortness of breath while resting.  You have shortness of breath that stops you from: ? Being able to talk. ? Doing normal activities.  Your chest hurts for longer than 5 minutes.  Your skin color is more blue than usual.  Your pulse oximeter shows that you have low oxygen for longer than 5 minutes.  You have a fever.  You feel too tired to breathe normally. Summary  Chronic obstructive pulmonary disease (COPD) is a long-term lung problem.  The way your  lungs work will never return to normal. Usually the condition gets worse over time. There are things you can do to keep yourself as healthy as possible.  Take over-the-counter and prescription medicines only as told by your doctor.  If you smoke, stop. Smoking makes the problem worse. This information is not intended to replace advice given to you by your health care provider. Make sure you discuss any questions you have with your health care provider. Document Revised: 11/10/2017 Document Reviewed: 01/02/2017 Elsevier Patient Education  2020 [employer]  

## 2020-05-02 NOTE — Progress Notes (Signed)
Plan of care reviewed; medications discussed prior to administration; continued BG monitoring; continued IV antibiotics; pt on 3L o2; no complaints of pain; safety precautions maintained.

## 2020-05-02 NOTE — Discharge Summary (Signed)
Physician Discharge Summary  Ernest [name] DOB: [birth year] DOA: 04/29/2020  PCP: Tracie Harrier, MD  Admit date: 04/29/2020 Discharge date: 05/02/2020  Admitted From: home Disposition:  home  Recommendations for Outpatient Follow-up:  1. Follow up with PCP in 1-2 weeks 2. Please obtain BMP/CBC in one week 3. Please follow up on the following pulmonology as soon as possible for ongoing COPD management  Home Health: No  Equipment/Devices: None, already has oxygen   Discharge Condition: Stable  CODE STATUS: Full  Diet recommendation: Carb Modified    Discharge Diagnoses: Principal Problem:   COPD exacerbation ([place]) Active Problems:   Acute on chronic respiratory failure with hypoxia ([place])   Benign prostatic hyperplasia with lower urinary tract symptoms   Prostate cancer ([place])    Summary of HPI and Hospital Course:  Ernest [name]  is a 64 y.o. African-American male with a history of COPD and chronic respiratory failure on continuous 3 L/min home oxygen, type II diabetes mellitus and prostate cancer, who presented to ED on 04/29/20 with progressively worsening dyspnea, productive cough x 5 days that got remarkably worse today.  In the ED, patient was in significant respiratory distress and tachypneic, placed on Bipap.  Covid-19 negative.  Chest xray negative for acute abnormalities.  Patient was treated with Duonebs and IV steroids.  Admitted to hospitalist service for further management of acute on chronic hypoxic respiratory failure secondary to acute exacerbation of COPD.    Acute on chronic respiratory failure with hypoxia secondary to  COPD exacerbation - present on admission with respiratory distress, poor aeration and diffuse wheezing.  Improving but he still gets into distress with coughing which he reports is his baseline.   Chronically on 3 L/min supplemental oxygen. Treated with IV Solu-medrol.  Transitioned to Prednisone and discharged on 6 day taper. Continued  on prior home regimen of inhalers/nebs. PRN antitussives, prescribed on d/c supplemental O2, maintain O2 sat > 88%  Leukocytosis - due to steroids. No fevers or other evidence of infection.  Monitor CBC.  Type 2 Diabetes - sliding scale Novolog.  Resumed metformin on d/c.  Chronic back pain - on Norco and Mobic at home, continued.  Benign prostatic hyperplasia - continue home meds  Depression/Anxiety - continue Zoloft and PRN Ativan  GERD - continue PPI  Prostate cancer - no acute issues   Discharge Instructions   Discharge Instructions    Call MD for:   Complete by: As directed    Worsening shortness of breath or wheezing, or needing to use more oxygen to keep level above 88%.   Call MD for:  extreme fatigue   Complete by: As directed    Call MD for:  temperature >100.4   Complete by: As directed    Diet - low sodium heart healthy   Complete by: As directed    Discharge instructions   Complete by: As directed    Take Prednisone taper as prescribed over next 6 days.  Referral to pulmonologist, Dr. Lanney Gins, has been placed.  Please follow up with him for ongoing care for COPD.   Increase activity slowly   Complete by: As directed      Allergies as of 05/02/2020   No Known Allergies     Medication List    STOP taking these medications   azithromycin 250 MG tablet Commonly known as: ZITHROMAX   furosemide 20 MG tablet Commonly known as: LASIX     TAKE these medications   albuterol 108 (90 Base) MCG/ACT inhaler  Commonly known as: VENTOLIN HFA Inhale 2 puffs into the lungs every 4 (four) hours as needed for wheezing or shortness of breath. What changed: when to take this   alfuzosin 10 MG 24 hr tablet Commonly known as: UROXATRAL Take 1 tablet (10 mg total) by mouth daily with breakfast.   arformoterol 15 MCG/2ML Nebu Commonly known as: BROVANA Take 15 mcg by nebulization 2 (two) times daily.   benzonatate 100 MG capsule Commonly known as:  TESSALON Take 1 capsule (100 mg total) by mouth 3 (three) times daily as needed for cough.   budesonide 0.5 MG/2ML nebulizer solution Commonly known as: PULMICORT Take 0.5 mg by nebulization 2 (two) times daily.   budesonide-formoterol 160-4.5 MCG/ACT inhaler Commonly known as: Symbicort Inhale 2 puffs into the lungs 2 (two) times daily.   chlorpheniramine-HYDROcodone 10-8 MG/5ML Suer Commonly known as: TUSSIONEX Take 5 mLs by mouth every 12 (twelve) hours as needed for cough.   finasteride 5 MG tablet Commonly known as: PROSCAR Take 5 mg by mouth daily.   guaiFENesin 600 MG 12 hr tablet Commonly known as: MUCINEX Take 1 tablet (600 mg total) by mouth 2 (two) times daily for 7 days.   HYDROcodone-acetaminophen 5-325 MG tablet Commonly known as: NORCO/VICODIN Take 1 tablet by mouth every 6 (six) hours as needed.   ipratropium-albuterol 0.5-2.5 (3) MG/3ML Soln Commonly known as: DUONEB Take 3 mLs by nebulization every 4 (four) hours as needed (SOB, wheezing). What changed: when to take this   loratadine 10 MG tablet Commonly known as: CLARITIN Take 1 tablet (10 mg total) by mouth daily. Start taking on: May 03, 2020   LORazepam 0.5 MG tablet Commonly known as: Ativan Take 1 every 12 hours as needed for anxiety   meloxicam 15 MG tablet Commonly known as: MOBIC Take 15 mg by mouth daily.   metFORMIN 500 MG tablet Commonly known as: GLUCOPHAGE Take 500 mg by mouth every morning.   omeprazole 40 MG capsule Commonly known as: PRILOSEC Take 40 mg by mouth daily.   predniSONE 20 MG tablet Commonly known as: DELTASONE Take 2 tablets (40 mg total) by mouth daily with breakfast for 2 days, THEN 1 tablet (20 mg total) daily with breakfast for 2 days, THEN 0.5 tablets (10 mg total) daily with breakfast for 2 days. Start taking on: May 02, 2020   sertraline 50 MG tablet Commonly known as: ZOLOFT Take 50 mg by mouth daily.   sildenafil 20 MG tablet Commonly known as:  REVATIO Take 20 mg by mouth daily as needed.   silodosin 8 MG Caps capsule Commonly known as: RAPAFLO Take 8 mg by mouth at bedtime.       No Known Allergies  Consultations:  none    Procedures/Studies: DG Chest Port 1 View  Result Date: 05/02/2020 CLINICAL DATA:  Cough and shortness of breath EXAM: PORTABLE CHEST 1 VIEW COMPARISON:  04/29/2020 FINDINGS: Cardiac shadow is stable. The lungs are well aerated bilaterally. Bibasilar atelectasis is seen similar to that noted on the prior exam. No pneumothorax or focal confluent infiltrate is seen. No effusion is noted. IMPRESSION: Stable bibasilar atelectasis. Electronically Signed   By: Inez Catalina M.D.   On: 05/02/2020 08:40   DG Chest Portable 1 View  Result Date: 04/29/2020 CLINICAL DATA:  Shortness of breath, COPD EXAM: PORTABLE CHEST 1 VIEW COMPARISON:  07/29/2018 FINDINGS: Single frontal view of the chest demonstrates an unremarkable cardiac silhouette. Subsegmental atelectasis or scarring at the lung bases, stable. No airspace disease,  effusion, or pneumothorax. No acute bony abnormality. IMPRESSION: 1. Stable exam, no acute process. Electronically Signed   By: Randa Ngo M.D.   On: 04/29/2020 22:40       Subjective: Patient reports feeling better.  Less wheezing.  Had a coughing spell after walking with PT yesterday.  Says he gets fits like this fairly often at baseline as well.  No fever/chills or other acute complaints.  Agreeable with d/c and pulmonology follow up.   Discharge Exam: Vitals:   05/02/20 0759 05/02/20 0802  BP: (!) 142/79 127/77  Pulse: 82 73  Resp: 15 16  Temp: 97.9 F (36.6 C)   SpO2: 93% 94%   Vitals:   05/02/20 0723 05/02/20 0746 05/02/20 0759 05/02/20 0802  BP:  (!) 202/184 (!) 142/79 127/77  Pulse:  79 82 73  Resp:  16 15 16   Temp:  97.8 F (36.6 C) 97.9 F (36.6 C)   TempSrc:  Oral Oral   SpO2: 96% 95% 93% 94%  Weight:      Height:        General: Pt is alert, awake, not in  acute distress Cardiovascular: RRR, S1/S2 +, no rubs, no gallops Respiratory: improved aeration, no wheezing, no rhonchi, no respiratory distress Abdominal: Soft, NT, ND, bowel sounds + Extremities: no edema, no cyanosis    The results of significant diagnostics from this hospitalization (including imaging, microbiology, ancillary and laboratory) are listed below for reference.     Microbiology: Recent Results (from the past 240 hour(s))  SARS Coronavirus 2 by RT PCR (hospital order, performed in [facility] hospital lab) Nasopharyngeal Nasopharyngeal Swab     Status: None   Collection Time: 04/30/20  1:27 AM   Specimen: Nasopharyngeal Swab  Result Value Ref Range Status   SARS Coronavirus 2 NEGATIVE NEGATIVE Final    Comment: (NOTE) SARS-CoV-2 target nucleic acids are NOT DETECTED. The SARS-CoV-2 RNA is generally detectable in upper and lower respiratory specimens during the acute phase of infection. The lowest concentration of SARS-CoV-2 viral copies this assay can detect is 250 copies / mL. A negative result does not preclude SARS-CoV-2 infection and should not be used as the sole basis for treatment or other patient management decisions.  A negative result may occur with improper specimen collection / handling, submission of specimen other than nasopharyngeal swab, presence of viral mutation(s) within the areas targeted by this assay, and inadequate number of viral copies (<250 copies / mL). A negative result must be combined with clinical observations, patient history, and epidemiological information. Fact Sheet for Patients:   StrictlyIdeas.no Fact Sheet for Healthcare Providers: BankingDealers.co.za This test is not yet approved or cleared  by the [place] FDA and has been authorized for detection and/or diagnosis of SARS-CoV-2 by FDA under an Emergency Use Authorization (EUA).  This EUA will remain in effect (meaning  this test can be used) for the duration of the COVID-19 declaration under Section 564(b)(1) of the Act, 21 U.S.C. section 360bbb-3(b)(1), unless the authorization is terminated or revoked sooner. Performed at [facility], [address] [place], [place] [address]      Labs: BNP (last 3 results) No results for input(s): BNP in the last 8760 hours. Basic Metabolic Panel: Recent Labs  Lab 04/29/20 2213 04/30/20 0634  NA 138 138  K 3.8 3.8  CL 106 109  CO2 20* 18*  GLUCOSE 101* 246*  BUN 24* 23  CREATININE 1.05 1.08  CALCIUM 9.0 8.6*   Liver Function Tests:  No results for input(s): AST, ALT, ALKPHOS, BILITOT, PROT, ALBUMIN in the last 168 hours. No results for input(s): LIPASE, AMYLASE in the last 168 hours. No results for input(s): AMMONIA in the last 168 hours. CBC: Recent Labs  Lab 04/29/20 2213 04/30/20 0634 05/01/20 0630 05/02/20 0411  WBC 10.7* 6.8 14.6* 11.6*  NEUTROABS 5.4  --   --   --   HGB 15.7 14.9 15.1 14.7  HCT 48.5 46.9 47.4 44.7  MCV 78.2* 80.6 80.9 78.7*  PLT 398 372 404* 386   Cardiac Enzymes: No results for input(s): CKTOTAL, CKMB, CKMBINDEX, TROPONINI in the last 168 hours. BNP: Invalid input(s): POCBNP CBG: Recent Labs  Lab 05/01/20 0756 05/01/20 1129 05/01/20 1703 05/01/20 2149 05/02/20 0749  GLUCAP 176* 157* 187* 318* 150*   D-Dimer No results for input(s): DDIMER in the last 72 hours. Hgb A1c Recent Labs    04/30/20 0634  HGBA1C 6.7*   Lipid Profile No results for input(s): CHOL, HDL, LDLCALC, TRIG, CHOLHDL, LDLDIRECT in the last 72 hours. Thyroid function studies No results for input(s): TSH, T4TOTAL, T3FREE, THYROIDAB in the last 72 hours.  Invalid input(s): FREET3 Anemia work up No results for input(s): VITAMINB12, FOLATE, FERRITIN, TIBC, IRON, RETICCTPCT in the last 72 hours. Urinalysis    Component Value Date/Time   COLORURINE YELLOW (A) 07/29/2018 1917   APPEARANCEUR CLEAR (A) 07/29/2018 1917    APPEARANCEUR Clear 10/04/2017 1106   LABSPEC 1.013 07/29/2018 [place] 7.0 07/29/2018 [place] NEGATIVE 07/29/2018 [place] NEGATIVE 07/29/2018 [place] NEGATIVE 07/29/2018 1917   BILIRUBINUR Negative 10/04/2017 1106   KETONESUR NEGATIVE 07/29/2018 1917   PROTEINUR NEGATIVE 07/29/2018 1917   NITRITE NEGATIVE 07/29/2018 1917   LEUKOCYTESUR NEGATIVE 07/29/2018 1917   LEUKOCYTESUR Negative 10/04/2017 1106   Sepsis Labs Invalid input(s): PROCALCITONIN,  WBC,  LACTICIDVEN Microbiology Recent Results (from the past 240 hour(s))  SARS Coronavirus 2 by RT PCR (hospital order, performed in [facility] hospital lab) Nasopharyngeal Nasopharyngeal Swab     Status: None   Collection Time: 04/30/20  1:27 AM   Specimen: Nasopharyngeal Swab  Result Value Ref Range Status   SARS Coronavirus 2 NEGATIVE NEGATIVE Final    Comment: (NOTE) SARS-CoV-2 target nucleic acids are NOT DETECTED. The SARS-CoV-2 RNA is generally detectable in upper and lower respiratory specimens during the acute phase of infection. The lowest concentration of SARS-CoV-2 viral copies this assay can detect is 250 copies / mL. A negative result does not preclude SARS-CoV-2 infection and should not be used as the sole basis for treatment or other patient management decisions.  A negative result may occur with improper specimen collection / handling, submission of specimen other than nasopharyngeal swab, presence of viral mutation(s) within the areas targeted by this assay, and inadequate number of viral copies (<250 copies / mL). A negative result must be combined with clinical observations, patient history, and epidemiological information. Fact Sheet for Patients:   StrictlyIdeas.no Fact Sheet for Healthcare Providers: BankingDealers.co.za This test is not yet approved or cleared  by the [place] FDA and has been authorized for detection and/or  diagnosis of SARS-CoV-2 by FDA under an Emergency Use Authorization (EUA).  This EUA will remain in effect (meaning this test can be used) for the duration of the COVID-19 declaration under Section 564(b)(1) of the Act, 21 U.S.C. section 360bbb-3(b)(1), unless the authorization is terminated or revoked sooner. Performed at [facility], [address], [place], [place] [address]  Time coordinating discharge: Over 30 minutes  SIGNED:   Ezekiel Slocumb, DO [facility] 05/02/2020, 11:01 AM   If 7PM-7AM, please contact night-coverage [email or web address]

## 2020-05-02 NOTE — Progress Notes (Signed)
   05/02/20 0746  Vitals  Temp 97.8 F (36.6 C)  Temp Source Oral  BP (!) 202/184  MAP (mmHg) 189  Pulse Rate 79  ECG Heart Rate 71  Resp 16  Oxygen Therapy  SpO2 95 %  MEWS Score  MEWS Temp 0  MEWS Systolic 2  MEWS Pulse 0  MEWS RR 0  MEWS LOC 0  MEWS Score 2  MEWS Score Color Yellow  rechecked BP .Marland Kitchencurrenly 127/77. Increase due to breathing treatment

## 2020-06-17 ENCOUNTER — Other Ambulatory Visit: Payer: Self-pay

## 2020-06-17 ENCOUNTER — Encounter: Payer: Self-pay | Admitting: Urology

## 2020-06-17 ENCOUNTER — Ambulatory Visit (INDEPENDENT_AMBULATORY_CARE_PROVIDER_SITE_OTHER): Payer: Medicare HMO | Admitting: Urology

## 2020-06-17 VITALS — BP 141/82 | HR 94 | Ht 75.0 in | Wt 241.4 lb

## 2020-06-17 DIAGNOSIS — C61 Malignant neoplasm of prostate: Secondary | ICD-10-CM | POA: Diagnosis not present

## 2020-06-17 NOTE — Progress Notes (Signed)
06/17/2020 5:13 PM   [name] 1955/12/24 [id number]  Referring provider: Tracie Harrier, MD [address] [facility] [place],  [place] [address] Chief Complaint  Patient presents with  . Elevated PSA    HPI: Ernest [name] is a 64 y.o. 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   Patient was followed by Dr. Bernardo Heater in 2019 for surveillance regarding prostate cancer.   Patient was lost to follow up.   Most recent PSA is trending upwards to 11.30 as of 02/05/2020.  PSA Trend: 06/25/2018: 8.7.  06/19/2019: 10.21. 12/19/2019: 9.91. 02/04/2020: 10.45. 02/05/2020: 11.30.   Patient was admitted to [facility] from 04/29/2020 to 05/02/2020. He had a progressively worsening dyspnea, productive cough x 5 days. Patient was in ignificant respiratory distress and tachypneic, placed on Bipap.  Covid-19 negative.  Chest xray negative for acute abnormalities.  Patient was treated with Duonebs and IV steroids.   He is not voiding well. He feels like he has to force himself to urinate. His has a good stream. He feels like he is not emptying his bladder.  He also has significant nocturia x3 and daytime frequency.  No dysuria.  No gross hematuria.  He is currently on finasteride and Rapaflo.  He has osteosclerosis and a herniated disc which contribute to severe back pain.    He has been living in [place] for the past 2 years.  He recently returned and is reestablishing care in the area.  PMH: Past Medical History:  Diagnosis Date  . Acute on chronic respiratory failure with hypoxia ([place]) 05/25/2017  . Acute respiratory failure with hypoxia ([place]) 05/25/2017  . COPD (chronic obstructive pulmonary disease) ([place])   . COPD with acute bronchitis ([place]) 05/25/2017  . COPD with acute exacerbation ([place]) 05/25/2017  . Diabetes mellitus without complication ([place])   . Leukocytosis 05/25/2017  . Prostate  cancer [facility])     Surgical History: Past Surgical History:  Procedure Laterality Date  . TESTICLE SURGERY     undecended testicle repair    Home Medications:  Allergies as of 06/17/2020   No Known Allergies     Medication List       Accurate as of June 17, 2020  5:13 PM. If you have any questions, ask your nurse or doctor.        STOP taking these medications   alfuzosin 10 MG 24 hr tablet Commonly known as: UROXATRAL Stopped by: Hollice Espy, MD     TAKE these medications   albuterol 108 (90 Base) MCG/ACT inhaler Commonly known as: VENTOLIN HFA Inhale 2 puffs into the lungs every 4 (four) hours as needed for wheezing or shortness of breath.   arformoterol 15 MCG/2ML Nebu Commonly known as: BROVANA Take 15 mcg by nebulization 2 (two) times daily.   azithromycin 250 MG tablet Commonly known as: ZITHROMAX for COPD Exacerbation. TAKE 1 TABLET BY MOUTH ONCE DAILY TO  PREVENT  COPD  EXACERBATIONS.   benzonatate 100 MG capsule Commonly known as: TESSALON Take 1 capsule (100 mg total) by mouth 3 (three) times daily as needed for cough.   budesonide 0.5 MG/2ML nebulizer solution Commonly known as: PULMICORT Take 0.5 mg by nebulization 2 (two) times daily.   budesonide-formoterol 160-4.5 MCG/ACT inhaler Commonly known as: Symbicort Inhale 2 puffs into the lungs 2 (two) times daily.   chlorpheniramine-HYDROcodone 10-8 MG/5ML Suer Commonly known as: TUSSIONEX Take 5 mLs by mouth every 12 (  twelve) hours as needed for cough.   finasteride 5 MG tablet Commonly known as: PROSCAR Take 5 mg by mouth daily.   gabapentin 100 MG capsule Commonly known as: NEURONTIN Take by mouth.   HYDROcodone-acetaminophen 5-325 MG tablet Commonly known as: NORCO/VICODIN Take 1 tablet by mouth every 6 (six) hours as needed.   ipratropium 0.02 % nebulizer solution Commonly known as: ATROVENT Inhale into the lungs.   ipratropium-albuterol 0.5-2.5 (3) MG/3ML Soln Commonly known as:  DUONEB Take 3 mLs by nebulization every 4 (four) hours as needed (SOB, wheezing). What changed: when to take this   loratadine 10 MG tablet Commonly known as: CLARITIN Take 1 tablet (10 mg total) by mouth daily.   LORazepam 0.5 MG tablet Commonly known as: Ativan Take 1 every 12 hours as needed for anxiety   meloxicam 15 MG tablet Commonly known as: MOBIC Take 15 mg by mouth daily.   metFORMIN 500 MG tablet Commonly known as: GLUCOPHAGE Take 500 mg by mouth every morning.   nicotine 14 mg/24hr patch Commonly known as: NICODERM CQ - dosed in mg/24 hours Place onto the skin.   omeprazole 40 MG capsule Commonly known as: PRILOSEC Take 40 mg by mouth daily.   predniSONE 10 MG tablet Commonly known as: DELTASONE Take by mouth.   sertraline 50 MG tablet Commonly known as: ZOLOFT Take 50 mg by mouth daily.   sildenafil 20 MG tablet Commonly known as: REVATIO Take 20 mg by mouth daily as needed.   silodosin 8 MG Caps capsule Commonly known as: RAPAFLO Take 8 mg by mouth at bedtime.       Allergies: No Known Allergies  Family History: Family History  Problem Relation Age of Onset  . Stroke Mother   . Breast cancer Mother   . Bladder Cancer Neg Hx   . Kidney cancer Neg Hx   . Prostate cancer Neg Hx     Social History:  reports that he has been smoking cigarettes. He has been smoking about 0.50 packs per day. He has never used smokeless tobacco. He reports current alcohol use. He reports that he does not use drugs.   Physical Exam: BP (!) 141/82 (BP Location: Left Arm, Patient Position: Sitting, Cuff Size: Large)   Pulse 94   Ht 6\' 3"  (1.905 m)   Wt 241 lb 6.4 oz (109.5 kg)   BMI 30.17 kg/m   Constitutional: Standing, uncomfortable in appearance, overall unwell HEENT: [place] AT, moist mucus membranes.  Trachea midline, no masses. Cardiovascular: No clubbing, cyanosis, or edema. Respiratory: Mildly tachypneic, wearing oxygen Skin: No rashes, bruises or  suspicious lesions. Neurologic: Grossly intact, no focal deficits, moving all 4 extremities. Psychiatric: Normal mood and affect.  Laboratory Data:  Lab Results  Component Value Date   CREATININE 1.08 04/30/2020     Lab Results  Component Value Date   HGBA1C 6.7 (H) 04/30/2020   PVR minimal  Assessment & Plan:    1. 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  He is not a good surgical candidate for outlet procedure due to severe comorbidity.  Based on his severe lung disease, oxygen requirement and overall poor health, risks of pursuing further diagnostic work-up do not outweigh the benefits of further intervention and/or treatment at this time  due to overall life expectancy.  Would most strongly recommend to continue conservative management.  This sentiment has also been shared by his urologist in [place] as well as Dr. Bernardo Heater.  Would recommend PSA again in 6 months and continued conservative management.  2. BPH with urinary frequency  Continue finasteride  Continue Rapaflo He is optimized from a BPH standpoint but he continues to have irritative voiding symptoms  Again, he is a poor surgical candidate thus we will try to control his irritative urinary symptoms including daytime frequency and nocturia  Given the empties his bladder well, we will try Myrbetriq 50 mg x 1 month as a trial.  He will let us know how this goes if it improves his symptoms.  If it does, the like him to return for bladder scan to ensure that he is emptying on this medication we will fill the prescription at that time.  He was in a MyChart message or call to let us know.   Return in 6 months for PSA.   [facility] [address] [place], [place] [address] [phone]  I, Selena Batten, am acting as a scribe for Dr. Hollice Espy.  I have reviewed the above documentation for accuracy and completeness, and I agree with the above.   Hollice Espy, MD  I spent 40 min with this patient of which greater than 50% was spent in counseling and coordination of care with the patient.

## 2020-07-18 ENCOUNTER — Other Ambulatory Visit (HOSPITAL_COMMUNITY): Payer: Self-pay | Admitting: Nurse Practitioner

## 2020-07-18 ENCOUNTER — Other Ambulatory Visit: Payer: Self-pay | Admitting: Nurse Practitioner

## 2020-07-18 DIAGNOSIS — M5412 Radiculopathy, cervical region: Secondary | ICD-10-CM

## 2020-07-18 DIAGNOSIS — M5416 Radiculopathy, lumbar region: Secondary | ICD-10-CM

## 2020-07-18 DIAGNOSIS — G8929 Other chronic pain: Secondary | ICD-10-CM

## 2020-07-30 ENCOUNTER — Emergency Department: Admission: EM | Admit: 2020-07-30 | Discharge: 2020-07-30 | Discharge status: Against Medical Advice | Payer: MEDICARE

## 2020-07-30 NOTE — ED Notes (Signed)
Pt reports he will see PMD

## 2020-07-30 NOTE — ED Triage Notes (Signed)
Pt in to triage room and states he has to leave he cannot stay here and wait and have to wear a mask. Encouraged pt to stay and be seen for his back pain but pt refused and was apologetic to RN for leaving. Pt will follow up with PMD

## 2020-08-07 ENCOUNTER — Other Ambulatory Visit: Payer: Self-pay

## 2020-08-07 ENCOUNTER — Ambulatory Visit
Admission: RE | Admit: 2020-08-07 | Discharge: 2020-08-07 | Disposition: A | Discharge status: Home / Self Care | Payer: Medicare HMO | Source: Ambulatory Visit | Attending: Nurse Practitioner | Admitting: Nurse Practitioner

## 2020-08-07 DIAGNOSIS — M5441 Lumbago with sciatica, right side: Secondary | ICD-10-CM | POA: Insufficient documentation

## 2020-08-07 DIAGNOSIS — M5412 Radiculopathy, cervical region: Secondary | ICD-10-CM | POA: Diagnosis present

## 2020-08-07 DIAGNOSIS — G8929 Other chronic pain: Secondary | ICD-10-CM | POA: Insufficient documentation

## 2020-08-07 DIAGNOSIS — M5416 Radiculopathy, lumbar region: Secondary | ICD-10-CM | POA: Diagnosis present

## 2020-09-01 ENCOUNTER — Telehealth: Payer: Self-pay

## 2020-09-01 NOTE — Telephone Encounter (Signed)
Called and talked with patient . Confirmed his appat and instructed him to bring h&p form and if he did not received it to come a few minutes early to fill this out. I also instructed him to bring in a list of his current medication. Pt with understanding.

## 2020-09-02 ENCOUNTER — Observation Stay
Admission: EM | Admit: 2020-09-02 | Discharge: 2020-09-03 | Disposition: A | Discharge status: Home / Self Care | Payer: Medicare HMO | Attending: Internal Medicine | Admitting: Internal Medicine

## 2020-09-02 ENCOUNTER — Emergency Department: Payer: Medicare HMO

## 2020-09-02 ENCOUNTER — Other Ambulatory Visit: Payer: Self-pay

## 2020-09-02 ENCOUNTER — Ambulatory Visit
Payer: Medicare HMO | Attending: Student in an Organized Health Care Education/Training Program | Admitting: Student in an Organized Health Care Education/Training Program

## 2020-09-02 DIAGNOSIS — F17210 Nicotine dependence, cigarettes, uncomplicated: Secondary | ICD-10-CM | POA: Insufficient documentation

## 2020-09-02 DIAGNOSIS — E119 Type 2 diabetes mellitus without complications: Secondary | ICD-10-CM

## 2020-09-02 DIAGNOSIS — Z20822 Contact with and (suspected) exposure to covid-19: Secondary | ICD-10-CM | POA: Insufficient documentation

## 2020-09-02 DIAGNOSIS — N401 Enlarged prostate with lower urinary tract symptoms: Secondary | ICD-10-CM | POA: Diagnosis present

## 2020-09-02 DIAGNOSIS — Z7984 Long term (current) use of oral hypoglycemic drugs: Secondary | ICD-10-CM | POA: Diagnosis not present

## 2020-09-02 DIAGNOSIS — J441 Chronic obstructive pulmonary disease with (acute) exacerbation: Secondary | ICD-10-CM | POA: Diagnosis not present

## 2020-09-02 DIAGNOSIS — R0602 Shortness of breath: Secondary | ICD-10-CM | POA: Diagnosis present

## 2020-09-02 DIAGNOSIS — J9621 Acute and chronic respiratory failure with hypoxia: Secondary | ICD-10-CM | POA: Diagnosis not present

## 2020-09-02 DIAGNOSIS — C61 Malignant neoplasm of prostate: Secondary | ICD-10-CM | POA: Diagnosis present

## 2020-09-02 LAB — CBC WITH DIFFERENTIAL/PLATELET
Abs Immature Granulocytes: 0.02 10*3/uL (ref 0.00–0.07)
Basophils Absolute: 0.1 10*3/uL (ref 0.0–0.1)
Basophils Relative: 1 %
Eosinophils Absolute: 0.2 10*3/uL (ref 0.0–0.5)
Eosinophils Relative: 2 %
HCT: 47.1 % (ref 39.0–52.0)
Hemoglobin: 15.2 g/dL (ref 13.0–17.0)
Immature Granulocytes: 0 %
Lymphocytes Relative: 36 %
Lymphs Abs: 3.3 10*3/uL (ref 0.7–4.0)
MCH: 27.1 pg (ref 26.0–34.0)
MCHC: 32.3 g/dL (ref 30.0–36.0)
MCV: 84 fL (ref 80.0–100.0)
Monocytes Absolute: 0.7 10*3/uL (ref 0.1–1.0)
Monocytes Relative: 8 %
Neutro Abs: 4.7 10*3/uL (ref 1.7–7.7)
Neutrophils Relative %: 53 %
Platelets: 357 10*3/uL (ref 150–400)
RBC: 5.61 MIL/uL (ref 4.22–5.81)
RDW: 16.3 % — ABNORMAL HIGH (ref 11.5–15.5)
WBC: 9 10*3/uL (ref 4.0–10.5)
nRBC: 0 % (ref 0.0–0.2)

## 2020-09-02 LAB — COMPREHENSIVE METABOLIC PANEL
ALT: 18 U/L (ref 0–44)
AST: 19 U/L (ref 15–41)
Albumin: 3.8 g/dL (ref 3.5–5.0)
Alkaline Phosphatase: 63 U/L (ref 38–126)
Anion gap: 13 (ref 5–15)
BUN: 15 mg/dL (ref 8–23)
CO2: 18 mmol/L — ABNORMAL LOW (ref 22–32)
Calcium: 9 mg/dL (ref 8.9–10.3)
Chloride: 107 mmol/L (ref 98–111)
Creatinine, Ser: 0.9 mg/dL (ref 0.61–1.24)
GFR calc Af Amer: >60 mL/min (ref >60)
GFR calc non Af Amer: >60 mL/min (ref >60)
Glucose, Bld: 93 mg/dL (ref 70–99)
Potassium: 3.7 mmol/L (ref 3.5–5.1)
Sodium: 138 mmol/L (ref 135–145)
Total Bilirubin: 0.5 mg/dL (ref 0.3–1.2)
Total Protein: 6.7 g/dL (ref 6.5–8.1)

## 2020-09-02 LAB — BLOOD GAS, VENOUS
Acid-base deficit: 3.7 mmol/L — ABNORMAL HIGH (ref 0.0–2.0)
Bicarbonate: 21.5 mmol/L (ref 20.0–28.0)
O2 Saturation: 84.4 %
Patient temperature: 37
pCO2, Ven: 39 mmHg — ABNORMAL LOW (ref 44.0–60.0)
pH, Ven: 7.35 (ref 7.250–7.430)
pO2, Ven: 52 mmHg — ABNORMAL HIGH (ref 32.0–45.0)

## 2020-09-02 LAB — TROPONIN I (HIGH SENSITIVITY): Troponin I (High Sensitivity): 4 ng/L (ref <18)

## 2020-09-02 LAB — SARS CORONAVIRUS 2 BY RT PCR (HOSPITAL ORDER, PERFORMED IN ~~LOC~~ HOSPITAL LAB): SARS Coronavirus 2: NEGATIVE

## 2020-09-02 LAB — GLUCOSE, CAPILLARY: Glucose-Capillary: 154 mg/dL — ABNORMAL HIGH (ref 70–99)

## 2020-09-02 MED ORDER — LORAZEPAM 0.5 MG PO TABS: 0.5000 mg | ORAL_TABLET | Freq: Four times a day (QID) | ORAL | Status: DC | PRN

## 2020-09-02 MED ORDER — MORPHINE SULFATE (PF) 4 MG/ML IV SOLN
4.0000 mg | INTRAVENOUS | Status: DC | PRN
  Administered 2020-09-03: 4 mg via INTRAVENOUS
  Filled 2020-09-02: qty 1

## 2020-09-02 MED ORDER — HYDROCODONE-ACETAMINOPHEN 5-325 MG PO TABS: 1.0000 | ORAL_TABLET | ORAL | Status: DC | PRN

## 2020-09-02 MED ORDER — ENOXAPARIN SODIUM 40 MG/0.4ML ~~LOC~~ SOLN
40.0000 mg | SUBCUTANEOUS | Status: DC
  Administered 2020-09-02: 40 mg via SUBCUTANEOUS
  Filled 2020-09-02: qty 0.4

## 2020-09-02 MED ORDER — POLYETHYLENE GLYCOL 3350 17 G PO PACK: 17.0000 g | PACK | Freq: Every day | ORAL | Status: DC | PRN

## 2020-09-02 MED ORDER — METHYLPREDNISOLONE SODIUM SUCC 125 MG IJ SOLR
60.0000 mg | Freq: Three times a day (TID) | INTRAMUSCULAR | Status: DC
  Administered 2020-09-02 – 2020-09-03 (×2): 60 mg via INTRAVENOUS
  Filled 2020-09-02 (×3): qty 2

## 2020-09-02 MED ORDER — INSULIN ASPART 100 UNIT/ML ~~LOC~~ SOLN
0.0000 [IU] | Freq: Three times a day (TID) | SUBCUTANEOUS | Status: DC
  Administered 2020-09-03: 2 [IU] via SUBCUTANEOUS
  Administered 2020-09-03: 1 [IU] via SUBCUTANEOUS
  Filled 2020-09-02 (×2): qty 1

## 2020-09-02 MED ORDER — MELOXICAM 7.5 MG PO TABS
15.0000 mg | ORAL_TABLET | Freq: Every day | ORAL | Status: DC
  Administered 2020-09-03: 15 mg via ORAL
  Filled 2020-09-02: qty 2

## 2020-09-02 MED ORDER — METHYLPREDNISOLONE SODIUM SUCC 125 MG IJ SOLR
125.0000 mg | Freq: Once | INTRAMUSCULAR | Status: AC
  Administered 2020-09-02: 125 mg via INTRAVENOUS

## 2020-09-02 MED ORDER — DM-GUAIFENESIN ER 30-600 MG PO TB12
1.0000 | ORAL_TABLET | Freq: Two times a day (BID) | ORAL | Status: DC
  Administered 2020-09-03: 1 via ORAL
  Filled 2020-09-02 (×2): qty 1

## 2020-09-02 MED ORDER — ONDANSETRON HCL 4 MG PO TABS: 4.0000 mg | ORAL_TABLET | Freq: Four times a day (QID) | ORAL | Status: DC | PRN

## 2020-09-02 MED ORDER — ALBUTEROL SULFATE HFA 108 (90 BASE) MCG/ACT IN AERS
2.0000 | INHALATION_SPRAY | RESPIRATORY_TRACT | Status: DC | PRN
  Filled 2020-09-02: qty 6.7

## 2020-09-02 MED ORDER — ONDANSETRON HCL 4 MG/2ML IJ SOLN
INTRAMUSCULAR | Status: AC
  Administered 2020-09-02: 4 mg via INTRAVENOUS
  Filled 2020-09-02: qty 2

## 2020-09-02 MED ORDER — SODIUM CHLORIDE 0.9 % IV SOLN
500.0000 mg | INTRAVENOUS | Status: AC
  Administered 2020-09-02: 500 mg via INTRAVENOUS
  Filled 2020-09-02: qty 500

## 2020-09-02 MED ORDER — MORPHINE SULFATE (PF) 4 MG/ML IV SOLN
INTRAVENOUS | Status: AC
  Administered 2020-09-02: 4 mg via INTRAVENOUS
  Filled 2020-09-02: qty 1

## 2020-09-02 MED ORDER — IPRATROPIUM-ALBUTEROL 0.5-2.5 (3) MG/3ML IN SOLN
3.0000 mL | Freq: Once | RESPIRATORY_TRACT | Status: AC
  Administered 2020-09-02: 3 mL via RESPIRATORY_TRACT

## 2020-09-02 MED ORDER — ARFORMOTEROL TARTRATE 15 MCG/2ML IN NEBU
15.0000 ug | INHALATION_SOLUTION | Freq: Two times a day (BID) | RESPIRATORY_TRACT | Status: DC
  Filled 2020-09-02 (×3): qty 2

## 2020-09-02 MED ORDER — ACETAMINOPHEN 325 MG PO TABS: 650.0000 mg | ORAL_TABLET | Freq: Four times a day (QID) | ORAL | Status: DC | PRN

## 2020-09-02 MED ORDER — DIPHENHYDRAMINE HCL 50 MG/ML IJ SOLN
INTRAMUSCULAR | Status: AC
  Administered 2020-09-02: 25 mg via INTRAVENOUS
  Filled 2020-09-02: qty 1

## 2020-09-02 MED ORDER — DIPHENHYDRAMINE HCL 50 MG/ML IJ SOLN: 25.0000 mg | Freq: Once | INTRAMUSCULAR | Status: AC

## 2020-09-02 MED ORDER — ACETAMINOPHEN 650 MG RE SUPP: 650.0000 mg | Freq: Four times a day (QID) | RECTAL | Status: DC | PRN

## 2020-09-02 MED ORDER — ONDANSETRON HCL 4 MG/2ML IJ SOLN: 4.0000 mg | Freq: Once | INTRAMUSCULAR | Status: AC

## 2020-09-02 MED ORDER — BUDESONIDE 0.25 MG/2ML IN SUSP
0.2500 mg | Freq: Two times a day (BID) | RESPIRATORY_TRACT | Status: DC
  Administered 2020-09-02 – 2020-09-03 (×2): 0.25 mg via RESPIRATORY_TRACT
  Filled 2020-09-02 (×2): qty 2

## 2020-09-02 MED ORDER — IPRATROPIUM-ALBUTEROL 0.5-2.5 (3) MG/3ML IN SOLN
3.0000 mL | Freq: Four times a day (QID) | RESPIRATORY_TRACT | Status: DC
  Administered 2020-09-02 – 2020-09-03 (×3): 3 mL via RESPIRATORY_TRACT
  Filled 2020-09-02 (×2): qty 3

## 2020-09-02 MED ORDER — AZITHROMYCIN 500 MG PO TABS: 500.0000 mg | ORAL_TABLET | Freq: Every day | ORAL | Status: DC

## 2020-09-02 MED ORDER — BISACODYL 5 MG PO TBEC: 5.0000 mg | DELAYED_RELEASE_TABLET | Freq: Every day | ORAL | Status: DC | PRN

## 2020-09-02 MED ORDER — ONDANSETRON HCL 4 MG/2ML IJ SOLN: 4.0000 mg | Freq: Four times a day (QID) | INTRAMUSCULAR | Status: DC | PRN

## 2020-09-02 NOTE — ED Provider Notes (Signed)
[facility] Emergency Department Provider Note    First MD Initiated Contact with Patient 09/02/20 1304     (approximate)  I have reviewed the triage vital signs and the nursing notes.   HISTORY  Chief Complaint Respiratory Distress    HPI Ernest [name] is a 64 y.o. [name] with the below listed past medical history is brought to the ER after I was called to respond to a CODE BLUE in the medical arts building.  Patient  was being seen in pain clinic for new patient visit.  He wears O2 at home but while in the waiting room started having worsening shortness of breath diaphoresis and started to decompensate.  CODE BLUE was called.  On my arrival patient severely tachypneic diaphoretic and critically ill-appearing.  He was protecting his airway.  Was brought emergently over to the ER.  Patient satting 100% on nonrebreather placed on BiPAP for significant tachypnea.  Does have diminished breath sounds with coarse wheezing auscultated.  No reported fevers.   Past Medical History:  Diagnosis Date  . Acute on chronic respiratory failure with hypoxia ([place]) 05/25/2017  . Acute respiratory failure with hypoxia ([place]) 05/25/2017  . COPD (chronic obstructive pulmonary disease) ([place])   . COPD with acute bronchitis ([place]) 05/25/2017  . COPD with acute exacerbation ([place]) 05/25/2017  . Diabetes mellitus without complication ([place])   . Leukocytosis 05/25/2017  . Prostate cancer [facility])    Family History  Problem Relation Age of Onset  . Stroke Mother   . Breast cancer Mother   . Bladder Cancer Neg Hx   . Kidney cancer Neg Hx   . Prostate cancer Neg Hx    Past Surgical History:  Procedure Laterality Date  . TESTICLE SURGERY     undecended testicle repair   Patient Active Problem List   Diagnosis Date Noted  . COPD exacerbation ([place]) 04/29/2020  . Prostate cancer ([place]) 12/27/2017  . Elevated PSA 11/01/2017  . Benign prostatic hyperplasia with lower urinary tract symptoms  11/01/2017  . Acute on chronic respiratory failure with hypoxia ([place]) 05/25/2017  . COPD with acute exacerbation ([place]) 05/25/2017  . COPD with acute bronchitis ([place]) 05/25/2017  . Leukocytosis 05/25/2017      Prior to Admission medications   Medication Sig Start Date End Date Taking? Authorizing Provider  albuterol (VENTOLIN HFA) 108 (90 Base) MCG/ACT inhaler Inhale 2 puffs into the lungs every 4 (four) hours as needed for wheezing or shortness of breath. 05/02/20   Nicole Kindred A, DO  arformoterol (BROVANA) 15 MCG/2ML NEBU Take 15 mcg by nebulization 2 (two) times daily. 08/22/18   [provider]  azithromycin (ZITHROMAX) 250 MG tablet for COPD Exacerbation. TAKE 1 TABLET BY MOUTH ONCE DAILY TO  PREVENT  COPD  EXACERBATIONS. 02/12/20   [provider]  benzonatate (TESSALON) 100 MG capsule Take 1 capsule (100 mg total) by mouth 3 (three) times daily as needed for cough. 05/02/20   Nicole Kindred A, DO  budesonide (PULMICORT) 0.5 MG/2ML nebulizer solution Take 0.5 mg by nebulization 2 (two) times daily. 10/17/19   [provider]  budesonide-formoterol (SYMBICORT) 160-4.5 MCG/ACT inhaler Inhale 2 puffs into the lungs 2 (two) times daily. 05/29/17   Dustin Flock, MD  chlorpheniramine-HYDROcodone (TUSSIONEX) 10-8 MG/5ML SUER Take 5 mLs by mouth every 12 (twelve) hours as needed for cough. 05/02/20   Ezekiel Slocumb, DO  finasteride (PROSCAR) 5 MG tablet Take 5 mg by mouth daily. 04/17/20   [provider]  gabapentin (NEURONTIN) 100 MG capsule Take by mouth. 08/22/18   [provider]  HYDROcodone-acetaminophen (NORCO/VICODIN) 5-325 MG tablet Take 1 tablet by mouth every 6 (six) hours as needed. 10/12/17   [provider]  ipratropium (ATROVENT) 0.02 % nebulizer solution Inhale into the lungs. 08/22/18   [provider]  ipratropium-albuterol (DUONEB) 0.5-2.5 (3) MG/3ML SOLN Take 3 mLs by nebulization every 4 (four) hours as needed (SOB,  wheezing). Patient taking differently: Take 3 mLs by nebulization every 6 (six) hours as needed (SOB, wheezing).  07/31/18   Salary, Avel Peace, MD  loratadine (CLARITIN) 10 MG tablet Take 1 tablet (10 mg total) by mouth daily. 05/03/20   Ezekiel Slocumb, DO  LORazepam (ATIVAN) 0.5 MG tablet Take 1 every 12 hours as needed for anxiety 07/25/18   Milton Ferguson, MD  meloxicam (MOBIC) 15 MG tablet Take 15 mg by mouth daily. 04/17/20   [provider]  metFORMIN (GLUCOPHAGE) 500 MG tablet Take 500 mg by mouth every morning. 03/26/20   [provider]  nicotine (NICODERM CQ - DOSED IN MG/24 HOURS) 14 mg/24hr patch Place onto the skin. 08/22/18   [provider]  omeprazole (PRILOSEC) 40 MG capsule Take 40 mg by mouth daily. 01/15/20   [provider]  predniSONE (DELTASONE) 10 MG tablet Take by mouth. 05/28/20   [provider]  sertraline (ZOLOFT) 50 MG tablet Take 50 mg by mouth daily.    [provider]  sildenafil (REVATIO) 20 MG tablet Take 20 mg by mouth daily as needed. 03/15/20   [provider]  silodosin (RAPAFLO) 8 MG CAPS capsule Take 8 mg by mouth at bedtime. 04/22/20   [provider]    Allergies Morphine and related    Social History Social History   Tobacco Use  . Smoking status: Current Every Day Smoker    Packs/day: 0.50    Types: Cigarettes  . Smokeless tobacco: Never Used  Vaping Use  . Vaping Use: Never used  Substance Use Topics  . Alcohol use: Yes    Comment: occasionally   . Drug use: Never    Review of Systems Patient denies headaches, rhinorrhea, blurry vision, numbness, shortness of breath, chest pain, edema, cough, abdominal pain, nausea, vomiting, diarrhea, dysuria, fevers, rashes or hallucinations unless otherwise stated above in HPI. ____________________________________________   PHYSICAL EXAM:  VITAL SIGNS: Vitals:   09/02/20 1327 09/02/20 1449  BP:    Pulse: 88   Resp: (!) 22     Temp:  97.9 F (36.6 C)  SpO2: 100%     Constitutional: Alert and oriented. Critically ill appearing Eyes: Conjunctivae are normal.  Head: Atraumatic. Nose: No congestion/rhinnorhea. Mouth/Throat: Mucous membranes are moist.   Neck: No stridor. Painless ROM.  Cardiovascular: Normal rate, regular rhythm. Grossly normal heart sounds.  Good peripheral circulation. Respiratory: Tachypnea prolonged expiratory phase diminished breath sounds throughout use of accessory muscles  gastrointestinal: Soft and nontender. No distention. No abdominal bruits. No CVA tenderness. Genitourinary:  Musculoskeletal: No lower extremity tenderness nor edema.  No joint effusions. Neurologic:  Normal speech and language. No gross focal neurologic deficits are appreciated. No facial droop Skin:  Skin is cool and diaphoretic. No rash noted. Psychiatric: Mood and affect are normal. Speech and behavior are normal.  ____________________________________________   LABS (all labs ordered are listed, but only abnormal results are displayed)  Results for orders placed or performed during the hospital encounter of 09/02/20 (from the past 24 hour(s))  CBC with Differential/Platelet  Status: Abnormal   Collection Time: 09/02/20  1:05 PM  Result Value Ref Range   WBC 9.0 4.0 - 10.5 K/uL   RBC 5.61 4.22 - 5.81 MIL/uL   Hemoglobin 15.2 13.0 - 17.0 g/dL   HCT 47.1 39 - 52 %   MCV 84.0 80.0 - 100.0 fL   MCH 27.1 26.0 - 34.0 pg   MCHC 32.3 30.0 - 36.0 g/dL   RDW 16.3 (H) 11.5 - 15.5 %   Platelets 357 150 - 400 K/uL   nRBC 0.0 0.0 - 0.2 %   Neutrophils Relative % 53 %   Neutro Abs 4.7 1.7 - 7.7 K/uL   Lymphocytes Relative 36 %   Lymphs Abs 3.3 0.7 - 4.0 K/uL   Monocytes Relative 8 %   Monocytes Absolute 0.7 0 - 1 K/uL   Eosinophils Relative 2 %   Eosinophils Absolute 0.2 0 - 0 K/uL   Basophils Relative 1 %   Basophils Absolute 0.1 0 - 0 K/uL   Immature Granulocytes 0 %   Abs Immature Granulocytes 0.02 0.00  - 0.07 K/uL  Comprehensive metabolic panel     Status: Abnormal   Collection Time: 09/02/20  1:05 PM  Result Value Ref Range   Sodium 138 135 - 145 mmol/L   Potassium 3.7 3.5 - 5.1 mmol/L   Chloride 107 98 - 111 mmol/L   CO2 18 (L) 22 - 32 mmol/L   Glucose, Bld 93 70 - 99 mg/dL   BUN 15 8 - 23 mg/dL   Creatinine, Ser 0.90 0.61 - 1.24 mg/dL   Calcium 9.0 8.9 - 10.3 mg/dL   Total Protein 6.7 6.5 - 8.1 g/dL   Albumin 3.8 3.5 - 5.0 g/dL   AST 19 15 - 41 U/L   ALT 18 0 - 44 U/L   Alkaline Phosphatase 63 38 - 126 U/L   Total Bilirubin 0.5 0.3 - 1.2 mg/dL   GFR calc non Af Amer >60 >60 mL/min   GFR calc Af Amer >60 >60 mL/min   Anion gap 13 5 - 15  Troponin I (High Sensitivity)     Status: None   Collection Time: 09/02/20  1:05 PM  Result Value Ref Range   Troponin I (High Sensitivity) 4 <18 ng/L  SARS Coronavirus 2 by RT PCR (hospital order, performed in [place] hospital lab) Nasopharyngeal Nasopharyngeal Swab     Status: None   Collection Time: 09/02/20  1:28 PM   Specimen: Nasopharyngeal Swab  Result Value Ref Range   SARS Coronavirus 2 NEGATIVE NEGATIVE  Blood gas, venous     Status: Abnormal   Collection Time: 09/02/20  1:35 PM  Result Value Ref Range   pH, Ven 7.35 7.25 - 7.43   pCO2, Ven 39 (L) 44 - 60 mmHg   pO2, Ven 52.0 (H) 32 - 45 mmHg   Bicarbonate 21.5 20.0 - 28.0 mmol/L   Acid-base deficit 3.7 (H) 0.0 - 2.0 mmol/L   O2 Saturation 84.4 %   Patient temperature 37.0    Collection site VEIN    Sample type VENOUS    ____________________________________________  EKG My review and personal interpretation at Time: 13:14   Indication: sob  Rate: 95  Rhythm: sinus Axis: normal Other: normal intervals, no stemi ____________________________________________  RADIOLOGY  I personally reviewed all radiographic images ordered to evaluate for the above acute complaints and reviewed radiology reports and findings.  These findings were personally discussed with the  patient.  Please see medical  record for radiology report.  ____________________________________________   PROCEDURES  Procedure(s) performed:  .Critical Care Performed by: Merlyn Lot, MD Authorized by: Merlyn Lot, MD   Critical care provider statement:    Critical care time (minutes):  35   Critical care time was exclusive of:  Separately billable procedures and treating other patients   Critical care was necessary to treat or prevent imminent or life-threatening deterioration of the following conditions:  Respiratory failure   Critical care was time spent personally by me on the following activities:  Development of treatment plan with patient or surrogate, discussions with consultants, evaluation of patient's response to treatment, examination of patient, obtaining history from patient or surrogate, ordering and performing treatments and interventions, ordering and review of laboratory studies, ordering and review of radiographic studies, pulse oximetry, re-evaluation of patient's condition and review of old charts      Critical Care performed: yes ____________________________________________   INITIAL IMPRESSION / [place] / ED COURSE  Pertinent labs & imaging results that were available during my care of the patient were reviewed by me and considered in my medical decision making (see chart for details).   DDX: Asthma, copd, CHF, pna, ptx, malignancy, Pe, anemia   Ernest [name] is a 64 y.o. who presents to the ED with acute respiratory distress as described above.  Placed on BiPAP.  I suspect COPD exacerbation.  Will give IV Solu-Medrol and duo nebs and reassess.  The patient will be placed on continuous pulse oximetry and telemetry for monitoring.  Laboratory evaluation will be sent to evaluate for the above complaints.     Clinical Course as of Sep 03 1515  Wed Sep 02, 2020  1341 Patient reassessed.  Clinically improving.  Is complaining of severe  lower back pain which she says is chronic.  Is respirations are improving and he is satting well I will order some IV narcotic medication.   [PR]  8937 Tried to wean patient from BiPAP but still with significant increased work of breathing.     [PR]  1515 Patient appears comfortable on his BiPAP.  Will discuss with hospitalist for admission.   [PR]    Clinical Course User Index [PR] Merlyn Lot, MD    The patient was evaluated in Emergency Department today for the symptoms described in the history of present illness. He/she was evaluated in the context of the global COVID-19 pandemic, which necessitated consideration that the patient might be at risk for infection with the SARS-CoV-2 virus that causes COVID-19. Institutional protocols and algorithms that pertain to the evaluation of patients at risk for COVID-19 are in a state of rapid change based on information released by regulatory bodies including the CDC and federal and state organizations. These policies and algorithms were followed during the patient's care in the ED.  As part of my medical decision making, I reviewed the following data within the [place] notes reviewed and incorporated, Labs reviewed, notes from prior ED visits and [place] Controlled Substance Database   ____________________________________________   FINAL CLINICAL IMPRESSION(S) / ED DIAGNOSES  Final diagnoses:  COPD exacerbation ([place])      NEW MEDICATIONS STARTED DURING THIS VISIT:  New Prescriptions   No medications on file     Note:  This document was prepared using Dragon voice recognition software and may include unintentional dictation errors.    Merlyn Lot, MD 09/02/20 [phone]

## 2020-09-02 NOTE — ED Triage Notes (Signed)
Pt arrived from the medical offices in severe respiratory distress. Pt states he' been having distress for 2-3 days. Pt coughing up clear phlegm.

## 2020-09-02 NOTE — H&P (Addendum)
History and Physical    Ernest [name] SWH:[id number] DOB: 01-14-1956 DOA: 09/02/2020  PCP: Tracie Harrier, MD  Patient coming from: home  I have personally briefly reviewed patient's old medical records in [name]  Chief Complaint: difficulty breathing  HPI: [name] is a 64 y.o. male with medical history significant of COPD on 3 L/min oxygen at baseline, type 2 diabetes, prostate cancer, chronic low back with radiculopathy who presented to the ED after a rapid response was called in [place] building where patient was being seen in the pain clinic as a new patient.  He was in the waiting room when he began having shortness of breath became diaphoretic, quickly appeared to be decompensating so rapid was called.  When seen on admission, patient reports slightly worsening shortness of breath at home recently, but stable oxygen requirement at 3 L/min.  No fevers or chills, no worsening of cough.  He denies other recent illnesses or sick contacts.  Specifically denies nausea vomiting or diarrhea, dysuria or urinary frequency, headache, or sore throat.  Patient was taken emergently to the ED and placed on BiPAP.  ED Course: Afebrile, tachypneic RR 22-25, 100% spO2 on BiPAP.  CMP and CBC were unremarkable.  Covid-19 PCR negative.  Chest xray negative for acute process, showed low volume exam with basilar atelectasis versus scarring.  Treated with bronchodilators and high dose IV steroids in the ED.  Admitted to hospitalist service for further management.   Review of Systems: As per HPI otherwise 10 point review of systems negative.    Past Medical History:  Diagnosis Date   Acute on chronic respiratory failure with hypoxia ([place]) 05/25/2017   Acute respiratory failure with hypoxia ([place]) 05/25/2017   COPD (chronic obstructive pulmonary disease) (HCC)    COPD with acute bronchitis ([place]) 05/25/2017   COPD with acute exacerbation ([place]) 05/25/2017   Diabetes mellitus without  complication (HCC)    Leukocytosis 05/25/2017   Prostate cancer [facility])     Past Surgical History:  Procedure Laterality Date   TESTICLE SURGERY     undecended testicle repair     reports that he has been smoking cigarettes. He has been smoking about 0.50 packs per day. He has never used smokeless tobacco. He reports current alcohol use. He reports that he does not use drugs.  Allergies  Allergen Reactions   Morphine And Related Itching    Family History  Problem Relation Age of Onset   Stroke Mother    Breast cancer Mother    Bladder Cancer Neg Hx    Kidney cancer Neg Hx    Prostate cancer Neg Hx      Prior to Admission medications   Medication Sig Start Date End Date Taking? Authorizing Provider  albuterol (VENTOLIN HFA) 108 (90 Base) MCG/ACT inhaler Inhale 2 puffs into the lungs every 4 (four) hours as needed for wheezing or shortness of breath. 05/02/20   Nicole Kindred A, DO  arformoterol (BROVANA) 15 MCG/2ML NEBU Take 15 mcg by nebulization 2 (two) times daily. 08/22/18   [provider]  azithromycin (ZITHROMAX) 250 MG tablet for COPD Exacerbation. TAKE 1 TABLET BY MOUTH ONCE DAILY TO  PREVENT  COPD  EXACERBATIONS. 02/12/20   [provider]  benzonatate (TESSALON) 100 MG capsule Take 1 capsule (100 mg total) by mouth 3 (three) times daily as needed for cough. 05/02/20   Nicole Kindred A, DO  budesonide (PULMICORT) 0.5 MG/2ML nebulizer solution Take 0.5 mg by nebulization 2 (two) times daily.  10/17/19   [provider]  budesonide-formoterol (SYMBICORT) 160-4.5 MCG/ACT inhaler Inhale 2 puffs into the lungs 2 (two) times daily. 05/29/17   Dustin Flock, MD  chlorpheniramine-HYDROcodone (TUSSIONEX) 10-8 MG/5ML SUER Take 5 mLs by mouth every 12 (twelve) hours as needed for cough. 05/02/20   Ezekiel Slocumb, DO  finasteride (PROSCAR) 5 MG tablet Take 5 mg by mouth daily. 04/17/20   [provider]  gabapentin (NEURONTIN) 100 MG capsule  Take by mouth. 08/22/18   [provider]  HYDROcodone-acetaminophen (NORCO/VICODIN) 5-325 MG tablet Take 1 tablet by mouth every 6 (six) hours as needed. 10/12/17   [provider]  ipratropium (ATROVENT) 0.02 % nebulizer solution Inhale into the lungs. 08/22/18   [provider]  ipratropium-albuterol (DUONEB) 0.5-2.5 (3) MG/3ML SOLN Take 3 mLs by nebulization every 4 (four) hours as needed (SOB, wheezing). Patient taking differently: Take 3 mLs by nebulization every 6 (six) hours as needed (SOB, wheezing).  07/31/18   Salary, Avel Peace, MD  loratadine (CLARITIN) 10 MG tablet Take 1 tablet (10 mg total) by mouth daily. 05/03/20   Ezekiel Slocumb, DO  LORazepam (ATIVAN) 0.5 MG tablet Take 1 every 12 hours as needed for anxiety 07/25/18   Milton Ferguson, MD  meloxicam (MOBIC) 15 MG tablet Take 15 mg by mouth daily. 04/17/20   [provider]  metFORMIN (GLUCOPHAGE) 500 MG tablet Take 500 mg by mouth every morning. 03/26/20   [provider]  nicotine (NICODERM CQ - DOSED IN MG/24 HOURS) 14 mg/24hr patch Place onto the skin. 08/22/18   [provider]  omeprazole (PRILOSEC) 40 MG capsule Take 40 mg by mouth daily. 01/15/20   [provider]  predniSONE (DELTASONE) 10 MG tablet Take by mouth. 05/28/20   [provider]  sertraline (ZOLOFT) 50 MG tablet Take 50 mg by mouth daily.    [provider]  sildenafil (REVATIO) 20 MG tablet Take 20 mg by mouth daily as needed. 03/15/20   [provider]  silodosin (RAPAFLO) 8 MG CAPS capsule Take 8 mg by mouth at bedtime. 04/22/20   [provider]    Physical Exam: Vitals:   09/02/20 1319 09/02/20 1327 09/02/20 1449 09/02/20 1450  BP:      Pulse:  88  60  Resp:  (!) 22    Temp:   97.9 F (36.6 C)   SpO2:  100%  99%  Weight: 108.9 kg     Height: 6\' 3"  (1.905 m)        Constitutional: NAD, calm, appears uncomfortable (fidgeting with back pain) ENMT: On BiPAP.  Hearing grossly normal. Neck: normal, supple, no masses, no thyromegaly Respiratory: Very poor air movement, unable to hear wheezing, no rhonchi, conversational dyspnea.  Cardiovascular: RRR, normal S1/S2, no peripheral edema.  Abdomen: soft, NT, ND, +bowel sounds Musculoskeletal: no clubbing / cyanosis. No joint deformity upper and lower extremities. Normal muscle tone.  Skin: dry, intact, normal color, normal temperature Neurologic: CN 2-12 grossly intact. Normal speech.  Grossly non-focal exam. Psychiatric: Alert and oriented x 3. Normal mood. Congruent affect.  Normal judgement and insight.    Labs on Admission: I have personally reviewed following labs and imaging studies  CBC: Recent Labs  Lab 09/02/20 1305  WBC 9.0  NEUTROABS 4.7  HGB 15.2  HCT 47.1  MCV 84.0  PLT 161   Basic Metabolic Panel: Recent Labs  Lab 09/02/20 1305  NA 138  K 3.7  CL 107  CO2 18*  GLUCOSE 93  BUN 15  CREATININE 0.90  CALCIUM 9.0   GFR: Estimated Creatinine Clearance: 112.1 mL/min (by C-G formula based on SCr of 0.9 mg/dL). Liver Function Tests: Recent Labs  Lab 09/02/20 1305  AST 19  ALT 18  ALKPHOS 63  BILITOT 0.5  PROT 6.7  ALBUMIN 3.8   No results for input(s): LIPASE, AMYLASE in the last 168 hours. No results for input(s): AMMONIA in the last 168 hours. Coagulation Profile: No results for input(s): INR, PROTIME in the last 168 hours. Cardiac Enzymes: No results for input(s): CKTOTAL, CKMB, CKMBINDEX, TROPONINI in the last 168 hours. BNP (last 3 results) No results for input(s): PROBNP in the last 8760 hours. HbA1C: No results for input(s): HGBA1C in the last 72 hours. CBG: No results for input(s): GLUCAP in the last 168 hours. Lipid Profile: No results for input(s): CHOL, HDL, LDLCALC, TRIG, CHOLHDL, LDLDIRECT in the last 72 hours. Thyroid Function Tests: No results for input(s): TSH, T4TOTAL, FREET4, T3FREE, THYROIDAB in the last 72 hours. Anemia Panel: No  results for input(s): VITAMINB12, FOLATE, FERRITIN, TIBC, IRON, RETICCTPCT in the last 72 hours. Urine analysis:    Component Value Date/Time   COLORURINE YELLOW (A) 07/29/2018 1917   APPEARANCEUR CLEAR (A) 07/29/2018 1917   APPEARANCEUR Clear 10/04/2017 1106   LABSPEC 1.013 07/29/2018 1917   PHURINE 7.0 07/29/2018 1917   GLUCOSEU NEGATIVE 07/29/2018 [place] NEGATIVE 07/29/2018 [place] NEGATIVE 07/29/2018 1917   BILIRUBINUR Negative 10/04/2017 1106   [place] 07/29/2018 1917   PROTEINUR NEGATIVE 07/29/2018 1917   NITRITE NEGATIVE 07/29/2018 1917   LEUKOCYTESUR NEGATIVE 07/29/2018 1917   LEUKOCYTESUR Negative 10/04/2017 1106    Radiological Exams on Admission: DG Chest Portable 1 View  Result Date: 09/02/2020 CLINICAL DATA:  Shortness of breath, respiratory distress EXAM: PORTABLE CHEST 1 VIEW COMPARISON:  05/02/2020 FINDINGS: Minimal basilar atelectasis versus scarring. Slightly decreased lung volumes. Normal heart size and vascularity. No significant focal pneumonia, collapse or consolidation. Negative for edema, effusion or pneumothorax. Trachea midline. IMPRESSION: Low volume exam with basilar atelectasis versus scarring. No other acute chest process. Electronically Signed   By: Jerilynn Mages.  Shick M.D.   On: 09/02/2020 13:30    EKG: Independently reviewed.   Assessment/Plan Principal Problem:   COPD with acute exacerbation (HCC) Active Problems:   Acute on chronic respiratory failure with hypoxia (HCC)   Type 2 diabetes mellitus (HCC)   Benign prostatic hyperplasia with lower urinary tract symptoms   Prostate cancer ([place])    COPD with acute exacerbation - present on admission. Patient in respiratory distress requiring BiPAP on admission.   --IV Solu-medrol 60 mg IV q8h --Z-pack --Continue Pulmicort and Brovana (hold Trelegy) --Scheduled Duonebs, PRN albuterol inhaler --Mucinex --Incentive spirometer  Acute on chronic respiratory failure with hypoxia -  due to COPD.  Baseline requirement in 3 L/min.  Presented in significant respiratory distress, requiring BiPAP.  --BiPAP PRN --Supplemental o2, maintain sats >88%   Type 2 diabetes mellitus - hold metformin.  Sliding scale Novolog.   Chronic back pain with lumbar radiculopathy - was at pain clinic, new patient visit today, was not able to be seen prior to respiratory issues.  Recommend very close follow up after discharge. --Norco PRN   GERD - continue PPI  Depression/Anxiety - continue Zoloft, PRN Ativan  BPH - continue home meds pending med history  Prostate cancer - no acute issues    DVT prophylaxis: enoxaparin (LOVENOX) injection 40 mg Start: 09/02/20 2200  Code Status: full Family Communication: attempted unsuccesfully to reach patient's brother, Christia Reading, by phone Disposition Plan: d/c home pending improvement  Consults called: None   Admission status:  Status is: Observation  The patient remains OBS appropriate and will d/c before 2 midnights.  Dispo: The patient is from: Home              Anticipated d/c is to: Home              Anticipated d/c date is: 1-2 days              Patient currently is not medically stable to d/c.      Ezekiel Slocumb, DO [facility]  09/02/2020, 3:57 PM    If 7PM-7AM, please contact night-coverage. How to contact the [facility] Attending or Consulting provider [place] or covering provider during after hours [place], for this patient?    1. Check the care team in [facility] and look for a) attending/consulting [facility] provider listed and b) the [facility] team listed 2. Log into [email or web address] and use [place]'s universal password to access. If you do not have the password, please contact the hospital operator. 3. Locate the [facility] provider you are looking for under [facility] and page to a number that you can be directly reached. 4. If you still have difficulty reaching the provider, please page the [facility] (Director on Call) for the  Hospitalists listed on amion for assistance.

## 2020-09-02 NOTE — ED Notes (Signed)
Per Respiratory ok to take Bipap off and try on [place] at 2 lpm.

## 2020-09-02 NOTE — ED Notes (Addendum)
Pt tolerating [place], eating dinner

## 2020-09-02 NOTE — ED Notes (Signed)
Phlebitis noted after this RN gave morphine through IV. Verbal order for 25mg  benadryl IV per MD Kinner and ice applied to area. Pt calm only c/o itchiness. Pt respirations even and unlabored at this time.

## 2020-09-03 DIAGNOSIS — J441 Chronic obstructive pulmonary disease with (acute) exacerbation: Secondary | ICD-10-CM | POA: Diagnosis not present

## 2020-09-03 DIAGNOSIS — J9621 Acute and chronic respiratory failure with hypoxia: Secondary | ICD-10-CM | POA: Diagnosis not present

## 2020-09-03 LAB — CBC
HCT: 44.4 % (ref 39.0–52.0)
Hemoglobin: 14.8 g/dL (ref 13.0–17.0)
MCH: 27.2 pg (ref 26.0–34.0)
MCHC: 33.3 g/dL (ref 30.0–36.0)
MCV: 81.6 fL (ref 80.0–100.0)
Platelets: 382 10*3/uL (ref 150–400)
RBC: 5.44 MIL/uL (ref 4.22–5.81)
RDW: 16.6 % — ABNORMAL HIGH (ref 11.5–15.5)
WBC: 14.2 10*3/uL — ABNORMAL HIGH (ref 4.0–10.5)
nRBC: 0 % (ref 0.0–0.2)

## 2020-09-03 LAB — BASIC METABOLIC PANEL
Anion gap: 10 (ref 5–15)
BUN: 21 mg/dL (ref 8–23)
CO2: 22 mmol/L (ref 22–32)
Calcium: 9.2 mg/dL (ref 8.9–10.3)
Chloride: 106 mmol/L (ref 98–111)
Creatinine, Ser: 0.94 mg/dL (ref 0.61–1.24)
GFR calc Af Amer: >60 mL/min (ref >60)
GFR calc non Af Amer: >60 mL/min (ref >60)
Glucose, Bld: 180 mg/dL — ABNORMAL HIGH (ref 70–99)
Potassium: 4.3 mmol/L (ref 3.5–5.1)
Sodium: 138 mmol/L (ref 135–145)

## 2020-09-03 LAB — TROPONIN I (HIGH SENSITIVITY): Troponin I (High Sensitivity): 4 ng/L (ref <18)

## 2020-09-03 LAB — HIV ANTIBODY (ROUTINE TESTING W REFLEX): HIV Screen 4th Generation wRfx: NONREACTIVE

## 2020-09-03 LAB — GLUCOSE, CAPILLARY
Glucose-Capillary: 135 mg/dL — ABNORMAL HIGH (ref 70–99)
Glucose-Capillary: 168 mg/dL — ABNORMAL HIGH (ref 70–99)

## 2020-09-03 LAB — HEMOGLOBIN A1C
Hgb A1c MFr Bld: 6.4 % — ABNORMAL HIGH (ref 4.8–5.6)
Mean Plasma Glucose: 136.98 mg/dL

## 2020-09-03 MED ORDER — PREDNISONE 10 MG PO TABS: ORAL_TABLET | ORAL | 0 refills | Status: AC

## 2020-09-03 MED ORDER — PANTOPRAZOLE SODIUM 40 MG PO TBEC
40.0000 mg | DELAYED_RELEASE_TABLET | Freq: Every day | ORAL | Status: DC
  Administered 2020-09-03 (×2): 40 mg via ORAL
  Filled 2020-09-03: qty 1

## 2020-09-03 MED ORDER — DM-GUAIFENESIN ER 30-600 MG PO TB12: 1.0000 | ORAL_TABLET | Freq: Two times a day (BID) | ORAL | 0 refills | Status: DC

## 2020-09-03 MED ORDER — AZITHROMYCIN 500 MG PO TABS
500.0000 mg | ORAL_TABLET | Freq: Every day | ORAL | 0 refills | Status: DC
Start: 2020-09-03 — End: 2020-10-06

## 2020-09-03 MED ORDER — HYDROCODONE-ACETAMINOPHEN 5-325 MG PO TABS
1.0000 | ORAL_TABLET | ORAL | 0 refills | Status: DC | PRN
Start: 2020-09-03 — End: 2020-12-22

## 2020-09-03 NOTE — ED Notes (Signed)
Pt had reaction to morphine given IV, pt requested ice pack at this time, small amount of hives noted to left forearm

## 2020-09-03 NOTE — Progress Notes (Signed)
Pt ready for discharge home today per MD. Patient assessment unchanged from this morning. Reviewed discharge instructions and prescriptions with pt and his wife; all questions answered and pt verbalized understanding. PIV removed, VSS. Pt assisted to car via NT.   Ernest [name]

## 2020-09-03 NOTE — Hospital Course (Signed)
Ernest [name] is a 64 y.o. male with medical history significant of COPD on 3 L/min oxygen at baseline, type 2 diabetes, prostate cancer, chronic low back with radiculopathy who presented to the ED after a rapid response was called in [place] building where patient was being seen in the pain clinic as a new patient.  He was in the waiting room when he began having shortness of breath became diaphoretic, quickly appeared to be decompensating so rapid was called.  When seen on admission, patient reports slightly worsening shortness of breath at home recently, but stable oxygen requirement at 3 L/min.  Patient was taken emergently to the ED and placed on BiPAP.  Treated with high dose IV steroids and bronchodilators but was unable to tolerate coming off BiPAP several hours later. Admitted to hospitalist service for further management of acute exacerbation of COPD.

## 2020-09-03 NOTE — Discharge Summary (Signed)
Physician Discharge Summary  Ernest [name] ZYY:[id number] DOB: [birth year] DOA: 09/02/2020  PCP: Tracie Harrier, MD  Admit date: 09/02/2020 Discharge date: 09/03/2020  Admitted From: home Disposition:  home  Recommendations for Outpatient Follow-up:  1. Follow up with PCP in 1-2 weeks 2. Please obtain BMP/CBC in one week   Home Health: No  Equipment/Devices: None   Discharge Condition: Stable  CODE STATUS: Full  Diet recommendation: Carb Modified     Discharge Diagnoses: Principal Problem:   COPD with acute exacerbation ([place]) Active Problems:   Acute on chronic respiratory failure with hypoxia (HCC)   Type 2 diabetes mellitus (HCC)   Benign prostatic hyperplasia with lower urinary tract symptoms   Prostate cancer ([place])    Summary of HPI and Hospital Course:  Ernest [name] is a 64 y.o. male with medical history significant of COPD on 3 L/min oxygen at baseline, type 2 diabetes, prostate cancer, chronic low back with radiculopathy who presented to the ED after a rapid response was called in [facility] building where patient was being seen in the pain clinic as a new patient.  He was in the waiting room when he began having shortness of breath became diaphoretic, quickly appeared to be decompensating so rapid was called.  When seen on admission, patient reports slightly worsening shortness of breath at home recently, but stable oxygen requirement at 3 L/min.  Patient was taken emergently to the ED and placed on BiPAP.  Treated with high dose IV steroids and bronchodilators but was unable to tolerate coming off BiPAP several hours later. Admitted to hospitalist service for further management of acute exacerbation of COPD.     COPD with acute exacerbation - present on admission. Patient in respiratory distress requiring BiPAP on admission.   Treat with IV Solu-medrol 60 mg IV q8h, Zithromax, Schedule Duonebs and PRN Albuterol, Mucinex. Pulmicort and Brovana were substituted for home  Trelegy. Z-pack - 4 more days to finish after discharge Discharged with prednisone taper over 4 days.   Acute on chronic respiratory failure with hypoxia - due to COPD.  Baseline requirement in 3 L/min.  Presented in significant respiratory distress, required BiPAP initially due to severe respiratory distress.   Was able to be weaned to his baseline and remaining with stable O2 sats.   Chronic back pain with lumbar radiculopathy - was at pain clinic day of admission, was not able to be seen prior to getting in to respiratory distress and rapid response being called.  Recommend very close follow up after discharge. --Norco PRN for now, short duration prescription sent at discharge until follow up     Discharge Instructions   Discharge Instructions    Call MD for:   Complete by: As directed    Worsening shortness of breath, wheezing or having to use more oxygen to keep level above 88%   Call MD for:  extreme fatigue   Complete by: As directed    Call MD for:  persistant dizziness or light-headedness   Complete by: As directed    Call MD for:  severe uncontrolled pain   Complete by: As directed    Call MD for:  temperature >100.4   Complete by: As directed    Diet - low sodium heart healthy   Complete by: As directed    Discharge instructions   Complete by: As directed    Take Prednisone taper over next 4 days. Take Z-pack (azithromycin) for 4 days as well.  Please reschedule with the pain clinic  as soon as possible for your back pain.   Increase activity slowly   Complete by: As directed      Allergies as of 09/03/2020      Reactions   Morphine And Related Itching      Medication List    TAKE these medications   albuterol 108 (90 Base) MCG/ACT inhaler Commonly known as: VENTOLIN HFA Inhale 2 puffs into the lungs every 4 (four) hours as needed for wheezing or shortness of breath.   azithromycin 500 MG tablet Commonly known as: ZITHROMAX Take 1 tablet (500 mg  total) by mouth daily.   budesonide 0.5 MG/2ML nebulizer solution Commonly known as: PULMICORT Take 0.5 mg by nebulization 2 (two) times daily.   budesonide-formoterol 160-4.5 MCG/ACT inhaler Commonly known as: Symbicort Inhale 2 puffs into the lungs 2 (two) times daily.   dextromethorphan-guaiFENesin 30-600 MG 12hr tablet Commonly known as: MUCINEX DM Take 1 tablet by mouth 2 (two) times daily.   finasteride 5 MG tablet Commonly known as: PROSCAR Take 5 mg by mouth daily.   HYDROcodone-acetaminophen 5-325 MG tablet Commonly known as: NORCO/VICODIN Take 1-2 tablets by mouth every 4 (four) hours as needed for moderate pain or severe pain.   ipratropium-albuterol 0.5-2.5 (3) MG/3ML Soln Commonly known as: DUONEB Take 3 mLs by nebulization every 4 (four) hours as needed (SOB, wheezing). What changed: when to take this   meloxicam 15 MG tablet Commonly known as: MOBIC Take 15 mg by mouth daily.   metFORMIN 500 MG tablet Commonly known as: GLUCOPHAGE Take 500 mg by mouth every morning.   predniSONE 10 MG tablet Commonly known as: DELTASONE Take 4 tablets (40 mg total) by mouth daily with breakfast for 1 day, THEN 3 tablets (30 mg total) daily with breakfast for 1 day, THEN 2 tablets (20 mg total) daily with breakfast for 1 day, THEN 1 tablet (10 mg total) daily with breakfast for 1 day. Start taking on: September 03, 2020   sertraline 50 MG tablet Commonly known as: ZOLOFT Take 50 mg by mouth daily.   sildenafil 20 MG tablet Commonly known as: REVATIO Take 20 mg by mouth daily as needed.   Trelegy Ellipta 100-62.5-25 MCG/INH Aepb Generic drug: Fluticasone-Umeclidin-Vilant Inhale 1 puff into the lungs daily.       Allergies  Allergen Reactions  . Morphine And Related Itching    Consultations:  none    Procedures/Studies: MR CERVICAL SPINE WO CONTRAST  Result Date: 08/09/2020 CLINICAL DATA:  Neck pain with right shoulder pain EXAM: MRI CERVICAL SPINE  WITHOUT CONTRAST TECHNIQUE: Multiplanar, multisequence MR imaging of the cervical spine was performed. No intravenous contrast was administered. COMPARISON:  None. FINDINGS: Alignment: 3 mm anterolisthesis C4-5. Mild retrolisthesis C6-7. Mild cervical kyphosis at C4-5. Vertebrae: Normal bone marrow.  Negative for fracture or mass. Cord: Normal signal and morphology. Posterior Fossa, vertebral arteries, paraspinal tissues: Negative Disc levels: C2-3: Small central disc protrusion. Negative for spinal or foraminal stenosis C3-4: Mild facet degeneration bilaterally. Mild disc degeneration and spurring. Mild foraminal narrowing bilaterally. C4-5: 3 mm anterolisthesis with moderate facet degeneration bilaterally. Moderate left foraminal stenosis and mild right foraminal stenosis due to spurring. Mild spinal stenosis with cord flattening on the left. C5-6: Disc degeneration with diffuse uncinate spurring right greater than left. Cord flattening on the right with mild spinal stenosis. Moderate right foraminal stenosis. Mild left foraminal stenosis. C6-7: Disc degeneration and spondylosis with diffuse uncinate spurring. Mild spinal stenosis and moderate foraminal stenosis bilaterally. C7-T1: Disc degeneration and  diffuse uncinate spurring. Mild spinal stenosis and moderate foraminal stenosis bilaterally. T1-2: Disc degeneration and spondylosis. Moderate foraminal stenosis bilaterally and mild spinal stenosis. IMPRESSION: Cervical spondylosis. Multilevel disc and facet degeneration causing spinal and foraminal stenosis at multiple levels. No fracture or cord compression. Electronically Signed   By: Franchot Gallo M.D.   On: 08/09/2020 14:26   MR LUMBAR SPINE WO CONTRAST  Result Date: 08/09/2020 CLINICAL DATA:  Right buttock and leg pain EXAM: MRI LUMBAR SPINE WITHOUT CONTRAST TECHNIQUE: Multiplanar, multisequence MR imaging of the lumbar spine was performed. No intravenous contrast was administered. COMPARISON:  None.  FINDINGS: Segmentation:  Standard. Alignment:  Trace anterolisthesis at L4-L5. Vertebrae: Vertebral body heights are maintained. Minor degenerative marrow edema together with irregularity and Schmorl's nodes at the L5 inferior endplate. Otherwise no significant marrow edema. Probable small vertebral body hemangioma the right at L3. No suspicious osseous lesion. Conus medullaris and cauda equina: Conus extends to the L1-L2 level. Conus and cauda equina appear normal. Paraspinal and other soft tissues: Unremarkable. Disc levels: L1-L2:  No canal or foraminal stenosis. L2-L3:  No canal or foraminal stenosis. L3-L4:  No canal or foraminal stenosis. L4-L5: Anterolisthesis with uncovering of disc bulge and superimposed right foraminal protrusion. Marked facet arthropathy with ligamentum flavum infolding. Marked canal stenosis with effacement of the right greater than left subarticular recesses. Marked right foraminal stenosis with compression of the exiting L4 nerve root. Minor left foraminal stenosis. L5-S1: Disc bulge with endplate osteophytic ridging and superimposed left foraminal protrusion. Mild right and marked left facet arthropathy with ligamentum flavum infolding. Mild canal stenosis. Partial effacement of the left lateral recess. Mild right and moderate left foraminal stenosis. IMPRESSION: Lower lumbar degenerative changes as detailed above. Most notably, there is significant canal, subarticular recess, and right foraminal stenosis at L4-L5. Electronically Signed   By: Macy Mis M.D.   On: 08/09/2020 13:43   DG Chest Portable 1 View  Result Date: 09/02/2020 CLINICAL DATA:  Shortness of breath, respiratory distress EXAM: PORTABLE CHEST 1 VIEW COMPARISON:  05/02/2020 FINDINGS: Minimal basilar atelectasis versus scarring. Slightly decreased lung volumes. Normal heart size and vascularity. No significant focal pneumonia, collapse or consolidation. Negative for edema, effusion or pneumothorax. Trachea  midline. IMPRESSION: Low volume exam with basilar atelectasis versus scarring. No other acute chest process. Electronically Signed   By: Jerilynn Mages.  Shick M.D.   On: 09/02/2020 13:30       Subjective: Patient up in chair when seen this AM. Says he feels wonderful and immediately asks about going home.   Denies SOB any worse than baseline.  No chest pain fever/chills or other complaints.    Discharge Exam: Vitals:   09/03/20 0327 09/03/20 0736  BP: 120/64 121/74  Pulse: (!) 59 (!) 53  Resp: 20 18  Temp: 97.6 F (36.4 C) 97.7 F (36.5 C)  SpO2: 100% 99%   Vitals:   09/02/20 2352 09/03/20 0207 09/03/20 0327 09/03/20 0736  BP:  (!) 153/99 120/64 121/74  Pulse:  74 (!) 59 (!) 53  Resp:  (!) 21 20 18   Temp:   97.6 F (36.4 C) 97.7 F (36.5 C)  TempSrc:   Oral Oral  SpO2: 94% 95% 100% 99%  Weight:   107.8 kg   Height:   6\' 3"  (1.905 m)     General: Pt is alert, awake, not in acute distress Cardiovascular: RRR, S1/S2 +, no rubs, no gallops Respiratory: improved aeration, no wheezing, no rhonchi Abdominal: Soft, NT, ND, bowel sounds + Extremities:  no edema, no cyanosis    The results of significant diagnostics from this hospitalization (including imaging, microbiology, ancillary and laboratory) are listed below for reference.     Microbiology: Recent Results (from the past 240 hour(s))  SARS Coronavirus 2 by RT PCR (hospital order, performed in [facility] hospital lab) Nasopharyngeal Nasopharyngeal Swab     Status: None   Collection Time: 09/02/20  1:28 PM   Specimen: Nasopharyngeal Swab  Result Value Ref Range Status   SARS Coronavirus 2 NEGATIVE NEGATIVE Final    Comment: (NOTE) SARS-CoV-2 target nucleic acids are NOT DETECTED.  The SARS-CoV-2 RNA is generally detectable in upper and lower respiratory specimens during the acute phase of infection. The lowest concentration of SARS-CoV-2 viral copies this assay can detect is 250 copies / mL. A negative result does not  preclude SARS-CoV-2 infection and should not be used as the sole basis for treatment or other patient management decisions.  A negative result may occur with improper specimen collection / handling, submission of specimen other than nasopharyngeal swab, presence of viral mutation(s) within the areas targeted by this assay, and inadequate number of viral copies (<250 copies / mL). A negative result must be combined with clinical observations, patient history, and epidemiological information.  Fact Sheet for Patients:   StrictlyIdeas.no  Fact Sheet for Healthcare Providers: BankingDealers.co.za  This test is not yet approved or  cleared by the [place] FDA and has been authorized for detection and/or diagnosis of SARS-CoV-2 by FDA under an Emergency Use Authorization (EUA).  This EUA will remain in effect (meaning this test can be used) for the duration of the COVID-19 declaration under Section 564(b)(1) of the Act, 21 U.S.C. section 360bbb-3(b)(1), unless the authorization is terminated or revoked sooner.  Performed at [facility], [address] [place],  [address]      Labs: BNP (last 3 results) No results for input(s): BNP in the last 8760 hours. Basic Metabolic Panel: Recent Labs  Lab 09/02/20 1305 09/03/20 0514  NA 138 138  K 3.7 4.3  CL 107 106  CO2 18* 22  GLUCOSE 93 180*  BUN 15 21  CREATININE 0.90 0.94  CALCIUM 9.0 9.2   Liver Function Tests: Recent Labs  Lab 09/02/20 1305  AST 19  ALT 18  ALKPHOS 63  BILITOT 0.5  PROT 6.7  ALBUMIN 3.8   No results for input(s): LIPASE, AMYLASE in the last 168 hours. No results for input(s): AMMONIA in the last 168 hours. CBC: Recent Labs  Lab 09/02/20 1305 09/03/20 0514  WBC 9.0 14.2*  NEUTROABS 4.7  --   HGB 15.2 14.8  HCT 47.1 44.4  MCV 84.0 81.6  PLT 357 382   Cardiac Enzymes: No results for input(s): CKTOTAL, CKMB, CKMBINDEX,  TROPONINI in the last 168 hours. BNP: Invalid input(s): POCBNP CBG: Recent Labs  Lab 09/02/20 1900 09/03/20 0737  GLUCAP 154* 135*   D-Dimer No results for input(s): DDIMER in the last 72 hours. Hgb A1c Recent Labs    09/03/20 0514  HGBA1C 6.4*   Lipid Profile No results for input(s): CHOL, HDL, LDLCALC, TRIG, CHOLHDL, LDLDIRECT in the last 72 hours. Thyroid function studies No results for input(s): TSH, T4TOTAL, T3FREE, THYROIDAB in the last 72 hours.  Invalid input(s): FREET3 Anemia work up No results for input(s): VITAMINB12, FOLATE, FERRITIN, TIBC, IRON, RETICCTPCT in the last 72 hours. Urinalysis    Component Value Date/Time   COLORURINE YELLOW (A) 07/29/2018 1917   APPEARANCEUR CLEAR (A)  07/29/2018 [place] 10/04/2017 1106   LABSPEC 1.013 07/29/2018 [place] 7.0 07/29/2018 [place] 07/29/2018 [place] NEGATIVE 07/29/2018 [place] NEGATIVE 07/29/2018 1917   BILIRUBINUR Negative 10/04/2017 1106   [place] 07/29/2018 [place] NEGATIVE 07/29/2018 1917   NITRITE NEGATIVE 07/29/2018 1917   LEUKOCYTESUR NEGATIVE 07/29/2018 1917   LEUKOCYTESUR Negative 10/04/2017 1106   Sepsis Labs Invalid input(s): PROCALCITONIN,  WBC,  LACTICIDVEN Microbiology Recent Results (from the past 240 hour(s))  SARS Coronavirus 2 by RT PCR (hospital order, performed in [place] hospital lab) Nasopharyngeal Nasopharyngeal Swab     Status: None   Collection Time: 09/02/20  1:28 PM   Specimen: Nasopharyngeal Swab  Result Value Ref Range Status   SARS Coronavirus 2 NEGATIVE NEGATIVE Final    Comment: (NOTE) SARS-CoV-2 target nucleic acids are NOT DETECTED.  The SARS-CoV-2 RNA is generally detectable in upper and lower respiratory specimens during the acute phase of infection. The lowest concentration of SARS-CoV-2 viral copies this assay can detect is 250 copies / mL. A negative result does not preclude SARS-CoV-2  infection and should not be used as the sole basis for treatment or other patient management decisions.  A negative result may occur with improper specimen collection / handling, submission of specimen other than nasopharyngeal swab, presence of viral mutation(s) within the areas targeted by this assay, and inadequate number of viral copies (<250 copies / mL). A negative result must be combined with clinical observations, patient history, and epidemiological information.  Fact Sheet for Patients:   StrictlyIdeas.no  Fact Sheet for Healthcare Providers: BankingDealers.co.za  This test is not yet approved or  cleared by the [place] FDA and has been authorized for detection and/or diagnosis of SARS-CoV-2 by FDA under an Emergency Use Authorization (EUA).  This EUA will remain in effect (meaning this test can be used) for the duration of the COVID-19 declaration under Section 564(b)(1) of the Act, 21 U.S.C. section 360bbb-3(b)(1), unless the authorization is terminated or revoked sooner.  Performed at [facility] [address] [place], [place] [address]      Time coordinating discharge: Over 30 minutes  SIGNED:   Ezekiel Slocumb, DO [facility] 09/03/2020, 10:45 AM   If 7PM-7AM, please contact night-coverage [email or web address]

## 2020-09-03 NOTE — Evaluation (Signed)
Occupational Therapy Evaluation Patient Details Name: Ernest [name] MRN: [id number] DOB: 31-Dec-1955 Today's Date: 09/03/2020    History of Present Illness 64 y.o. male with medical history significant of COPD on 3 L/min oxygen at baseline, type 2 diabetes, prostate cancer, chronic low back with radiculopathy who presented to the ED after a rapid response was called in [facility] where patient was being seen in the pain clinic as a new patient. ED w/u included CMP and CBC-unremarkable, Covid-19 PCR negative, CXR negative for acute process. Pt adm for COPD exacerbation, required Bipap initially, now down to his chronic 3Lnc.   Clinical Impression   Pt was seen for OT evaluation this date. Prior to hospital admission, pt was Indep with self care ADLs and fxl mobility with no AD (has portable concentrator). Pt lives alone in hotel versus with his son/fiance/grandson in 2 story home with level entry with full bath on second floor (Pt reports alternating based on financial situation, but prefers hotel, in addition, reports being on a list for a low income apartment). Currently pt demonstrates impairments as described below (See OT problem list) which functionally limit his ability to perform ADL/self-care tasks. Pt currently requires MIN A with LB ADLs and CGA with ADL transfers and fxl mobility with RW.  Pt would benefit from skilled OT services to address noted impairments and functional limitations (see below for any additional details) in order to maximize safety and independence while minimizing falls risk and caregiver burden. Upon hospital discharge, recommend HHOT to maximize pt safety/home setup for energy conservation. Anticipate pt could potentially benefit from pulmonary rehab if appropriate. O2 montiored throughout on the 3Lnc.  At rest, spO2 on 3L at 97-100% and RR 18-22. With fxl mobility/transfers does not drop below 94%. However, RR increases to 30s with transitions/transfers/more  exertional tasks.   Follow Up Recommendations  Other (comment);Supervision - Intermittent;Home health OT (could potentially benefit from pulmonary rehab if appropriate)    Equipment Recommendations  Tub/shower seat;Other (comment) (2WW)    Recommendations for Other Services       Precautions / Restrictions Precautions Precautions: Fall Restrictions Weight Bearing Restrictions: No Other Position/Activity Restrictions: monitor RR, O2      Mobility Bed Mobility Overal bed mobility: Modified Independent             General bed mobility comments: HOB elevated, use of bed rails  Transfers Overall transfer level: Needs assistance Equipment used: 1 person hand held assist Transfers: Sit to/from Stand Sit to Stand: Min guard         General transfer comment: Pt instable with rising to stand, not wanting to utilize RW, but noted to attempt to hold the sink cound with transition to standing, HHA given and then Teale offered and pt uses for remainder    Balance Overall balance assessment: Needs assistance Sitting-balance support: Feet supported Sitting balance-Leahy Scale: Normal     Standing balance support: Bilateral upper extremity supported Standing balance-Leahy Scale: Fair Standing balance comment: benefits from UE support to sustain balance both statically and with fxl mobility                           ADL either performed or assessed with clinical judgement   ADL Overall ADL's : Needs assistance/impaired Eating/Feeding: Independent   Grooming: Wash/dry hands;Standing;Supervision/safety           Upper Body Dressing : Independent;Sitting   Lower Body Dressing: Minimal assistance;Sit to/from stand Lower Body Dressing  Details (indicate cue type and reason): MIN A to initiate to don socks d/t SOB/increased WOB associated with bending at the waist or cross-leg technique and sharp toe nails make threading sock difficult. Toilet Transfer: Min  guard;RW;Ambulation;Grab bars   Toileting- Water quality scientist and Hygiene: Supervision/safety;Sit to/from stand       Functional mobility during ADLs: Min guard;Rolling Salvador       Vision Patient Visual Report: No change from baseline       Perception     Praxis      Pertinent Vitals/Pain Pain Assessment: No/denies pain     Hand Dominance Right   Extremity/Trunk Assessment Upper Extremity Assessment Upper Extremity Assessment: Overall WFL for tasks assessed   Lower Extremity Assessment Lower Extremity Assessment: Defer to PT evaluation       Communication Communication Communication: No difficulties   Cognition Arousal/Alertness: Awake/alert Behavior During Therapy: WFL for tasks assessed/performed Overall Cognitive Status: Within Functional Limits for tasks assessed                                     General Comments  O2 montiored throughout on the 3Lnc, does not drop below 94% with fxl mobility/transfers. However, RR increases to 30s with transitions/transfers/more exertional tasks. At rest, spO2 on 3L at 97-100% and RR 18-22.    Exercises Other Exercises Other Exercises: OT facilitates ed re: potential EC strategies such as utilizing AE for LB dressing/bathing, shower seat for bathing, etc. Pt with good understanding but would benefit from f/u and more strategies to maximize independence/tolerance for I/ADLs.   Shoulder Instructions      Home Living Family/patient expects to be discharged to:: Private residence (Pt stays with his son, but tries to stay in hotel when he can afford it. Reports not getting along well with son's fiance and having difficulty making it to the bathroom on the second floor of son's home. Reports applying for low income apt.) Living Arrangements: Children;Other relatives (son, fiance, and grandson.) Available Help at Discharge: Family Type of Home: Apartment Home Access: Level entry     Home Layout: Two  level;1/2 bath on main level;Bed/bath upstairs Alternate Level Stairs-Number of Steps: flight   Bathroom Shower/Tub: Teacher, early years/pre: Standard     Home Equipment: Cane - single point   Additional Comments: Has a portable concerntrator for AMB, and a larger one for over night. Reports 1 fall this year in [place] before he came here in april d/t slipping on snow. Reports imperfect balance at baseline.      Prior Functioning/Environment Level of Independence: Independent        Comments: Pt chronically on 3Lnc, States he has a cane, but does not use. Reports walking to grocery store and using push cart when he is staying in hotel. States he rarely drives.        OT Problem List: Decreased strength;Decreased activity tolerance;Decreased knowledge of use of DME or AE;Cardiopulmonary status limiting activity      OT Treatment/Interventions: Self-care/ADL training;DME and/or AE instruction;Therapeutic activities;Balance training;Therapeutic exercise;Energy conservation;Patient/family education    OT Goals(Current goals can be found in the care plan section) Acute Rehab OT Goals Patient Stated Goal: to get back on my feet and get my own apartment OT Goal Formulation: With patient Time For Goal Achievement: 09/17/20 Potential to Achieve Goals: Good ADL Goals Pt Will Perform Lower Body Dressing: with set-up;with adaptive equipment;sit to/from stand (AE  for EC) Pt Will Transfer to Toilet: with supervision;ambulating;grab bars (with LRAD) Pt/caregiver will Perform Home Exercise Program: Increased strength;Both right and left upper extremity;With Supervision Additional ADL Goal #1: Pt will verbalize 3 EC strategies with 0% verbal cues.  OT Frequency: Min 1X/week   Barriers to D/C:            Co-evaluation              AM-PAC OT "6 Clicks" Daily Activity     Outcome Measure Help from another person eating meals?: None Help from another person taking care of  personal grooming?: A Little Help from another person toileting, which includes using toliet, bedpan, or urinal?: A Little Help from another person bathing (including washing, rinsing, drying)?: A Little Help from another person to put on and taking off regular upper body clothing?: None Help from another person to put on and taking off regular lower body clothing?: A Little 6 Click Score: 20   End of Session Equipment Utilized During Treatment: Gait belt;Rolling Meyn;Oxygen  Activity Tolerance: Patient tolerated treatment well Patient left: in chair;with call bell/phone within reach;with chair alarm set  OT Visit Diagnosis: Unsteadiness on feet (R26.81);Muscle weakness (generalized) (M62.81)                Time: 7395-8441 OT Time Calculation (min): 39 min Charges:  OT General Charges $OT Visit: 1 Visit OT Evaluation $OT Eval Moderate Complexity: 1 Mod OT Treatments $Self Care/Home Management : 8-22 mins $Therapeutic Activity: 8-22 mins  Gerrianne Scale, MS, OTR/L ascom [phone] 09/03/20, 10:34 AM

## 2020-09-03 NOTE — Evaluation (Signed)
Physical Therapy Evaluation Patient Details Name: Ernest [name] MRN: [id number] DOB: 04-25-1956 Today's Date: 09/03/2020   History of Present Illness  64 y.o. male with medical history significant of COPD on 3 L/min oxygen at baseline, type 2 diabetes, prostate cancer, chronic low back with radiculopathy who presented to the ED after a rapid response was called in [facility] building where patient was being seen in the pain clinic as a new patient. ED w/u included CMP and CBC-unremarkable, Covid-19 PCR negative, CXR negative for acute process. Pt adm for COPD exacerbation, acute on chronic respiratory failure, required Bipap initially, now down to his chronic 3Lnc.  Clinical Impression  Pt was pleasant and motivated to participate during the session. Pt chronically uses 3L O2 at baseline for community ambulation and while sleeping. Pt reports ambuating at home with no oxygen. During the session, pt's SpO2 remained in the mid 90's. SpO2 was assessed with on room air while resting and was 95%. After ambulating 30' pt's Sp O2 was 93, after 60' SpO2 was 95%, and after 160', pt's SpO2 was 95%. Pt able to transfer and ambulate with min guard and SPV with no loss of balance. Some postural sway was noted but the pt notes his L LE can be unstable at baseline. Will complete PT orders at this time but will reassess pt pending a change in status upon receipt of new PT orders.     Follow Up Recommendations No PT follow up    Equipment Recommendations  None recommended by PT    Recommendations for Other Services       Precautions / Restrictions Precautions Precautions: Fall Restrictions Weight Bearing Restrictions: No Other Position/Activity Restrictions: monitor RR, O2 at rest and with ambulation      Mobility  Bed Mobility Overal bed mobility: Modified Independent             General bed mobility comments: HOB elevated, use of bed rails- not formally assesed. Taken from  OT  Transfers Overall transfer level: Needs assistance Equipment used: None Transfers: Sit to/from Stand Sit to Stand: Min guard         General transfer comment: pt able to easily come to standing and is stable when standing. some postural sway noted  Ambulation/Gait Ambulation/Gait assistance: Min guard Gait Distance (Feet): 30 Feet x1, 60 Feet x 1, 160 Feet x 1 Assistive device: None Gait Pattern/deviations: WFL(Within Functional Limits)     General Gait Details: pt able to ambulate 160' on room air with 02 staying in the mid 90s. pt breathing harder than at rest but [facility]  Stairs            Wheelchair Mobility    Modified Rankin (Stroke Patients Only)       Balance Overall balance assessment: Modified Independent Sitting-balance support: Feet supported Sitting balance-Leahy Scale: Normal     Standing balance support: No upper extremity supported Standing balance-Leahy Scale: Good Standing balance comment: pt able to maintain quiet stance with no AD. Pt does have some increased postural sway but is steady                             Pertinent Vitals/Pain Pain Assessment: No/denies pain    Home Living Family/patient expects to be discharged to:: Private residence Living Arrangements: Children;Other relatives Available Help at Discharge: Family Type of Home: Apartment (pt lives with son for 1 week out of the month and for 3 weeks out of  the month in a hotel with wife) Home Access: Level entry     Home Layout: One level;Other (Comment) (son's home has 2 levels and pt needs to use bathroom upstairs) Home Equipment: Cane - single point Additional Comments: Has a portable concerntrator for AMB, and a larger one for over night. pt uses O2 at night and for community ambulation. pt does not typically use oxygen at home when awake    Prior Function Level of Independence: Independent         Comments: Pt chornically uses 3L. Ocasionally uses cane  when his knee feels weak     Hand Dominance   Dominant Hand: Right    Extremity/Trunk Assessment   Upper Extremity Assessment Upper Extremity Assessment: Overall WFL for tasks assessed    Lower Extremity Assessment Lower Extremity Assessment: Defer to PT evaluation       Communication   Communication: No difficulties  Cognition Arousal/Alertness: Awake/alert Behavior During Therapy: WFL for tasks assessed/performed Overall Cognitive Status: Within Functional Limits for tasks assessed                                        General Comments General comments (skin integrity, edema, etc.): O2 monitored throughout sess. Baseline on 3L O2 was 97%. Baseline on room air was 95%. After ambulating 160' on room air, SpO2 was 95%    Exercises Total Joint Exercises Long Arc Quad: AROM;Both;10 reps Other Exercises Other Exercises: pt educated on monitoring O2 at home and strategies for energy conservation   Assessment/Plan    PT Assessment Patent does not need any further PT services  PT Problem List         PT Treatment Interventions      PT Goals (Current goals can be found in the Care Plan section)  Acute Rehab PT Goals Patient Stated Goal: to get back on my feet and get my own apartment PT Goal Formulation: All assessment and education complete, DC therapy    Frequency     Barriers to discharge        Co-evaluation               AM-PAC PT "6 Clicks" Mobility  Outcome Measure Help needed turning from your back to your side while in a flat bed without using bedrails?: None Help needed moving from lying on your back to sitting on the side of a flat bed without using bedrails?: None Help needed moving to and from a bed to a chair (including a wheelchair)?: None Help needed standing up from a chair using your arms (e.g., wheelchair or bedside chair)?: None Help needed to walk in hospital room?: A Little Help needed climbing 3-5 steps with a  railing? : A Little 6 Click Score: 22    End of Session Equipment Utilized During Treatment: Gait belt;Oxygen Activity Tolerance: Patient tolerated treatment well Patient left: in chair;with call bell/phone within reach;with chair alarm set Nurse Communication: Mobility status PT Visit Diagnosis: Difficulty in walking, not elsewhere classified (R26.2)    Time: 5638-7564 PT Time Calculation (min) (ACUTE ONLY): 28 min   Charges:              Hervey Ard, SPT 09/03/20. 11:47 AM

## 2020-09-08 ENCOUNTER — Other Ambulatory Visit: Payer: Self-pay

## 2020-09-08 ENCOUNTER — Encounter: Payer: Self-pay | Admitting: Student in an Organized Health Care Education/Training Program

## 2020-09-08 ENCOUNTER — Ambulatory Visit
Payer: Medicare HMO | Attending: Student in an Organized Health Care Education/Training Program | Admitting: Student in an Organized Health Care Education/Training Program

## 2020-09-08 VITALS — BP 144/88 | HR 104 | Temp 99.3°F | Resp 24 | Ht 75.0 in | Wt 240.0 lb

## 2020-09-08 DIAGNOSIS — G894 Chronic pain syndrome: Secondary | ICD-10-CM | POA: Diagnosis present

## 2020-09-08 DIAGNOSIS — J441 Chronic obstructive pulmonary disease with (acute) exacerbation: Secondary | ICD-10-CM

## 2020-09-08 DIAGNOSIS — M48062 Spinal stenosis, lumbar region with neurogenic claudication: Secondary | ICD-10-CM | POA: Diagnosis present

## 2020-09-08 DIAGNOSIS — G8929 Other chronic pain: Secondary | ICD-10-CM

## 2020-09-08 DIAGNOSIS — M5136 Other intervertebral disc degeneration, lumbar region: Secondary | ICD-10-CM | POA: Insufficient documentation

## 2020-09-08 DIAGNOSIS — M5416 Radiculopathy, lumbar region: Secondary | ICD-10-CM | POA: Diagnosis present

## 2020-09-08 DIAGNOSIS — M47816 Spondylosis without myelopathy or radiculopathy, lumbar region: Secondary | ICD-10-CM

## 2020-09-08 DIAGNOSIS — M51369 Other intervertebral disc degeneration, lumbar region without mention of lumbar back pain or lower extremity pain: Secondary | ICD-10-CM

## 2020-09-08 MED ORDER — GABAPENTIN 100 MG PO CAPS: ORAL_CAPSULE | ORAL | 0 refills | Status: DC

## 2020-09-08 NOTE — Progress Notes (Signed)
Safety precautions to be maintained throughout the outpatient stay will include: orient to surroundings, keep bed in low position, maintain call bell within reach at all times, provide assistance with transfer out of bed and ambulation.  

## 2020-09-08 NOTE — Patient Instructions (Signed)
____________________________________________________________________________________________  Preparing for Procedure with Sedation  Procedure appointments are limited to planned procedures: . No Prescription Refills. . No disability issues will be discussed. . No medication changes will be discussed.  Instructions: . Oral Intake: Do not eat or drink anything for at least 8 hours prior to your procedure. (Exception: Blood Pressure Medication. See below.) . Transportation: Unless otherwise stated by your physician, you may drive yourself after the procedure. . Blood Pressure Medicine: Do not forget to take your blood pressure medicine with a sip of water the morning of the procedure. If your Diastolic (lower reading)is above 100 mmHg, elective cases will be cancelled/rescheduled. . Blood thinners: These will need to be stopped for procedures. Notify our staff if you are taking any blood thinners. Depending on which one you take, there will be specific instructions on how and when to stop it. . Diabetics on insulin: Notify the staff so that you can be scheduled 1st case in the morning. If your diabetes requires high dose insulin, take only  of your normal insulin dose the morning of the procedure and notify the staff that you have done so. . Preventing infections: Shower with an antibacterial soap the morning of your procedure. . Build-up your immune system: Take 1000 mg of Vitamin C with every meal (3 times a day) the day prior to your procedure. . Antibiotics: Inform the staff if you have a condition or reason that requires you to take antibiotics before dental procedures. . Pregnancy: If you are pregnant, call and cancel the procedure. . Sickness: If you have a cold, fever, or any active infections, call and cancel the procedure. . Arrival: You must be in the facility at least 30 minutes prior to your scheduled procedure. . Children: Do not bring children with you. . Dress appropriately:  Bring dark clothing that you would not mind if they get stained. . Valuables: Do not bring any jewelry or valuables.  Reasons to call and reschedule or cancel your procedure: (Following these recommendations will minimize the risk of a serious complication.) . Surgeries: Avoid having procedures within 2 weeks of any surgery. (Avoid for 2 weeks before or after any surgery). . Flu Shots: Avoid having procedures within 2 weeks of a flu shots or . (Avoid for 2 weeks before or after immunizations). . Barium: Avoid having a procedure within 7-10 days after having had a radiological study involving the use of radiological contrast. (Myelograms, Barium swallow or enema study). . Heart attacks: Avoid any elective procedures or surgeries for the initial 6 months after a "Myocardial Infarction" (Heart Attack). . Blood thinners: It is imperative that you stop these medications before procedures. Let us know if you if you take any blood thinner.  . Infection: Avoid procedures during or within two weeks of an infection (including chest colds or gastrointestinal problems). Symptoms associated with infections include: Localized redness, fever, chills, night sweats or profuse sweating, burning sensation when voiding, cough, congestion, stuffiness, runny nose, sore throat, diarrhea, nausea, vomiting, cold or Flu symptoms, recent or current infections. It is specially important if the infection is over the area that we intend to treat. . Heart and lung problems: Symptoms that may suggest an active cardiopulmonary problem include: cough, chest pain, breathing difficulties or shortness of breath, dizziness, ankle swelling, uncontrolled high or unusually low blood pressure, and/or palpitations. If you are experiencing any of these symptoms, cancel your procedure and contact your primary care physician for an evaluation.  Remember:  Regular Business hours are:    Monday to Thursday 8:00 AM to 4:00 PM  Provider's  Schedule: Milinda Pointer, MD:  Procedure days: Tuesday and Thursday 7:30 AM to 4:00 PM  Gillis Santa, MD:  Procedure days: Monday and Wednesday 7:30 AM to 4:00 PM ____________________________________________________________________________________________  A prescription for Gabapentin has been sent to your pharmacy.

## 2020-09-08 NOTE — Progress Notes (Signed)
Patient: Ernest [name]  Service Category: E/M  Provider: Gillis Santa, MD  DOB: May 29, 1956  DOS: 09/08/2020  Referring Provider: Lonell Face, NP  MRN: [id number]  Setting: Ambulatory outpatient  PCP: Tracie Harrier, MD  Type: New Patient  Specialty: Interventional Pain Management    Location: Office  Delivery: Face-to-face     Primary Reason(s) for Visit: Encounter for initial evaluation of one or more chronic problems (new to examiner) potentially causing chronic pain, and posing a threat to normal musculoskeletal function. (Level of risk: High) CC: Back Pain, Knee Pain, and Neck Pain (herniated disc)  HPI  Ernest [name] is a 64 y.o. year old, male patient, who comes for the first time to our practice referred by Lonell Face, NP for our initial evaluation of his chronic pain. He has Acute on chronic respiratory failure with hypoxia ([place]); COPD with acute exacerbation ([place]); COPD with acute bronchitis ([place]); Leukocytosis; Elevated PSA; Benign prostatic hyperplasia with lower urinary tract symptoms; Prostate cancer ([place]); COPD exacerbation ([place]); Type 2 diabetes mellitus ([place]); Lumbar spondylosis; Spinal stenosis, lumbar region, with neurogenic claudication; Chronic radicular lumbar pain; Lumbar degenerative disc disease; and Chronic pain syndrome on their problem list. Today he comes in for evaluation of his Back Pain, Knee Pain, and Neck Pain (herniated disc)  Pain Assessment: Location: Lower Back Radiating:  In a nondermatomal fashion into his upper buttocks and posterior thighs Onset: More than a month ago Duration: Chronic pain Quality: Stabbing Severity: 10-Worst pain ever/10 (subjective, self-reported pain score)  Effect on ADL: limits activities Timing: Constant Modifying factors: getting off of it, lying down BP: (!) 144/88  HR: (!) 104  Onset and Duration: Date of onset: 06/2005 Cause of pain: not answered Severity: Getting worse Timing: Morning, Afternoon and  Night Aggravating Factors: none checked Alleviating Factors: Lying down, Resting, Sitting, Sleeping and Standing Associated Problems: Pain that wakes patient up and Pain that does not allow patient to sleep Quality of Pain: Aching, Agonizing, Constant, Distressing, Dreadful, Pulsating, Stabbing, Throbbing and Uncomfortable Previous Examinations or Tests: CT scan, Ct-Myelogram, MRI scan and X-rays Previous Treatments: The patient denies .  Ernest [name] is a pleasant 64 year old male with COPD, dependent upon oxygen, 3 L, who presents with a chief complaint of low back and sacral pain.  Patient moved from [place] in April 2021.  There he was seeing a pain physician, Dr. Vicie Mutters for his low back and leg pain.  This is multifactorial in etiology and related to lumbar spinal stenosis with neurogenic claudication, lumbar facet arthropathy, lumbar spondylosis, lumbar degenerative disc disease.  Of note patient has tried lumbar epidural steroid injection x3 in [place] which provided mild benefit at best.  He is complaining of pain that is worse with facet loading and lumbar extension.  He has tried physical therapy exercises for his low back in [place] which were not very helpful.  He has not done any physical therapy for his legs.  I have encouraged him to consider this.  Of note, prior to moving, patient's pain physician in [place] had recommended diagnostic lumbar facet medial branch nerve blocks given his facet mediated pain however this was denied by insurance.  Patient denies having tried gabapentin in the past.  He is currently not on any opioid medications.  He denies any bowel or bladder dysfunction.  He has severe coccydynia that limits his ability to sit stationary for an extended period of time.   Historic Controlled Substance Pharmacotherapy Review  Historical Monitoring: The patient  reports no history of  drug use. List of all UDS Test(s): No results found for: MDMA, COCAINSCRNUR, [place],  [place], CANNABQUANT, THCU, [place] List of other Serum/Urine Drug Screening Test(s):  No results found for: AMPHSCRSER, BARBSCRSER, BENZOSCRSER, COCAINSCRSER, COCAINSCRNUR, PCPSCRSER, PCPQUANT, THCSCRSER, THCU, CANNABQUANT, OPIATESCRSER, OXYSCRSER, PROPOXSCRSER, ETH Historical Background Evaluation: [place] PMP: PDMP reviewed during this encounter. Online review of the past 41-monthperiod conducted.             [place] Department of public safety, offender search: (Editor, commissioningInformation) Non-contributory Risk Assessment Profile: Aberrant behavior: None observed or detected today Risk factors for fatal opioid overdose: None identified today Fatal overdose hazard ratio (HR): Calculation deferred Non-fatal overdose hazard ratio (HR): Calculation deferred Risk of opioid abuse or dependence: 0.7-3.0% with doses ? 36 MME/day and 6.1-26% with doses ? 120 MME/day. Substance use disorder (SUD) risk level: See below Personal History of Substance Abuse (SUD-Substance use disorder):  Alcohol: Negative  Illegal Drugs: Negative  Rx Drugs: Negative  ORT Risk Level calculation: Low Risk  Opioid Risk Tool - 09/08/20 1307      Family History of Substance Abuse   Alcohol Negative    Illegal Drugs Negative    Rx Drugs Negative      Personal History of Substance Abuse   Alcohol Negative    Illegal Drugs Negative    Rx Drugs Negative      Age   Age between 174-45years  No      History of Preadolescent Sexual Abuse   History of Preadolescent Sexual Abuse Negative or Male      Psychological Disease   Psychological Disease Negative    Depression Negative      Total Score   Opioid Risk Tool Scoring 0    Opioid Risk Interpretation Low Risk          ORT Scoring interpretation table:  Score <3 = Low Risk for SUD  Score between 4-7 = Moderate Risk for SUD  Score >8 = High Risk for Opioid Abuse   PHQ-2 Depression Scale:  Total score: 0  PHQ-2 Scoring interpretation table: (Score and probability of major  depressive disorder)  Score 0 = No depression  Score 1 = 15.4% Probability  Score 2 = 21.1% Probability  Score 3 = 38.4% Probability  Score 4 = 45.5% Probability  Score 5 = 56.4% Probability  Score 6 = 78.6% Probability   PHQ-9 Depression Scale:  Total score: 0  PHQ-9 Scoring interpretation table:  Score 0-4 = No depression  Score 5-9 = Mild depression  Score 10-14 = Moderate depression  Score 15-19 = Moderately severe depression  Score 20-27 = Severe depression (2.4 times higher risk of SUD and 2.89 times higher risk of overuse)   Pharmacologic Plan: As per protocol, I have not taken over any controlled substance management, pending the results of ordered tests and/or consults.            Initial impression: Pending review of available data and ordered tests.  Meds   Current Outpatient Medications:  .  albuterol (VENTOLIN HFA) 108 (90 Base) MCG/ACT inhaler, Inhale 2 puffs into the lungs every 4 (four) hours as needed for wheezing or shortness of breath., Disp: 8 g, Rfl: 0 .  budesonide (PULMICORT) 0.5 MG/2ML nebulizer solution, Take 0.5 mg by nebulization 2 (two) times daily., Disp: , Rfl:  .  budesonide-formoterol (SYMBICORT) 160-4.5 MCG/ACT inhaler, Inhale 2 puffs into the lungs 2 (two) times daily., Disp: 1 Inhaler, Rfl: 12 .  dextromethorphan-guaiFENesin (MUCINEX DM) 30-600 MG  12hr tablet, Take 1 tablet by mouth 2 (two) times daily., Disp: 14 tablet, Rfl: 0 .  finasteride (PROSCAR) 5 MG tablet, Take 5 mg by mouth daily., Disp: , Rfl:  .  HYDROcodone-acetaminophen (NORCO/VICODIN) 5-325 MG tablet, Take 1-2 tablets by mouth every 4 (four) hours as needed for moderate pain or severe pain., Disp: 30 tablet, Rfl: 0 .  ipratropium-albuterol (DUONEB) 0.5-2.5 (3) MG/3ML SOLN, Take 3 mLs by nebulization every 4 (four) hours as needed (SOB, wheezing). (Patient taking differently: Take 3 mLs by nebulization every 6 (six) hours as needed (SOB, wheezing). ), Disp: 360 mL, Rfl: 0 .  meloxicam  (MOBIC) 15 MG tablet, Take 15 mg by mouth daily., Disp: , Rfl:  .  metFORMIN (GLUCOPHAGE) 500 MG tablet, Take 500 mg by mouth every morning., Disp: , Rfl:  .  sertraline (ZOLOFT) 50 MG tablet, Take 50 mg by mouth daily., Disp: , Rfl:  .  sildenafil (REVATIO) 20 MG tablet, Take 20 mg by mouth daily as needed., Disp: , Rfl:  .  TRELEGY ELLIPTA 100-62.5-25 MCG/INH AEPB, Inhale 1 puff into the lungs daily., Disp: , Rfl:  .  azithromycin (ZITHROMAX) 500 MG tablet, Take 1 tablet (500 mg total) by mouth daily. (Patient not taking: Reported on 09/08/2020), Disp: 4 tablet, Rfl: 0 .  gabapentin (NEURONTIN) 100 MG capsule, Take 1 capsule (100 mg total) by mouth at bedtime for 15 days, THEN 2 capsules (200 mg total) at bedtime for 15 days, THEN 3 capsules (300 mg total) at bedtime., Disp: 135 capsule, Rfl: 0  Imaging Review  Cervical Imaging: Cervical MR wo contrast: Results for orders placed during the hospital encounter of 08/07/20  MR CERVICAL SPINE WO CONTRAST  Narrative CLINICAL DATA:  Neck pain with right shoulder pain  EXAM: MRI CERVICAL SPINE WITHOUT CONTRAST  TECHNIQUE: Multiplanar, multisequence MR imaging of the cervical spine was performed. No intravenous contrast was administered.  COMPARISON:  None.  FINDINGS: Alignment: 3 mm anterolisthesis C4-5. Mild retrolisthesis C6-7. Mild cervical kyphosis at C4-5.  Vertebrae: Normal bone marrow.  Negative for fracture or mass.  Cord: Normal signal and morphology.  Posterior Fossa, vertebral arteries, paraspinal tissues: Negative  Disc levels:  C2-3: Small central disc protrusion. Negative for spinal or foraminal stenosis  C3-4: Mild facet degeneration bilaterally. Mild disc degeneration and spurring. Mild foraminal narrowing bilaterally.  C4-5: 3 mm anterolisthesis with moderate facet degeneration bilaterally. Moderate left foraminal stenosis and mild right foraminal stenosis due to spurring. Mild spinal stenosis with  cord flattening on the left.  C5-6: Disc degeneration with diffuse uncinate spurring right greater than left. Cord flattening on the right with mild spinal stenosis. Moderate right foraminal stenosis. Mild left foraminal stenosis.  C6-7: Disc degeneration and spondylosis with diffuse uncinate spurring. Mild spinal stenosis and moderate foraminal stenosis bilaterally.  C7-T1: Disc degeneration and diffuse uncinate spurring. Mild spinal stenosis and moderate foraminal stenosis bilaterally.  T1-2: Disc degeneration and spondylosis. Moderate foraminal stenosis bilaterally and mild spinal stenosis.  IMPRESSION: Cervical spondylosis. Multilevel disc and facet degeneration causing spinal and foraminal stenosis at multiple levels.  No fracture or cord compression.   Electronically Signed By: Franchot Gallo M.D. On: 08/09/2020 14:26  CLumbosacral Imaging: Lumbar MR wo contrast: Results for orders placed during the hospital encounter of 08/07/20  MR LUMBAR SPINE WO CONTRAST  Narrative CLINICAL DATA:  Right buttock and leg pain  EXAM: MRI LUMBAR SPINE WITHOUT CONTRAST  TECHNIQUE: Multiplanar, multisequence MR imaging of the lumbar spine was performed. No intravenous contrast  was administered.  COMPARISON:  None.  FINDINGS: Segmentation:  Standard.  Alignment:  Trace anterolisthesis at L4-L5.  Vertebrae: Vertebral body heights are maintained. Minor degenerative marrow edema together with irregularity and Schmorl's nodes at the L5 inferior endplate. Otherwise no significant marrow edema. Probable small vertebral body hemangioma the right at L3. No suspicious osseous lesion.  Conus medullaris and cauda equina: Conus extends to the L1-L2 level. Conus and cauda equina appear normal.  Paraspinal and other soft tissues: Unremarkable.  Disc levels:  L1-L2:  No canal or foraminal stenosis.  L2-L3:  No canal or foraminal stenosis.  L3-L4:  No canal or foraminal  stenosis.  L4-L5: Anterolisthesis with uncovering of disc bulge and superimposed right foraminal protrusion. Marked facet arthropathy with ligamentum flavum infolding. Marked canal stenosis with effacement of the right greater than left subarticular recesses. Marked right foraminal stenosis with compression of the exiting L4 nerve root. Minor left foraminal stenosis.  L5-S1: Disc bulge with endplate osteophytic ridging and superimposed left foraminal protrusion. Mild right and marked left facet arthropathy with ligamentum flavum infolding. Mild canal stenosis. Partial effacement of the left lateral recess. Mild right and moderate left foraminal stenosis.  IMPRESSION: Lower lumbar degenerative changes as detailed above. Most notably, there is significant canal, subarticular recess, and right foraminal stenosis at L4-L5.   Electronically Signed By: Macy Mis M.D. On: 08/09/2020 13:43  Complexity Note: Imaging results reviewed. Results shared with Mr. Haynie, using Layman's terms.                         ROS  Cardiovascular: Chest pain and Heart attack ( Date: 05/2020) Pulmonary or Respiratory: Lung problems, Difficulty blowing air out (Emphysema), Smoking and Snoring  Neurological: No reported neurological signs or symptoms such as seizures, abnormal skin sensations, urinary and/or fecal incontinence, being born with an abnormal open spine and/or a tethered spinal cord Psychological-Psychiatric: No reported psychological or psychiatric signs or symptoms such as difficulty sleeping, anxiety, depression, delusions or hallucinations (schizophrenial), mood swings (bipolar disorders) or suicidal ideations or attempts Gastrointestinal: No reported gastrointestinal signs or symptoms such as vomiting or evacuating blood, reflux, heartburn, alternating episodes of diarrhea and constipation, inflamed or scarred liver, or pancreas or irrregular and/or infrequent bowel  movements Genitourinary: Passing kidney stones Hematological: No reported hematological signs or symptoms such as prolonged bleeding, low or poor functioning platelets, bruising or bleeding easily, hereditary bleeding problems, low energy levels due to low hemoglobin or being anemic Endocrine: High blood sugar requiring insulin (IDDM) Rheumatologic: No reported rheumatological signs and symptoms such as fatigue, joint pain, tenderness, swelling, redness, heat, stiffness, decreased range of motion, with or without associated rash Musculoskeletal: Negative for myasthenia gravis, muscular dystrophy, multiple sclerosis or malignant hyperthermia Work History: Disabled  Allergies  Mr. Antillon is allergic to morphine and related.  Laboratory Chemistry Profile   Renal Lab Results  Component Value Date   BUN 21 09/03/2020   CREATININE 0.94 09/03/2020   GFRAA >60 09/03/2020   GFRNONAA >60 09/03/2020   SPECGRAV <1.005 (L) 10/04/2017   PHUR 5.5 10/04/2017   PROTEINUR NEGATIVE 07/29/2018     Electrolytes Lab Results  Component Value Date   NA 138 09/03/2020   K 4.3 09/03/2020   CL 106 09/03/2020   CALCIUM 9.2 09/03/2020   MG 2.4 05/28/2013     Hepatic Lab Results  Component Value Date   AST 19 09/02/2020   ALT 18 09/02/2020   ALBUMIN 3.8 09/02/2020   ALKPHOS  63 09/02/2020     ID Lab Results  Component Value Date   HIV Non Reactive 09/03/2020   [place] NEGATIVE 09/02/2020     Bone No results found for: VD25OH, DP824MP5TIR, WE3154MG8, QP6195KD3, 25OHVITD1, 25OHVITD2, 25OHVITD3, TESTOFREE, TESTOSTERONE   Endocrine Lab Results  Component Value Date   GLUCOSE 180 (H) 09/03/2020   GLUCOSEU NEGATIVE 07/29/2018   HGBA1C 6.4 (H) 09/03/2020     Neuropathy Lab Results  Component Value Date   HGBA1C 6.4 (H) 09/03/2020   HIV Non Reactive 09/03/2020     CNS No results found for: COLORCSF, APPEARCSF, RBCCOUNTCSF, WBCCSF, POLYSCSF, LYMPHSCSF, EOSCSF, PROTEINCSF, GLUCCSF,  JCVIRUS, CSFOLI, IGGCSF, LABACHR, ACETBL, LABACHR, ACETBL   Inflammation (CRP: Acute  ESR: Chronic) Lab Results  Component Value Date   LATICACIDVEN 3.1 ([place]) 07/30/2018     Rheumatology No results found for: RF, ANA, LABURIC, URICUR, LYMEIGGIGMAB, LYMEABIGMQN, HLAB27   Coagulation Lab Results  Component Value Date   PLT 382 09/03/2020     Cardiovascular Lab Results  Component Value Date   BNP 17.2 07/25/2018   CKTOTAL 236 (H) 05/25/2013   CKMB 2.0 05/25/2013   TROPONINI <0.03 07/29/2018   HGB 14.8 09/03/2020   HCT 44.4 09/03/2020     Screening Lab Results  Component Value Date   SARSCOV2NAA NEGATIVE 09/02/2020   HIV Non Reactive 09/03/2020     Cancer No results found for: CEA, CA125, LABCA2   Allergens No results found for: ALMOND, APPLE, ASPARAGUS, AVOCADO, BANANA, BARLEY, BASIL, BAYLE[place], GREENBEAN, LIMABEAN, WHITEBEAN, BEEFIGE, REDBEET, BLUEBERRY, BROCCOLI, CABBAGE, MELON, CARROT, CASEIN, [place], CAULIFLOWER, CELERY     Note: Lab results reviewed.  [place]  Drug: Mr. Mundie  reports no history of drug use. Alcohol:  reports current alcohol use. Tobacco:  reports that he has been smoking cigarettes. He has been smoking about 0.50 packs per day. He has never used smokeless tobacco. Medical:  has a past medical history of Acute on chronic respiratory failure with hypoxia ([place]) (05/25/2017), Acute respiratory failure with hypoxia ([facility]) (05/25/2017), COPD (chronic obstructive pulmonary disease) ([place]), COPD with acute bronchitis ([place]) (05/25/2017), COPD with acute exacerbation ([place]) (05/25/2017), Diabetes mellitus without complication ([place]), Leukocytosis (05/25/2017), and Prostate cancer ([place]). Family: family history includes Breast cancer in his mother; Stroke in his mother.  Past Surgical History:  Procedure Laterality Date  . TESTICLE SURGERY     undecended testicle repair   Active Ambulatory Problems    Diagnosis Date Noted  . Acute on chronic respiratory failure  with hypoxia ([place]) 05/25/2017  . COPD with acute exacerbation ([place]) 05/25/2017  . COPD with acute bronchitis ([place]) 05/25/2017  . Leukocytosis 05/25/2017  . Elevated PSA 11/01/2017  . Benign prostatic hyperplasia with lower urinary tract symptoms 11/01/2017  . Prostate cancer ([place]) 12/27/2017  . COPD exacerbation ([place]) 04/29/2020  . Type 2 diabetes mellitus ([place]) 09/02/2020  . Lumbar spondylosis 09/08/2020  . Spinal stenosis, lumbar region, 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/08/2020  . Chronic radicular lumbar pain 09/08/2020  . Lumbar degenerative disc disease 09/08/2020  . Chronic pain syndrome 09/08/2020   Resolved Ambulatory Problems    Diagnosis Date Noted  . Acute respiratory failure with hypoxia ([place]) 05/25/2017  . Acute respiratory failure ([place]) 07/29/2018  . COPD with exacerbation ([place]) 09/02/2020   Past Medical History:  Diagnosis Date  . COPD (chronic obstructive pulmonary disease) ([place])   . Diabetes mellitus without complication ([place])    Constitutional Exam  General appearance: Well nourished, well developed, and well hydrated. In no apparent acute distress  Vitals:   09/08/20 1302 09/08/20 1303  BP: (!) 144/88   Pulse: (!) 104   Resp: (!) 24   Temp:  99.3 F (37.4 C)  SpO2: 97%   Weight: 240 lb (108.9 kg)   Height: _0  (1.905 m)    BMI Assessment: Estimated body mass index is 30 kg/m as calculated from the following:   Height as of this encounter: _1  (1.905 m).   Weight as of this encounter: 240 lb (108.9 kg).  BMI interpretation table: BMI level Category Range association with higher incidence of chronic pain  <18 kg/m2 Underweight   18.5-24.9 kg/m2 Ideal body weight   25-29.9 kg/m2 Overweight Increased incidence by 20%  30-34.9 kg/m2 Obese (Class I) Increased incidence by 68%  35-39.9 kg/m2 Severe obesity (Class II) Increased incidence by 136%  >40 kg/m2 Extreme obesity (Class III) Increased incidence by 254%   Patient's current BMI Ideal Body  weight  Body mass index is 30 kg/m. Ideal body weight: 84.5 kg (186 lb 4.6 oz) Adjusted ideal body weight: 94.2 kg (207 lb 12.4 oz)   BMI Readings from Last 4 Encounters:  09/08/20 30.00 kg/m  09/03/20 29.70 kg/m  06/17/20 30.17 kg/m  04/29/20 27.39 kg/m   Wt Readings from Last 4 Encounters:  09/08/20 240 lb (108.9 kg)  09/03/20 237 lb 9.6 oz (107.8 kg)  06/17/20 241 lb 6.4 oz (109.5 kg)  04/29/20 225 lb (102.1 kg)    Psych/Mental status: Alert, oriented x 3 (person, place, & time)       Eyes: PERLA Respiratory: Oxygen-dependent COPD, 3 L, increased work of breathing, occasional cough  Cervical Spine Exam  Skin & Axial Inspection: No masses, redness, edema, swelling, or associated skin lesions Alignment: Symmetrical Functional ROM: Pain restricted ROM      Stability: No instability detected Muscle Tone/Strength: Functionally intact. No obvious neuro-muscular anomalies detected. Sensory (Neurological): Musculoskeletal pain pattern Palpation: Complains of area being tender to palpation              Upper Extremity (UE) Exam    Side: Right upper extremity  Side: Left upper extremity  Skin & Extremity Inspection: Skin color, temperature, and hair growth are WNL. No peripheral edema or cyanosis. No masses, redness, swelling, asymmetry, or associated skin lesions. No contractures.  Skin & Extremity Inspection: Skin color, temperature, and hair growth are WNL. No peripheral edema or cyanosis. No masses, redness, swelling, asymmetry, or associated skin lesions. No contractures.  Functional ROM: Unrestricted ROM          Functional ROM: Unrestricted ROM          Muscle Tone/Strength: Functionally intact. No obvious neuro-muscular anomalies detected.   Muscle Tone/Strength: Functionally intact. No obvious neuro-muscular anomalies detected.  Sensory (Neurological): Neurogenic pain pattern          Sensory (Neurological): Neurogenic pain pattern          Palpation: No palpable anomalies               Palpation: No palpable anomalies              Provocative Test(s):  Phalen's test: deferred Tinel's test: deferred Apley's scratch test (touch opposite shoulder):  Action 1 (Across chest): deferred Action 2 (Overhead): deferred Action 3 (LB reach): deferred   Provocative Test(s):  Phalen's test: deferred Tinel's test: deferred Apley's scratch test (touch opposite shoulder):  Action 1 (Across chest): deferred Action 2 (Overhead): deferred Action 3 (LB reach): deferred    Thoracic Spine  Area Exam  Skin & Axial Inspection: No masses, redness, or swelling Alignment: Symmetrical Functional ROM: Unrestricted ROM Stability: No instability detected Muscle Tone/Strength: Functionally intact. No obvious neuro-muscular anomalies detected. Sensory (Neurological): Unimpaired Muscle strength & Tone: No palpable anomalies  Lumbar Exam  Skin & Axial Inspection: No masses, redness, or swelling Alignment: Symmetrical Functional ROM: Pain restricted ROM affecting both sides Stability: No instability detected Muscle Tone/Strength: Functionally intact. No obvious neuro-muscular anomalies detected. Sensory (Neurological): Musculoskeletal pain pattern Palpation: No palpable anomalies       Provocative Tests: Hyperextension/rotation test: (+) bilaterally for facet joint pain. Lumbar quadrant test (Kemp's test): (+) bilaterally for facet joint pain. Lateral bending test: (+) due to pain. Patrick's Maneuver: (+) due to pain             FABER* test: deferred today                   S-I anterior distraction/compression test: deferred today         S-I lateral compression test: deferred today         S-I Thigh-thrust test: deferred today         S-I Gaenslen's test: deferred today         *(Flexion, ABduction and External Rotation)  Gait & Posture Assessment  Ambulation: Patient came in today in a wheel chair Gait: Antalgic gait (limping) Posture: Difficulty standing up straight, due  to pain   Lower Extremity Exam    Side: Right lower extremity  Side: Left lower extremity  Stability: No instability observed          Stability: No instability observed          Skin & Extremity Inspection: Skin color, temperature, and hair growth are WNL. No peripheral edema or cyanosis. No masses, redness, swelling, asymmetry, or associated skin lesions. No contractures.  Skin & Extremity Inspection: Skin color, temperature, and hair growth are WNL. No peripheral edema or cyanosis. No masses, redness, swelling, asymmetry, or associated skin lesions. No contractures.  Functional ROM: Pain restricted ROM for hip and knee joints          Functional ROM: Unrestricted ROM                  Muscle Tone/Strength: Functionally intact. No obvious neuro-muscular anomalies detected.  Muscle Tone/Strength: Functionally intact. No obvious neuro-muscular anomalies detected.  Sensory (Neurological): Neurogenic pain pattern and musculoskeletal        Sensory (Neurological): Unimpaired        DTR: Patellar: deferred today Achilles: deferred today Plantar: deferred today  DTR: Patellar: deferred today Achilles: deferred today Plantar: deferred today  Palpation: No palpable anomalies  Palpation: No palpable anomalies   Assessment  Primary Diagnosis & Pertinent Problem List: The primary encounter diagnosis was Lumbar facet arthropathy. Diagnoses of Lumbar spondylosis, Spinal stenosis, lumbar region, with neurogenic claudication, Chronic radicular lumbar pain, Lumbar degenerative disc disease, COPD exacerbation ([place]), and Chronic pain syndrome were also pertinent to this visit.  Visit Diagnosis (New problems to examiner): 1. Lumbar facet arthropathy   2. Lumbar spondylosis   3. Spinal stenosis, lumbar region, with neurogenic claudication   4. Chronic radicular lumbar pain   5. Lumbar degenerative disc disease   6. COPD exacerbation ([place])   7. Chronic pain syndrome    General Recommendations: The pain  condition that the patient suffers from is best treated with a multidisciplinary approach that involves an increase in physical activity to prevent de-conditioning and worsening of  the pain cycle, as well as psychological counseling (formal and/or informal) to address the co-morbid psychological affects of pain. Treatment will often involve judicious use of pain medications and interventional procedures to decrease the pain, allowing the patient to participate in the physical activity that will ultimately produce long-lasting pain reductions. The goal of the multidisciplinary approach is to return the patient to a higher level of overall function and to restore their ability to perform activities of daily living.  Karry Causer has a history of greater than 3 months of moderate to severe pain which is resulted in functional impairment.  The patient has tried various conservative therapeutic options such as NSAIDs, Tylenol, muscle relaxants, physical therapy which was inadequately effective.  Patient's pain is predominantly axial with physical exam and L-MRI findings suggestive of facet arthropathy. Lumbar facet medial branch nerve blocks were discussed with the patient.  Risks and benefits were reviewed.  Patient would like to proceed with bilateral L3, L4, L5 medial branch nerve block.  Start gabapentin as below.  Titration instructions provided  Referral to physical therapy as below.   Plan of Care (Initial workup plan)   Lab Orders     Compliance Drug Analysis, Ur  Referral Orders     Ambulatory referral to Physical Therapy  Procedure Orders     LUMBAR FACET(MEDIAL BRANCH NERVE BLOCK) MBNB Pharmacotherapy (current): Medications ordered:  Meds ordered this encounter  Medications  . gabapentin (NEURONTIN) 100 MG capsule    Sig: Take 1 capsule (100 mg total) by mouth at bedtime for 15 days, THEN 2 capsules (200 mg total) at bedtime for 15 days, THEN 3 capsules (300 mg total) at bedtime.     Dispense:  135 capsule    Refill:  0   Medications administered during this visit: Wylie Hail had no medications administered during this visit.   Pharmacological management options:  Opioid Analgesics: The patient was informed that there is no guarantee that he would be a candidate for opioid analgesics. The decision will be made following CDC guidelines. This decision will be based on the results of diagnostic studies, as well as Mr. Isakson risk profile.   Membrane stabilizer: Trial of gabapentin as above.  Can consider Lyrica,Cymbalta in future  Muscle relaxant: To be determined at a later time  NSAID: To be determined at a later time  Other analgesic(s): To be determined at a later time   Interventional management options: Mr. Duris was informed that there is no guarantee that he would be a candidate for interventional therapies. The decision will be based on the results of diagnostic studies, as well as Mr. Eatherly risk profile.  Procedure(s) under consideration:  Lumbar facet medial branch nerve block Repeat lumbar epidural steroid injection Bilateral SI joint injection   Provider-requested follow-up: Return in about 1 week (around 09/15/2020) for B/L L3, 4, 5 Fcts #1 , with sedation.  Future Appointments  Date Time Provider [place]  10/06/2020  3:00 PM Gillis Santa, MD [facility] None  12/22/2020  2:15 PM Hollice Espy, MD [facility] None    Note by: Gillis Santa, MD Date: 09/08/2020; Time: 2:16 PM

## 2020-09-11 LAB — COMPLIANCE DRUG ANALYSIS, UR

## 2020-09-14 ENCOUNTER — Other Ambulatory Visit: Payer: Self-pay

## 2020-09-14 ENCOUNTER — Encounter: Payer: Self-pay | Admitting: Student in an Organized Health Care Education/Training Program

## 2020-09-14 ENCOUNTER — Ambulatory Visit
Admission: RE | Admit: 2020-09-14 | Discharge: 2020-09-14 | Disposition: A | Discharge status: Home / Self Care | Payer: Medicare HMO | Source: Ambulatory Visit | Attending: Student in an Organized Health Care Education/Training Program | Admitting: Student in an Organized Health Care Education/Training Program

## 2020-09-14 ENCOUNTER — Ambulatory Visit (HOSPITAL_BASED_OUTPATIENT_CLINIC_OR_DEPARTMENT_OTHER): Payer: Medicare HMO | Admitting: Student in an Organized Health Care Education/Training Program

## 2020-09-14 DIAGNOSIS — M47816 Spondylosis without myelopathy or radiculopathy, lumbar region: Secondary | ICD-10-CM | POA: Insufficient documentation

## 2020-09-14 DIAGNOSIS — Z79899 Other long term (current) drug therapy: Secondary | ICD-10-CM | POA: Insufficient documentation

## 2020-09-14 DIAGNOSIS — G894 Chronic pain syndrome: Secondary | ICD-10-CM

## 2020-09-14 DIAGNOSIS — M5136 Other intervertebral disc degeneration, lumbar region: Secondary | ICD-10-CM | POA: Diagnosis present

## 2020-09-14 LAB — GLUCOSE, CAPILLARY: Glucose-Capillary: 116 mg/dL — ABNORMAL HIGH (ref 70–99)

## 2020-09-14 MED ORDER — ROPIVACAINE HCL 2 MG/ML IJ SOLN
9.0000 mL | Freq: Once | INTRAMUSCULAR | Status: AC
  Administered 2020-09-14: 9 mL via PERINEURAL

## 2020-09-14 MED ORDER — LIDOCAINE HCL 2 % IJ SOLN
20.0000 mL | Freq: Once | INTRAMUSCULAR | Status: AC
  Administered 2020-09-14: 400 mg

## 2020-09-14 MED ORDER — LIDOCAINE HCL 2 % IJ SOLN
INTRAMUSCULAR | Status: AC
  Filled 2020-09-14: qty 20

## 2020-09-14 MED ORDER — DEXAMETHASONE SODIUM PHOSPHATE 10 MG/ML IJ SOLN
10.0000 mg | Freq: Once | INTRAMUSCULAR | Status: AC
  Administered 2020-09-14: 10 mg

## 2020-09-14 MED ORDER — DEXAMETHASONE SODIUM PHOSPHATE 10 MG/ML IJ SOLN
INTRAMUSCULAR | Status: AC
  Filled 2020-09-14: qty 2

## 2020-09-14 MED ORDER — ROPIVACAINE HCL 2 MG/ML IJ SOLN
INTRAMUSCULAR | Status: AC
  Filled 2020-09-14: qty 20

## 2020-09-14 NOTE — Progress Notes (Signed)
PROVIDER NOTE: Information contained herein reflects review and annotations entered in association with encounter. Interpretation of such information and data should be left to medically-trained personnel. Information provided to patient can be located elsewhere in the medical record under "Patient Instructions". Document created using STT-dictation technology, any transcriptional errors that may result from process are unintentional.    Patient: Ernest [name]  Service Category: Procedure  Provider: Gillis Santa, MD  DOB: November 03, 1956  DOS: 09/14/2020  Location: [facility]  MRN: [id number]  Setting: Ambulatory - outpatient  Referring Provider: Gillis Santa, MD  Type: Established Patient  Specialty: Interventional Pain Management  PCP: Tracie Harrier, MD   Primary Reason for Visit: Interventional Pain Management Treatment. CC: Back Pain  Procedure:          Anesthesia, Analgesia, Anxiolysis:  Type: Lumbar Facet, Medial Branch Block(s) #1  Primary Purpose: Diagnostic Region: Posterolateral Lumbosacral Spine Level: L3, L4, L5,  Medial Branch Level(s). Injecting these levels blocks the L3-4, L4-5, and L5-S1 lumbar facet joints. Laterality: Bilateral  Type: Local Anesthesia  Local Anesthetic: Lidocaine 1-2%  Position: Prone   Indications: 1. Lumbar facet arthropathy   2. Lumbar spondylosis   3. Lumbar degenerative disc disease   4. Chronic pain syndrome    Pain Score: Pre-procedure: 8 /10 Post-procedure: 3 /10   Pre-op Assessment:  Ernest [name] is a 64 y.o. (year old), male patient, seen today for interventional treatment. He  has a past surgical history that includes Testicle surgery. Ernest [name] has a current medication list which includes the following prescription(s): albuterol, budesonide, budesonide-formoterol, dextromethorphan-guaifenesin, finasteride, gabapentin, hydrocodone-acetaminophen, ipratropium-albuterol, meloxicam, metformin, sertraline, sildenafil,  azithromycin, and trelegy ellipta. His primarily concern today is the Back Pain  Initial Vital Signs:  Pulse/HCG Rate: 82ECG Heart Rate: 60 Temp: (!) 97.3 F (36.3 C) Resp: 15 BP: (!) 120/96 SpO2: 97 % (3 liters)  BMI: Estimated body mass index is 30 kg/m as calculated from the following:   Height as of this encounter: 6\' 3"  (1.905 m).   Weight as of this encounter: 240 lb (108.9 kg).  Risk Assessment: Allergies: Reviewed. He is allergic to morphine and related.  Allergy Precautions: None required Coagulopathies: Reviewed. None identified.  Blood-thinner therapy: None at this time Active Infection(s): Reviewed. None identified. Ernest [name] is afebrile  Site Confirmation: [name] was asked to confirm the procedure and laterality before marking the site Procedure checklist: Completed Consent: Before the procedure and under the influence of no sedative(s), amnesic(s), or anxiolytics, the patient was informed of the treatment options, risks and possible complications. To fulfill our ethical and legal obligations, as recommended by the American Medical Association's Code of Ethics, I have informed the patient of my clinical impression; the nature and purpose of the treatment or procedure; the risks, benefits, and possible complications of the intervention; the alternatives, including doing nothing; the risk(s) and benefit(s) of the alternative treatment(s) or procedure(s); and the risk(s) and benefit(s) of doing nothing. The patient was provided information about the general risks and possible complications associated with the procedure. These may include, but are not limited to: failure to achieve desired goals, infection, bleeding, organ or nerve damage, allergic reactions, paralysis, and death. In addition, the patient was informed of those risks and complications associated to Spine-related procedures, such as failure to decrease pain; infection (i.e.: Meningitis, epidural or intraspinal  abscess); bleeding (i.e.: epidural hematoma, subarachnoid hemorrhage, or any other type of intraspinal or peri-dural bleeding); organ or nerve damage (i.e.: Any type of peripheral nerve, nerve  root, or spinal cord injury) with subsequent damage to sensory, motor, and/or autonomic systems, resulting in permanent pain, numbness, and/or weakness of one or several areas of the body; allergic reactions; (i.e.: anaphylactic reaction); and/or death. Furthermore, the patient was informed of those risks and complications associated with the medications. These include, but are not limited to: allergic reactions (i.e.: anaphylactic or anaphylactoid reaction(s)); adrenal axis suppression; blood sugar elevation that in diabetics may result in ketoacidosis or comma; water retention that in patients with history of congestive heart failure may result in shortness of breath, pulmonary edema, and decompensation with resultant heart failure; weight gain; swelling or edema; medication-induced neural toxicity; particulate matter embolism and blood vessel occlusion with resultant organ, and/or nervous system infarction; and/or aseptic necrosis of one or more joints. Finally, the patient was informed that Medicine is not an exact science; therefore, there is also the possibility of unforeseen or unpredictable risks and/or possible complications that may result in a catastrophic outcome. The patient indicated having understood very clearly. We have given the patient no guarantees and we have made no promises. Enough time was given to the patient to ask questions, all of which were answered to the patient's satisfaction. Ernest [name] has indicated that he wanted to continue with the procedure. Attestation: I, the ordering provider, attest that I have discussed with the patient the benefits, risks, side-effects, alternatives, likelihood of achieving goals, and potential problems during recovery for the procedure that I have provided  informed consent. Date  Time: 09/14/2020 10:06 AM  Pre-Procedure Preparation:  Monitoring: As per clinic protocol. Respiration, ETCO2, SpO2, BP, heart rate and rhythm monitor placed and checked for adequate function Safety Precautions: Patient was assessed for positional comfort and pressure points before starting the procedure. Time-out: I initiated and conducted the "Time-out" before starting the procedure, as per protocol. The patient was asked to participate by confirming the accuracy of the "Time Out" information. Verification of the correct person, site, and procedure were performed and confirmed by me, the nursing staff, and the patient. "Time-out" conducted as per Joint Commission's Universal Protocol (UP.01.01.01). Time: 1122  Description of Procedure:          Laterality: Bilateral. The procedure was performed in identical fashion on both sides. Levels: L3, L4, L5,Medial Branch Level(s) Area Prepped: Posterior Lumbosacral Region DuraPrep (Iodine Povacrylex [0.7% available iodine] and Isopropyl Alcohol, 74% w/w) Safety Precautions: Aspiration looking for blood return was conducted prior to all injections. At no point did we inject any substances, as a needle was being advanced. Before injecting, the patient was told to immediately notify me if he was experiencing any new onset of "ringing in the ears, or metallic taste in the mouth". No attempts were made at seeking any paresthesias. Safe injection practices and needle disposal techniques used. Medications properly checked for expiration dates. SDV (single dose vial) medications used. After the completion of the procedure, all disposable equipment used was discarded in the proper designated medical waste containers. Local Anesthesia: Protocol guidelines were followed. The patient was positioned over the fluoroscopy table. The area was prepped in the usual manner. The time-out was completed. The target area was identified using fluoroscopy. A  12-in long, straight, sterile hemostat was used with fluoroscopic guidance to locate the targets for each level blocked. Once located, the skin was marked with an approved surgical skin marker. Once all sites were marked, the skin (epidermis, dermis, and hypodermis), as well as deeper tissues (fat, connective tissue and muscle) were infiltrated with a small  amount of a short-acting local anesthetic, loaded on a 10cc syringe with a 25G, 1.5-in  Needle. An appropriate amount of time was allowed for local anesthetics to take effect before proceeding to the next step. Local Anesthetic: Lidocaine 2.0% The unused portion of the local anesthetic was discarded in the proper designated containers. Technical explanation of process:  L3 Medial Branch Nerve Block (MBB): The target area for the L3 medial branch is at the junction of the postero-lateral aspect of the superior articular process and the superior, posterior, and medial edge of the transverse process of L4. Under fluoroscopic guidance, a Quincke needle was inserted until contact was made with os over the superior postero-lateral aspect of the pedicular shadow (target area). After negative aspiration for blood, 2 mL of the nerve block solution was injected without difficulty or complication. The needle was removed intact. L4 Medial Branch Nerve Block (MBB): The target area for the L4 medial branch is at the junction of the postero-lateral aspect of the superior articular process and the superior, posterior, and medial edge of the transverse process of L5. Under fluoroscopic guidance, a Quincke needle was inserted until contact was made with os over the superior postero-lateral aspect of the pedicular shadow (target area). After negative aspiration for blood, 2 mL of the nerve block solution was injected without difficulty or complication. The needle was removed intact. L5 Medial Branch Nerve Block (MBB): The target area for the L5 medial branch is at the junction  of the postero-lateral aspect of the superior articular process and the superior, posterior, and medial edge of the sacral ala. Under fluoroscopic guidance, a Quincke needle was inserted until contact was made with os over the superior postero-lateral aspect of the pedicular shadow (target area). After negative aspiration for blood, 2 mL of the nerve block solution was injected without difficulty or complication. The needle was removed intact.   Nerve block solution: 12 cc solution made of 10 cc of 0.2% ropivacaine, 2 cc of Decadron 10 mg/cc.  2 cc injected at each level above bilaterally.  The unused portion of the solution was discarded in the proper designated containers. Procedural Needles: 25-gauge, 3.5-inch, Quincke needles used for all levels.  Once the entire procedure was completed, the treated area was cleaned, making sure to leave some of the prepping solution back to take advantage of its long term bactericidal properties.   Illustration of the posterior view of the lumbar spine and the posterior neural structures. Laminae of L2 through S1 are labeled. DPRL5, dorsal primary ramus of L5; DPRS1, dorsal primary ramus of S1; DPR3, dorsal primary ramus of L3; FJ, facet (zygapophyseal) joint L3-L4; I, inferior articular process of L4; LB1, lateral branch of dorsal primary ramus of L1; IAB, inferior articular branches from L3 medial branch (supplies L4-L5 facet joint); IBP, intermediate branch plexus; MB3, medial branch of dorsal primary ramus of L3; NR3, third lumbar nerve root; S, superior articular process of L5; SAB, superior articular branches from L4 (supplies L4-5 facet joint also); TP3, transverse process of L3.  Vitals:   09/14/20 1118 09/14/20 1123 09/14/20 1129 09/14/20 1134  BP: (!) 134/101 (!) 134/101 (!) 138/101 (!) 137/99  Pulse:      Resp: 15 18 20 18   Temp:      SpO2: 100% 100% 100% 100%  Weight:      Height:         Start Time: 1122 hrs. End Time: 1133 hrs.  Imaging  Guidance (Spinal):  Type of Imaging Technique: Fluoroscopy Guidance (Spinal) Indication(s): Assistance in needle guidance and placement for procedures requiring needle placement in or near specific anatomical locations not easily accessible without such assistance. Exposure Time: Please see nurses notes. Contrast: None used. Fluoroscopic Guidance: I was personally present during the use of fluoroscopy. "Tunnel Vision Technique" used to obtain the best possible view of the target area. Parallax error corrected before commencing the procedure. "Direction-depth-direction" technique used to introduce the needle under continuous pulsed fluoroscopy. Once target was reached, antero-posterior, oblique, and lateral fluoroscopic projection used confirm needle placement in all planes. Images permanently stored in EMR. Interpretation: No contrast injected. I personally interpreted the imaging intraoperatively. Adequate needle placement confirmed in multiple planes. Permanent images saved into the patient's record.  Antibiotic Prophylaxis:   Anti-infectives (From admission, onward)   None     Indication(s): None identified  Post-operative Assessment:  Post-procedure Vital Signs:  Pulse/HCG Rate: 8260 Temp: (!) 97.3 F (36.3 C) Resp: 18 BP: (!) 137/99 SpO2: 100 %  EBL: None  Complications: No immediate post-treatment complications observed by team, or reported by patient.  Note: The patient tolerated the entire procedure well. A repeat set of vitals were taken after the procedure and the patient was kept under observation following institutional policy, for this type of procedure. Post-procedural neurological assessment was performed, showing return to baseline, prior to discharge. The patient was provided with post-procedure discharge instructions, including a section on how to identify potential problems. Should any problems arise concerning this procedure, the patient was given instructions  to immediately contact us, at any time, without hesitation. In any case, we plan to contact the patient by telephone for a follow-up status report regarding this interventional procedure.  Comments:  No additional relevant information.  Plan of Care  Orders:  Orders Placed This Encounter  Procedures  . [facility] C-ARM 1-60 MIN NO REPORT    Intraoperative interpretation by procedural physician at [facility].    Standing Status:   Standing    Number of Occurrences:   1    Order Specific Question:   Reason for exam:    Answer:   Assistance in needle guidance and placement for procedures requiring needle placement in or near specific anatomical locations not easily accessible without such assistance.  . Glucose, capillary   Medications ordered for procedure: Meds ordered this encounter  Medications  . lidocaine (XYLOCAINE) 2 % (with pres) injection 400 mg  . dexamethasone (DECADRON) injection 10 mg  . dexamethasone (DECADRON) injection 10 mg  . ropivacaine (PF) 2 mg/mL (0.2%) (NAROPIN) injection 9 mL  . ropivacaine (PF) 2 mg/mL (0.2%) (NAROPIN) injection 9 mL   Medications administered: We administered lidocaine, dexamethasone, dexamethasone, ropivacaine (PF) 2 mg/mL (0.2%), and ropivacaine (PF) 2 mg/mL (0.2%).  See the medical record for exact dosing, route, and time of administration.  Follow-up plan:   Return in about 4 weeks (around 10/12/2020) for Post Procedure Evaluation, virtual.    Recent Visits Date Type Provider Dept  09/08/20 Office Visit Gillis Santa, MD [facility]  Showing recent visits within past 90 days and meeting all other requirements Today's Visits Date Type Provider Dept  09/14/20 Procedure visit Gillis Santa, MD [facility]  Showing today's visits and meeting all other requirements Future Appointments Date Type Provider Dept  10/06/20 Appointment Gillis Santa, MD [facility]  Showing future appointments  within next 90 days and meeting all other requirements  Disposition: Discharge home  Discharge (Date  Time): 09/14/2020; 1145 hrs.   Primary Care Physician: Tracie Harrier, MD Location: [facility] Note by: Gillis Santa, MD Date: 09/14/2020; Time: 12:00 PM  Disclaimer:  Medicine is not an exact science. The only guarantee in medicine is that nothing is guaranteed. It is important to note that the decision to proceed with this intervention was based on the information collected from the patient. The Data and conclusions were drawn from the patient's questionnaire, the interview, and the physical examination. Because the information was provided in large part by the patient, it cannot be guaranteed that it has not been purposely or unconsciously manipulated. Every effort has been made to obtain as much relevant data as possible for this evaluation. It is important to note that the conclusions that lead to this procedure are derived in large part from the available data. Always take into account that the treatment will also be dependent on availability of resources and existing treatment guidelines, considered by other Pain Management Practitioners as being common knowledge and practice, at the time of the intervention. For Medico-Legal purposes, it is also important to point out that variation in procedural techniques and pharmacological choices are the acceptable norm. The indications, contraindications, technique, and results of the above procedure should only be interpreted and judged by a Board-Certified Interventional Pain Specialist with extensive familiarity and expertise in the same exact procedure and technique.

## 2020-09-14 NOTE — Progress Notes (Signed)
Safety precautions to be maintained throughout the outpatient stay will include: orient to surroundings, keep bed in low position, maintain call bell within reach at all times, provide assistance with transfer out of bed and ambulation.  

## 2020-09-14 NOTE — Patient Instructions (Signed)
GENERAL RISKS AND COMPLICATIONS  What are the risk, side effects and possible complications? Generally speaking, most procedures are safe.  However, with any procedure there are risks, side effects, and the possibility of complications.  The risks and complications are dependent upon the sites that are lesioned, or the type of nerve block to be performed.  The closer the procedure is to the spine, the more serious the risks are.  Great care is taken when placing the radio frequency needles, block needles or lesioning probes, but sometimes complications can occur. 1. Infection: Any time there is an injection through the skin, there is a risk of infection.  This is why sterile conditions are used for these blocks.  There are four possible types of infection. 1. Localized skin infection. 2. Central Nervous System Infection-This can be in the form of Meningitis, which can be deadly. 3. Epidural Infections-This can be in the form of an epidural abscess, which can cause pressure inside of the spine, causing compression of the spinal cord with subsequent paralysis. This would require an emergency surgery to decompress, and there are no guarantees that the patient would recover from the paralysis. 4. Discitis-This is an infection of the intervertebral discs.  It occurs in about 1% of discography procedures.  It is difficult to treat and it may lead to surgery.        2. Pain: the needles have to go through skin and soft tissues, will cause soreness.       3. Damage to internal structures:  The nerves to be lesioned may be near blood vessels or    other nerves which can be potentially damaged.       4. Bleeding: Bleeding is more common if the patient is taking blood thinners such as  aspirin, Coumadin, Ticiid, Plavix, etc., or if he/she have some genetic predisposition  such as hemophilia. Bleeding into the spinal canal can cause compression of the spinal  cord with subsequent paralysis.  This would require an  emergency surgery to  decompress and there are no guarantees that the patient would recover from the  paralysis.       5. Pneumothorax:  Puncturing of a lung is a possibility, every time a needle is introduced in  the area of the chest or upper back.  Pneumothorax refers to free air around the  collapsed lung(s), inside of the thoracic cavity (chest cavity).  Another two possible  complications related to a similar event would include: Hemothorax and Chylothorax.   These are variations of the Pneumothorax, where instead of air around the collapsed  lung(s), you may have blood or chyle, respectively.       6. Spinal headaches: They may occur with any procedures in the area of the spine.       7. Persistent CSF (Cerebro-Spinal Fluid) leakage: This is a rare problem, but may occur  with prolonged intrathecal or epidural catheters either due to the formation of a fistulous  track or a dural tear.       8. Nerve damage: By working so close to the spinal cord, there is always a possibility of  nerve damage, which could be as serious as a permanent spinal cord injury with  paralysis.       9. Death:  Although rare, severe deadly allergic reactions known as "Anaphylactic  reaction" can occur to any of the medications used.      10. Worsening of the symptoms:  We can always make thing worse.    What are the chances of something like this happening? Chances of any of this occuring are extremely low.  By statistics, you have more of a chance of getting killed in a motor vehicle accident: while driving to the hospital than any of the above occurring .  Nevertheless, you should be aware that they are possibilities.  In general, it is similar to taking a shower.  Everybody knows that you can slip, hit your head and get killed.  Does that mean that you should not shower again?  Nevertheless always keep in mind that statistics do not mean anything if you happen to be on the wrong side of them.  Even if a procedure has a 1  (one) in a 1,000,000 (million) chance of going wrong, it you happen to be that one..Also, keep in mind that by statistics, you have more of a chance of having something go wrong when taking medications.  Who should not have this procedure? If you are on a blood thinning medication (e.g. Coumadin, Plavix, see list of "Blood Thinners"), or if you have an active infection going on, you should not have the procedure.  If you are taking any blood thinners, please inform your physician.  How should I prepare for this procedure?  Do not eat or drink anything at least six hours prior to the procedure.  Bring a driver with you .  It cannot be a taxi.  Come accompanied by an adult that can drive you back, and that is strong enough to help you if your legs get weak or numb from the local anesthetic.  Take all of your medicines the morning of the procedure with just enough water to swallow them.  If you have diabetes, make sure that you are scheduled to have your procedure done first thing in the morning, whenever possible.  If you have diabetes, take only half of your insulin dose and notify our nurse that you have done so as soon as you arrive at the clinic.  If you are diabetic, but only take blood sugar pills (oral hypoglycemic), then do not take them on the morning of your procedure.  You may take them after you have had the procedure.  Do not take aspirin or any aspirin-containing medications, at least eleven (11) days prior to the procedure.  They may prolong bleeding.  Wear loose fitting clothing that may be easy to take off and that you would not mind if it got stained with Betadine or blood.  Do not wear any jewelry or perfume  Remove any nail coloring.  It will interfere with some of our monitoring equipment.  NOTE: Remember that this is not meant to be interpreted as a complete list of all possible complications.  Unforeseen problems may occur.  BLOOD THINNERS The following drugs  contain aspirin or other products, which can cause increased bleeding during surgery and should not be taken for 2 weeks prior to and 1 week after surgery.  If you should need take something for relief of minor pain, you may take acetaminophen which is found in Tylenol,m Datril, Anacin-3 and Panadol. It is not blood thinner. The products listed below are.  Do not take any of the products listed below in addition to any listed on your instruction sheet.  A.P.C or A.P.C with Codeine Codeine Phosphate Capsules #3 Ibuprofen Ridaura  ABC compound Congesprin Imuran rimadil  Advil Cope Indocin Robaxisal  Alka-Seltzer Effervescent Pain Reliever and Antacid Coricidin or Coricidin-D  Indomethacin Rufen    Alka-Seltzer plus Cold Medicine Cosprin Ketoprofen S-A-C Tablets  Anacin Analgesic Tablets or Capsules Coumadin Korlgesic Salflex  Anacin Extra Strength Analgesic tablets or capsules CP-2 Tablets Lanoril Salicylate  Anaprox Cuprimine Capsules Levenox Salocol  Anexsia-D Dalteparin Magan Salsalate  Anodynos Darvon compound Magnesium Salicylate Sine-off  Ansaid Dasin Capsules Magsal Sodium Salicylate  Anturane Depen Capsules Marnal Soma  APF Arthritis pain formula Dewitt's Pills Measurin Stanback  Argesic Dia-Gesic Meclofenamic Sulfinpyrazone  Arthritis Bayer Timed Release Aspirin Diclofenac Meclomen Sulindac  Arthritis pain formula Anacin Dicumarol Medipren Supac  Analgesic (Safety coated) Arthralgen Diffunasal Mefanamic Suprofen  Arthritis Strength Bufferin Dihydrocodeine Mepro Compound Suprol  Arthropan liquid Dopirydamole Methcarbomol with Aspirin Synalgos  ASA tablets/Enseals Disalcid Micrainin Tagament  Ascriptin Doan's Midol Talwin  Ascriptin A/D Dolene Mobidin Tanderil  Ascriptin Extra Strength Dolobid Moblgesic Ticlid  Ascriptin with Codeine Doloprin or Doloprin with Codeine Momentum Tolectin  Asperbuf Duoprin Mono-gesic Trendar  Aspergum Duradyne Motrin or Motrin IB Triminicin  Aspirin  plain, buffered or enteric coated Durasal Myochrisine Trigesic  Aspirin Suppositories Easprin Nalfon Trillsate  Aspirin with Codeine Ecotrin Regular or Extra Strength Naprosyn Uracel  Atromid-S Efficin Naproxen Ursinus  Auranofin Capsules Elmiron Neocylate Vanquish  Axotal Emagrin Norgesic Verin  Azathioprine Empirin or Empirin with Codeine Normiflo Vitamin E  Azolid Emprazil Nuprin Voltaren  Bayer Aspirin plain, buffered or children's or timed BC Tablets or powders Encaprin Orgaran Warfarin Sodium  Buff-a-Comp Enoxaparin Orudis Zorpin  Buff-a-Comp with Codeine Equegesic Os-Cal-Gesic   Buffaprin Excedrin plain, buffered or Extra Strength Oxalid   Bufferin Arthritis Strength Feldene Oxphenbutazone   Bufferin plain or Extra Strength Feldene Capsules Oxycodone with Aspirin   Bufferin with Codeine Fenoprofen Fenoprofen Pabalate or Pabalate-SF   Buffets II Flogesic Panagesic   Buffinol plain or Extra Strength Florinal or Florinal with Codeine Panwarfarin   Buf-Tabs Flurbiprofen Penicillamine   Butalbital Compound Four-way cold tablets Penicillin   Butazolidin Fragmin Pepto-Bismol   Carbenicillin Geminisyn Percodan   Carna Arthritis Reliever Geopen Persantine   Carprofen Gold's salt Persistin   Chloramphenicol Goody's Phenylbutazone   Chloromycetin Haltrain Piroxlcam   Clmetidine heparin Plaquenil   Cllnoril Hyco-pap Ponstel   Clofibrate Hydroxy chloroquine Propoxyphen         Before stopping any of these medications, be sure to consult the physician who ordered them.  Some, such as Coumadin (Warfarin) are ordered to prevent or treat serious conditions such as "deep thrombosis", "pumonary embolisms", and other heart problems.  The amount of time that you may need off of the medication may also vary with the medication and the reason for which you were taking it.  If you are taking any of these medications, please make sure you notify your pain physician before you undergo any  procedures.         Pain Management Discharge Instructions  General Discharge Instructions :  If you need to reach your doctor call: Monday-Friday 8:00 am - 4:00 pm at [phone] or toll free [phone].  After clinic hours [phone] to have operator reach doctor.  Bring all of your medication bottles to all your appointments in the pain clinic.  To cancel or reschedule your appointment with Pain Management please remember to call 24 hours in advance to avoid a fee.  Refer to the educational materials which you have been given on: General Risks, I had my Procedure. Discharge Instructions, Post Sedation.  Post Procedure Instructions:  The drugs you were given will stay in your system until tomorrow, so for the next 24 hours you should   not drive, make any legal decisions or drink any alcoholic beverages.  You may eat anything you prefer, but it is better to start with liquids then soups and crackers, and gradually work up to solid foods.  Please notify your doctor immediately if you have any unusual bleeding, trouble breathing or pain that is not related to your normal pain.  Depending on the type of procedure that was done, some parts of your body may feel week and/or numb.  This usually clears up by tonight or the next day.  Walk with the use of an assistive device or accompanied by an adult for the 24 hours.  You may use ice on the affected area for the first 24 hours.  Put ice in a Ziploc bag and cover with a towel and place against area 15 minutes on 15 minutes off.  You may switch to heat after 24 hours.Facet Blocks Patient Information  Description: The facets are joints in the spine between the vertebrae.  Like any joints in the body, facets can become irritated and painful.  Arthritis can also effect the facets.  By injecting steroids and local anesthetic in and around these joints, we can temporarily block the nerve supply to them.  Steroids act directly on  irritated nerves and tissues to reduce selling and inflammation which often leads to decreased pain.  Facet blocks may be done anywhere along the spine from the neck to the low back depending upon the location of your pain.   After numbing the skin with local anesthetic (like Novocaine), a small needle is passed onto the facet joints under x-ray guidance.  You may experience a sensation of pressure while this is being done.  The entire block usually lasts about 15-25 minutes.   Conditions which may be treated by facet blocks:   Low back/buttock pain  Neck/shoulder pain  Certain types of headaches  Preparation for the injection:  1. Do not eat any solid food or dairy products within 8 hours of your appointment. 2. You may drink clear liquid up to 3 hours before appointment.  Clear liquids include water, black coffee, juice or soda.  No milk or cream please. 3. You may take your regular medication, including pain medications, with a sip of water before your appointment.  Diabetics should hold regular insulin (if taken separately) and take 1/2 normal NPH dose the morning of the procedure.  Carry some sugar containing items with you to your appointment. 4. A driver must accompany you and be prepared to drive you home after your procedure. 5. Bring all your current medications with you. 6. An IV may be inserted and sedation may be given at the discretion of the physician. 7. A blood pressure cuff, EKG and other monitors will often be applied during the procedure.  Some patients may need to have extra oxygen administered for a short period. 8. You will be asked to provide medical information, including your allergies and medications, prior to the procedure.  We must know immediately if you are taking blood thinners (like Coumadin/Warfarin) or if you are allergic to IV iodine contrast (dye).  We must know if you could possible be pregnant.  Possible side-effects:   Bleeding from needle  site  Infection (rare, may require surgery)  Nerve injury (rare)  Numbness & tingling (temporary)  Difficulty urinating (rare, temporary)  Spinal headache (a headache worse with upright posture)  Light-headedness (temporary)  Pain at injection site (serveral days)  Decreased blood pressure (rare, temporary)    Weakness in arm/leg (temporary)  Pressure sensation in back/neck (temporary)   Call if you experience:   Fever/chills associated with headache or increased back/neck pain  Headache worsened by an upright position  New onset, weakness or numbness of an extremity below the injection site  Hives or difficulty breathing (go to the emergency room)  Inflammation or drainage at the injection site(s)  Severe back/neck pain greater than usual  New symptoms which are concerning to you  Please note:  Although the local anesthetic injected can often make your back or neck feel good for several hours after the injection, the pain will likely return. It takes 3-7 days for steroids to work.  You may not notice any pain relief for at least one week.  If effective, we will often do a series of 2-3 injections spaced 3-6 weeks apart to maximally decrease your pain.  After the initial series, you may be a candidate for a more permanent nerve block of the facets.  If you have any questions, please call [phone] [facility] 

## 2020-09-15 ENCOUNTER — Telehealth: Payer: Self-pay

## 2020-09-15 NOTE — Telephone Encounter (Signed)
Post procedure phone call.  LM 

## 2020-09-28 ENCOUNTER — Ambulatory Visit
Payer: Medicare HMO | Attending: Student in an Organized Health Care Education/Training Program | Admitting: Physical Therapy

## 2020-09-28 ENCOUNTER — Encounter: Payer: Self-pay | Admitting: Student in an Organized Health Care Education/Training Program

## 2020-09-29 ENCOUNTER — Encounter: Payer: Self-pay | Admitting: Student in an Organized Health Care Education/Training Program

## 2020-09-29 ENCOUNTER — Ambulatory Visit
Payer: Medicare HMO | Attending: Student in an Organized Health Care Education/Training Program | Admitting: Student in an Organized Health Care Education/Training Program

## 2020-09-29 ENCOUNTER — Other Ambulatory Visit: Payer: Self-pay

## 2020-09-29 DIAGNOSIS — G8929 Other chronic pain: Secondary | ICD-10-CM

## 2020-09-29 DIAGNOSIS — M5136 Other intervertebral disc degeneration, lumbar region: Secondary | ICD-10-CM

## 2020-09-29 DIAGNOSIS — M5416 Radiculopathy, lumbar region: Secondary | ICD-10-CM

## 2020-09-29 DIAGNOSIS — M47816 Spondylosis without myelopathy or radiculopathy, lumbar region: Secondary | ICD-10-CM

## 2020-09-29 DIAGNOSIS — M48062 Spinal stenosis, lumbar region with neurogenic claudication: Secondary | ICD-10-CM | POA: Diagnosis not present

## 2020-09-29 DIAGNOSIS — G894 Chronic pain syndrome: Secondary | ICD-10-CM

## 2020-09-29 DIAGNOSIS — M51369 Other intervertebral disc degeneration, lumbar region without mention of lumbar back pain or lower extremity pain: Secondary | ICD-10-CM

## 2020-09-29 NOTE — Progress Notes (Signed)
Patient: Ernest [name]  Service Category: E/M  Provider: Gillis Santa, MD  DOB: 08-12-1956  DOS: 09/29/2020  Location: Office  MRN: [id number]  Setting: Ambulatory outpatient  Referring Provider: Tracie Harrier, MD  Type: Established Patient  Specialty: Interventional Pain Management  PCP: Tracie Harrier, MD  Location: Home  Delivery: TeleHealth     Virtual Encounter - Pain Management PROVIDER NOTE: Information contained herein reflects review and annotations entered in association with encounter. Interpretation of such information and data should be left to medically-trained personnel. Information provided to patient can be located elsewhere in the medical record under "Patient Instructions". Document created using STT-dictation technology, any transcriptional errors that may result from process are unintentional.    Contact & Pharmacy Preferred: [phone] Home: [phone] (home) Mobile: [phone] (mobile) E-mail: [email or web address], [place] - [address] [place] [address] Phone: [phone] Fax: [phone]   Pre-screening  Mr. Gelpi offered "in-person" vs "virtual" encounter. He indicated preferring virtual for this encounter.   Reason COVID-19*  Social distancing based on CDC and AMA recommendations.   I contacted Wylie Hail on 09/29/2020 via video conference.      I clearly identified myself as Gillis Santa, MD. I verified that I was speaking with the correct person using two identifiers (Name: Gaelan Glennon, and date of birth: 1956/05/17).  Consent I sought verbal advanced consent from Wylie Hail for virtual visit interactions. I informed Mr. Ostrand of possible security and privacy concerns, risks, and limitations associated with providing "not-in-person" medical evaluation and management services. I also informed Mr. Feigel of the availability of "in-person" appointments. Finally, I informed him that there would be a  charge for the virtual visit and that he could be  personally, fully or partially, financially responsible for it. Mr. Hinton expressed understanding and agreed to proceed.   Historic Elements   Mr. Aldred Mase is a 64 y.o. year old, male patient evaluated today after our last contact on 09/14/2020. Mr. Spieker  has a past medical history of Acute on chronic respiratory failure with hypoxia ([place]) (05/25/2017), Acute respiratory failure with hypoxia ([place]) (05/25/2017), COPD (chronic obstructive pulmonary disease) ([place]), COPD with acute bronchitis ([place]) (05/25/2017), COPD with acute exacerbation ([place]) (05/25/2017), Diabetes mellitus without complication ([place]), Leukocytosis (05/25/2017), and Prostate cancer ([place]). He also  has a past surgical history that includes Testicle surgery. Mr. Eastridge has a current medication list which includes the following prescription(s): albuterol, budesonide, budesonide-formoterol, dextromethorphan-guaifenesin, finasteride, gabapentin, hydrocodone-acetaminophen, ipratropium-albuterol, meloxicam, metformin, sertraline, sildenafil, azithromycin, and trelegy ellipta. He  reports that he has been smoking cigarettes. He has been smoking about 0.50 packs per day. He has never used smokeless tobacco. He reports current alcohol use. He reports that he does not use drugs. Mr. Wenzler is allergic to morphine and related.   HPI  Today, he is being contacted for a post-procedure assessment.   Post-Procedure Evaluation  Procedure (09/14/2020):   Type: Lumbar Facet, Medial Branch Block(s) #1  Primary Purpose: Diagnostic Region: Posterolateral Lumbosacral Spine Level: L3, L4, L5,  Medial Branch Level(s). Injecting these levels blocks the L3-4, L4-5, and L5-S1 lumbar facet joints. Laterality: Bilateral   Sedation: Please see nurses note.  Effectiveness during initial hour after procedure(Ultra-Short Term Relief): 100 %   Local anesthetic used: Long-acting (4-6 hours) Effectiveness: Defined as  any analgesic benefit obtained secondary to the administration of local anesthetics. This carries significant diagnostic value as to the etiological location, or anatomical origin, of the pain. Duration of benefit is  expected to coincide with the duration of the local anesthetic used.  Effectiveness during initial 4-6 hours after procedure(Short-Term Relief): 100 %   Long-term benefit: Defined as any relief past the pharmacologic duration of the local anesthetics.  Effectiveness past the initial 6 hours after procedure(Long-Term Relief): 80 % (5 days)   Current benefits: Defined as benefit that persist at this time.   Analgesia:  Back to baseline Function: Back to baseline ROM: Back to baseline   UDS:  Summary  Date Value Ref Range Status  09/08/2020 Note  Final    Comment:    ==================================================================== Compliance Drug Analysis, Ur ==================================================================== Test                             Result       Flag       Units  Drug Present and Declared for Prescription Verification   Dextromethorphan               PRESENT      EXPECTED   Dextrorphan/Levorphanol        PRESENT      EXPECTED    Dextrorphan is an expected metabolite of dextromethorphan, an over-    the-counter or prescription cough suppressant. Dextrorphan cannot be    distinguished from the scheduled prescription medication levorphanol    by the method used for analysis.  Drug Present not Declared for Prescription Verification   Naproxen                       PRESENT      UNEXPECTED  Drug Absent but Declared for Prescription Verification   Hydrocodone                    Not Detected UNEXPECTED ng/mg creat   Gabapentin                     Not Detected UNEXPECTED   Sertraline                     Not Detected UNEXPECTED   Acetaminophen                  Not Detected UNEXPECTED    Acetaminophen, as indicated in the declared medication list,  is not    always detected even when used as directed.    Guaifenesin                    Not Detected UNEXPECTED ==================================================================== Test                      Result    Flag   Units      Ref Range   Creatinine              129              mg/dL      >=20 ==================================================================== Declared Medications:  The flagging and interpretation on this report are based on the  following declared medications.  Unexpected results may arise from  inaccuracies in the declared medications.   **Note: The testing scope of this panel includes these medications:   Dextromethorphan (Mucinex DM)  Gabapentin  Guaifenesin (Mucinex DM)  Hydrocodone  Sertraline (Zoloft)   **Note: The testing scope of this panel does not include small to  moderate amounts of these reported medications:  Acetaminophen   **Note: The testing scope of this panel does not include the  following reported medications:   Albuterol  Albuterol (Duoneb)  Azithromycin  Budesonide (Pulmicort)  Budesonide (Symbicort)  Finasteride (Proscar)  Fluticasone (Trelegy)  Formoterol (Symbicort)  Ipratropium (Duoneb)  Meloxicam  Metformin  Sildenafil  Umeclidinium (Trelegy)  Vilanterol (Trelegy) ==================================================================== For clinical consultation, please call [phone]. ====================================================================     Laboratory Chemistry Profile   Renal Lab Results  Component Value Date   BUN 21 09/03/2020   CREATININE 0.94 09/03/2020   GFRAA >60 09/03/2020   GFRNONAA >60 09/03/2020     Hepatic Lab Results  Component Value Date   AST 19 09/02/2020   ALT 18 09/02/2020   ALBUMIN 3.8 09/02/2020   ALKPHOS 63 09/02/2020     Electrolytes Lab Results  Component Value Date   NA 138 09/03/2020   K 4.3 09/03/2020   CL 106 09/03/2020   CALCIUM 9.2 09/03/2020    MG 2.4 05/28/2013     Bone No results found for: VD25OH, VD125OH2TOT, FH5456YB6, LS9373SK8, 25OHVITD1, 25OHVITD2, 25OHVITD3, TESTOFREE, TESTOSTERONE   Inflammation (CRP: Acute Phase) (ESR: Chronic Phase) Lab Results  Component Value Date   LATICACIDVEN 3.1 ([place]) 07/30/2018       Note: Above Lab results reviewed.   Assessment  The primary encounter diagnosis was Lumbar facet arthropathy. Diagnoses of Lumbar spondylosis, Lumbar degenerative disc disease, Spinal stenosis, lumbar region, with neurogenic claudication, Chronic radicular lumbar pain, and Chronic pain syndrome were also pertinent to this visit.  Plan of Care  Mr. Tajay Muzzy has a current medication list which includes the following long-term medication(s): albuterol, budesonide-formoterol, gabapentin, ipratropium-albuterol, metformin, and sertraline.   Postprocedural evaluation, virtual, after diagnostic lumbar facet medial branch nerve blocks bilaterally at L3, L4, L5 on 09/14/2020.  As noted above, blocks provided analgesic benefit and improvement in functional status for approximately 5 days.  Rates pain relief as 80% with gradual return of pain thereafter.  Discussed next steps of treatment algorithm and recommend repeating lumbar facet medial branch nerve blocks #2 and if similar results, greater than 50% pain relief for 5 days, will discuss lumbar radiofrequency ablation for the purpose of obtaining long-term pain benefit.  Patient in agreement with plan you would like to proceed with lumbar facet medial branch nerve blocks #2 bilaterally at L3, L4, L5.  Orders:  Orders Placed This Encounter  Procedures  . LUMBAR FACET(MEDIAL BRANCH NERVE BLOCK) MBNB    Standing Status:   Future    Standing Expiration Date:   10/30/2020    Scheduling Instructions:     Procedure: Lumbar facet block (AKA.: Lumbosacral medial branch nerve block)     Side: Bilateral     Level: L3-4, L4-5, Facets (L3, L4, L5, Medial Branch Nerves)      Sedation: Patient's choice.     Timeframe: ASAA    Order Specific Question:   Where will this procedure be performed?    Answer:   [facility]   Follow-up plan:   Return in about 2 weeks (around 10/13/2020) for B/L L3, L4, L5 facet medial branch nerve block #2, without sedation.     Status post bilateral L3, L4, L5 facet medial branch nerve block on 09/14/2020, 80% pain relief for 5 days with improvement.   Recent Visits Date Type Provider Dept  09/14/20 Procedure visit Gillis Santa, MD [facility]  09/08/20 Office Visit Gillis Santa, MD [facility]  Showing recent visits within past 90 days and meeting  all other requirements Today's Visits Date Type Provider Dept  09/29/20 Telemedicine Gillis Santa, MD [facility]  Showing today's visits and meeting all other requirements Future Appointments Date Type Provider Dept  10/06/20 Appointment Gillis Santa, MD [facility]  Showing future appointments within next 90 days and meeting all other requirements  I discussed the assessment and treatment plan with the patient. The patient was provided an opportunity to ask questions and all were answered. The patient agreed with the plan and demonstrated an understanding of the instructions.  Patient advised to call back or seek an in-person evaluation if the symptoms or condition worsens.  Duration of encounter:53mnutes.  Note by: BGillis Santa MD Date: 09/29/2020; Time: 3:23 PM

## 2020-09-30 ENCOUNTER — Ambulatory Visit: Payer: Medicare HMO | Admitting: Physical Therapy

## 2020-10-01 ENCOUNTER — Ambulatory Visit: Payer: Medicare HMO | Admitting: Student in an Organized Health Care Education/Training Program

## 2020-10-06 ENCOUNTER — Other Ambulatory Visit: Payer: Self-pay

## 2020-10-06 ENCOUNTER — Encounter: Payer: Medicare HMO | Admitting: Physical Therapy

## 2020-10-06 ENCOUNTER — Encounter: Payer: Self-pay | Admitting: Student in an Organized Health Care Education/Training Program

## 2020-10-06 ENCOUNTER — Ambulatory Visit
Payer: Medicare HMO | Attending: Student in an Organized Health Care Education/Training Program | Admitting: Student in an Organized Health Care Education/Training Program

## 2020-10-06 VITALS — BP 133/90 | HR 86 | Temp 97.3°F | Resp 18 | Ht 75.0 in | Wt 234.0 lb

## 2020-10-06 DIAGNOSIS — J441 Chronic obstructive pulmonary disease with (acute) exacerbation: Secondary | ICD-10-CM

## 2020-10-06 DIAGNOSIS — G894 Chronic pain syndrome: Secondary | ICD-10-CM | POA: Diagnosis present

## 2020-10-06 DIAGNOSIS — M47816 Spondylosis without myelopathy or radiculopathy, lumbar region: Secondary | ICD-10-CM

## 2020-10-06 DIAGNOSIS — M5416 Radiculopathy, lumbar region: Secondary | ICD-10-CM | POA: Diagnosis not present

## 2020-10-06 DIAGNOSIS — M48062 Spinal stenosis, lumbar region with neurogenic claudication: Secondary | ICD-10-CM | POA: Diagnosis present

## 2020-10-06 DIAGNOSIS — G8929 Other chronic pain: Secondary | ICD-10-CM | POA: Diagnosis present

## 2020-10-06 DIAGNOSIS — M5136 Other intervertebral disc degeneration, lumbar region: Secondary | ICD-10-CM | POA: Diagnosis present

## 2020-10-06 MED ORDER — GABAPENTIN 300 MG PO CAPS: 300.0000 mg | ORAL_CAPSULE | Freq: Two times a day (BID) | ORAL | 2 refills | Status: AC

## 2020-10-06 NOTE — Patient Instructions (Signed)

## 2020-10-06 NOTE — Progress Notes (Signed)
Safety precautions to be maintained throughout the outpatient stay will include: orient to surroundings, keep bed in low position, maintain call bell within reach at all times, provide assistance with transfer out of bed and ambulation.  

## 2020-10-06 NOTE — Progress Notes (Signed)
PROVIDER NOTE: Information contained herein reflects review and annotations entered in association with encounter. Interpretation of such information and data should be left to medically-trained personnel. Information provided to patient can be located elsewhere in the medical record under "Patient Instructions". Document created using STT-dictation technology, any transcriptional errors that may result from process are unintentional.    Patient: Ernest [name]  Service Category: E/M  Provider: Gillis Santa, MD  DOB: Apr 19, 1956  DOS: 10/06/2020  Specialty: Interventional Pain Management  MRN: [id number]  Setting: Ambulatory outpatient  PCP: Tracie Harrier, MD  Type: Established Patient    Referring Provider: Tracie Harrier, MD  Location: Office  Delivery: Face-to-face     HPI  [name], a 64 y.o. year old male, is here today because of his Lumbar facet arthropathy [M47.816]. [name] primary complain today is Back Pain (lower) Last encounter: My last encounter with him was on 09/14/2020. Pertinent problems: [name] does not have any pertinent problems on file. Pain Assessment: Severity of Chronic pain is reported as a 10-Worst pain ever/10. Location: Back Lower/back of right leg to the knee. Onset: More than a month ago. Quality: Stabbing. Timing: Constant. Modifying factor(s): nothing. Vitals:  height is '6\' 3"'  (1.905 m) and weight is 234 lb (106.1 kg). His temporal temperature is 97.3 F (36.3 C) (abnormal). His blood pressure is 133/90 and his pulse is 86. His respiration is 18 and oxygen saturation is 98%.   Reason for encounter: medication management.  Presents today for refill of gabapentin prescription.  Has titrated up to 300 mg nightly and is noticing some mild benefit from this.  Is not endorsing any side effects of sedation or lower extremity swelling.  Recommend patient increase to 300 mg twice daily.  He has an upcoming appointment for his second set of diagnostic lumbar  facet medial branch nerve blocks and so long as those are effective like the first ones can hopefully plan for radiofrequency ablation of medial branch nerves in the future to provide long-lasting pain relief.   ROS  Constitutional: Denies any fever or chills Gastrointestinal: No reported hemesis, hematochezia, vomiting, or acute GI distress Musculoskeletal: Denies any acute onset joint swelling, redness, loss of ROM, or weakness Neurological: No reported episodes of acute onset apraxia, aphasia, dysarthria, agnosia, amnesia, paralysis, loss of coordination, or loss of consciousness  Medication Review  HYDROcodone-acetaminophen, albuterol, budesonide, budesonide-formoterol, dextromethorphan-guaiFENesin, finasteride, gabapentin, meloxicam, metFORMIN, sertraline, and sildenafil  History Review  Allergy: [name] is allergic to morphine and related. Drug: [name]  reports no history of drug use. Alcohol:  reports current alcohol use. Tobacco:  reports that he has been smoking cigarettes. He has been smoking about 0.50 packs per day. He has never used smokeless tobacco. Social: [name]  reports that he has been smoking cigarettes. He has been smoking about 0.50 packs per day. He has never used smokeless tobacco. He reports current alcohol use. He reports that he does not use drugs. Medical:  has a past medical history of Acute on chronic respiratory failure with hypoxia ([place]) (05/25/2017), Acute respiratory failure with hypoxia ([place]) (05/25/2017), COPD (chronic obstructive pulmonary disease) ([place]), COPD with acute bronchitis ([place]) (05/25/2017), COPD with acute exacerbation ([place]) (05/25/2017), Diabetes mellitus without complication ([place]), Leukocytosis (05/25/2017), and Prostate cancer ([place]). Surgical: [name]  has a past surgical history that includes Testicle surgery. Family: family history includes Breast cancer in his mother; Stroke in his mother.  Laboratory Chemistry Profile    Renal Lab Results  Component Value  Date   BUN 21 09/03/2020   CREATININE 0.94 09/03/2020   GFRAA >60 09/03/2020   GFRNONAA >60 09/03/2020     Hepatic Lab Results  Component Value Date   AST 19 09/02/2020   ALT 18 09/02/2020   ALBUMIN 3.8 09/02/2020   ALKPHOS 63 09/02/2020     Electrolytes Lab Results  Component Value Date   NA 138 09/03/2020   K 4.3 09/03/2020   CL 106 09/03/2020   CALCIUM 9.2 09/03/2020   MG 2.4 05/28/2013     Bone No results found for: VD25OH, VD125OH2TOT, ZH0865HQ4, ON6295MW4, 25OHVITD1, 25OHVITD2, 25OHVITD3, TESTOFREE, TESTOSTERONE   Inflammation (CRP: Acute Phase) (ESR: Chronic Phase) Lab Results  Component Value Date   LATICACIDVEN 3.1 ([place]) 07/30/2018       Note: Above Lab results reviewed.   Psych/Mental status: Alert, oriented x 3 (person, place, & time)      Respiratory: Oxygen-dependent COPD, 3 L, increased work of breathing, occasional cough  Cervical Spine Exam  Skin & Axial Inspection: No masses, redness, edema, swelling, or associated skin lesions Alignment: Symmetrical Functional ROM: Pain restricted ROM      Stability: No instability detected Muscle Tone/Strength: Functionally intact. No obvious neuro-muscular anomalies detected. Sensory (Neurological): Musculoskeletal pain pattern Palpation: Complains of area being tender to palpation                    Upper Extremity (UE) Exam    Side: Right upper extremity  Side: Left upper extremity   Skin & Extremity Inspection: Skin color, temperature, and hair growth are WNL. No peripheral edema or cyanosis. No masses, redness, swelling, asymmetry, or associated skin lesions. No contractures.  Skin & Extremity Inspection: Skin color, temperature, and hair growth are WNL. No peripheral edema or cyanosis. No masses, redness, swelling, asymmetry, or associated skin lesions. No contractures.   Functional ROM: Unrestricted ROM          Functional ROM: Unrestricted ROM            Muscle Tone/Strength: Functionally intact. No obvious neuro-muscular anomalies detected.   Muscle Tone/Strength: Functionally intact. No obvious neuro-muscular anomalies detected.   Sensory (Neurological): Neurogenic pain pattern          Sensory (Neurological): Neurogenic pain pattern           Palpation: No palpable anomalies              Palpation: No palpable anomalies               Provocative Test(s):  Phalen's test: deferred Tinel's test: deferred Apley's scratch test (touch opposite shoulder):  Action 1 (Across chest): deferred Action 2 (Overhead): deferred Action 3 (LB reach): deferred   Provocative Test(s):  Phalen's test: deferred Tinel's test: deferred Apley's scratch test (touch opposite shoulder):  Action 1 (Across chest): deferred Action 2 (Overhead): deferred Action 3 (LB reach): deferred     Thoracic Spine Area Exam  Skin & Axial Inspection: No masses, redness, or swelling Alignment: Symmetrical Functional ROM: Unrestricted ROM Stability: No instability detected Muscle Tone/Strength: Functionally intact. No obvious neuro-muscular anomalies detected. Sensory (Neurological): Unimpaired Muscle strength & Tone: No palpable anomalies  Lumbar Exam  Skin & Axial Inspection: No masses, redness, or swelling Alignment: Symmetrical Functional ROM: Pain restricted ROM affecting both sides Stability: No instability detected Muscle Tone/Strength: Functionally intact. No obvious neuro-muscular anomalies detected. Sensory (Neurological): Musculoskeletal pain pattern Palpation: No palpable anomalies       Provocative Tests: Hyperextension/rotation test: (+) bilaterally for facet  joint pain. Lumbar quadrant test (Kemp's test): (+) bilaterally for facet joint pain. Lateral bending test: (+) due to pain. Patrick's Maneuver: (+) due to pain             FABER* test: deferred today                   S-I anterior distraction/compression test: deferred today         S-I  lateral compression test: deferred today         S-I Thigh-thrust test: deferred today         S-I Gaenslen's test: deferred today         *(Flexion, ABduction and External Rotation)  Gait & Posture Assessment  Ambulation: Patient came in today in a wheel chair Gait: Antalgic gait (limping) Posture: Difficulty standing up straight, due to pain   Lower Extremity Exam    Side: Right lower extremity  Side: Left lower extremity  Stability: No instability observed          Stability: No instability observed          Skin & Extremity Inspection: Skin color, temperature, and hair growth are WNL. No peripheral edema or cyanosis. No masses, redness, swelling, asymmetry, or associated skin lesions. No contractures.  Skin & Extremity Inspection: Skin color, temperature, and hair growth are WNL. No peripheral edema or cyanosis. No masses, redness, swelling, asymmetry, or associated skin lesions. No contractures.  Functional ROM: Pain restricted ROM for hip and knee joints          Functional ROM: Unrestricted ROM                  Muscle Tone/Strength: Functionally intact. No obvious neuro-muscular anomalies detected.  Muscle Tone/Strength: Functionally intact. No obvious neuro-muscular anomalies detected.  Sensory (Neurological): Neurogenic pain pattern and musculoskeletal        Sensory (Neurological): Unimpaired        DTR: Patellar: deferred today Achilles: deferred today Plantar: deferred today  DTR: Patellar: deferred today Achilles: deferred today Plantar: deferred today  Palpation: No palpable anomalies  Palpation: No palpable anomalies     Assessment   Status Diagnosis  Persistent Persistent Persistent 1. Lumbar facet arthropathy   2. Lumbar spondylosis   3. Lumbar degenerative disc disease   4. Spinal stenosis, lumbar region, with neurogenic claudication   5. Chronic radicular lumbar pain   6. Chronic pain syndrome   7. COPD exacerbation ([place])      Plan of Care   [name] has a current medication list which includes the following long-term medication(s): albuterol, budesonide-formoterol, metformin, sertraline, and gabapentin.  Pharmacotherapy (Medications Ordered): Meds ordered this encounter  Medications  . gabapentin (NEURONTIN) 300 MG capsule    Sig: Take 1 capsule (300 mg total) by mouth 2 (two) times daily.    Dispense:  60 capsule    Refill:  2   Follow-up plan:   Return in about 3 months (around 01/06/2021) for Medication Management, in person.     Status post bilateral L3, L4, L5 facet medial branch nerve block on 09/14/2020, 80% pain relief for 5 days with improvement.    Recent Visits Date Type Provider Dept  09/29/20 Telemedicine Gillis Santa, MD [facility]  09/14/20 Procedure visit Gillis Santa, MD [facility]  09/08/20 Office Visit Gillis Santa, MD [facility]  Showing recent visits within past 90 days and meeting all other requirements Today's Visits Date Type Provider Dept  10/06/20  Office Visit Gillis Santa, MD [facility]  Showing today's visits and meeting all other requirements Future Appointments Date Type Provider Dept  10/14/20 Appointment Gillis Santa, MD [facility]  12/31/20 Appointment Gillis Santa, MD [facility]  Showing future appointments within next 90 days and meeting all other requirements  I discussed the assessment and treatment plan with the patient. The patient was provided an opportunity to ask questions and all were answered. The patient agreed with the plan and demonstrated an understanding of the instructions.  Patient advised to call back or seek an in-person evaluation if the symptoms or condition worsens.  Duration of encounter: 20 minutes.  Note by: Gillis Santa, MD Date: 10/06/2020; Time: 4:00 PM

## 2020-10-08 ENCOUNTER — Encounter: Payer: Medicare HMO | Admitting: Physical Therapy

## 2020-10-12 ENCOUNTER — Telehealth: Payer: Self-pay

## 2020-10-12 ENCOUNTER — Telehealth: Payer: Medicare HMO | Admitting: Student in an Organized Health Care Education/Training Program

## 2020-10-12 NOTE — Telephone Encounter (Signed)
Contacted patient for lung CT screening clinic based on referral from Dr. Ginette Pitman.  Message left for patient to call Burgess Estelle, lung navigator to schedule CT scan.

## 2020-10-14 ENCOUNTER — Other Ambulatory Visit: Payer: Self-pay

## 2020-10-14 ENCOUNTER — Ambulatory Visit
Admission: RE | Admit: 2020-10-14 | Discharge: 2020-10-14 | Disposition: A | Discharge status: Home / Self Care | Payer: Medicare HMO | Source: Ambulatory Visit | Attending: Student in an Organized Health Care Education/Training Program | Admitting: Student in an Organized Health Care Education/Training Program

## 2020-10-14 ENCOUNTER — Ambulatory Visit (HOSPITAL_BASED_OUTPATIENT_CLINIC_OR_DEPARTMENT_OTHER): Payer: Medicare HMO | Admitting: Student in an Organized Health Care Education/Training Program

## 2020-10-14 ENCOUNTER — Encounter: Payer: Self-pay | Admitting: Student in an Organized Health Care Education/Training Program

## 2020-10-14 VITALS — BP 136/93 | HR 141 | Temp 97.3°F | Resp 22 | Ht 75.0 in | Wt 242.0 lb

## 2020-10-14 DIAGNOSIS — M47816 Spondylosis without myelopathy or radiculopathy, lumbar region: Secondary | ICD-10-CM | POA: Diagnosis present

## 2020-10-14 DIAGNOSIS — M5136 Other intervertebral disc degeneration, lumbar region: Secondary | ICD-10-CM

## 2020-10-14 DIAGNOSIS — M5412 Radiculopathy, cervical region: Secondary | ICD-10-CM

## 2020-10-14 DIAGNOSIS — G894 Chronic pain syndrome: Secondary | ICD-10-CM

## 2020-10-14 MED ORDER — DEXAMETHASONE SODIUM PHOSPHATE 10 MG/ML IJ SOLN
10.0000 mg | Freq: Once | INTRAMUSCULAR | Status: AC
  Administered 2020-10-14: 10 mg

## 2020-10-14 MED ORDER — LIDOCAINE HCL 2 % IJ SOLN
INTRAMUSCULAR | Status: AC
  Filled 2020-10-14: qty 20

## 2020-10-14 MED ORDER — ROPIVACAINE HCL 2 MG/ML IJ SOLN
9.0000 mL | Freq: Once | INTRAMUSCULAR | Status: AC
  Administered 2020-10-14: 10 mL via PERINEURAL

## 2020-10-14 MED ORDER — LIDOCAINE HCL 2 % IJ SOLN
20.0000 mL | Freq: Once | INTRAMUSCULAR | Status: AC
  Administered 2020-10-14: 400 mg

## 2020-10-14 MED ORDER — DEXAMETHASONE SODIUM PHOSPHATE 10 MG/ML IJ SOLN
INTRAMUSCULAR | Status: AC
  Filled 2020-10-14: qty 2

## 2020-10-14 MED ORDER — MIDAZOLAM HCL 5 MG/5ML IJ SOLN
INTRAMUSCULAR | Status: AC
  Filled 2020-10-14: qty 5

## 2020-10-14 MED ORDER — FENTANYL CITRATE (PF) 100 MCG/2ML IJ SOLN
INTRAMUSCULAR | Status: AC
  Filled 2020-10-14: qty 2

## 2020-10-14 MED ORDER — ROPIVACAINE HCL 2 MG/ML IJ SOLN
INTRAMUSCULAR | Status: AC
  Filled 2020-10-14: qty 20

## 2020-10-14 MED ORDER — MIDAZOLAM HCL 5 MG/5ML IJ SOLN
1.0000 mg | INTRAMUSCULAR | Status: DC | PRN
  Administered 2020-10-14: 5 mg via INTRAVENOUS

## 2020-10-14 NOTE — Patient Instructions (Signed)

## 2020-10-14 NOTE — Progress Notes (Signed)
PROVIDER NOTE: Information contained herein reflects review and annotations entered in association with encounter. Interpretation of such information and data should be left to medically-trained personnel. Information provided to patient can be located elsewhere in the medical record under "Patient Instructions". Document created using STT-dictation technology, any transcriptional errors that may result from process are unintentional.    Patient: Ernest [name]  Service Category: Procedure  Provider: Gillis Santa, MD  DOB: 05-17-1956  DOS: 10/14/2020  Location: [facility]  MRN: [id number]  Setting: Ambulatory - outpatient  Referring Provider: Gillis Santa, MD  Type: Established Patient  Specialty: Interventional Pain Management  PCP: Tracie Harrier, MD   Primary Reason for Visit: Interventional Pain Management Treatment. CC: Back Pain (low), Knee Pain (right), and Leg Pain (right)  Procedure:          Anesthesia, Analgesia, Anxiolysis:  Type: Lumbar Facet, Medial Branch Block(s) #2  Primary Purpose: Diagnostic Region: Posterolateral Lumbosacral Spine Level: L3, L4, L5,  Medial Branch Level(s). Injecting these levels blocks the L3-4, L4-5 lumbar facet joints. Laterality: Bilateral  Type: Local Anesthesia with IV sedation with Midazolam  Local Anesthetic: Lidocaine 1-2%  Position: Prone   Indications: 1. Lumbar facet arthropathy   2. Lumbar spondylosis   3. Lumbar degenerative disc disease   4. Chronic pain syndrome   5. Cervical radicular pain    Pain Score: Pre-procedure: 10-Worst pain ever/10 Post-procedure: 0-No pain/10   Pre-op Assessment:  Ernest [name] is a 64 y.o. (year old), male patient, seen today for interventional treatment. He  has a past surgical history that includes Testicle surgery. Ernest [name] has a current medication list which includes the following prescription(s): albuterol, budesonide, budesonide-formoterol, dextromethorphan-guaifenesin, finasteride,  gabapentin, hydrocodone-acetaminophen, meloxicam, metformin, sertraline, and sildenafil, and the following Facility-Administered Medications: midazolam. His primarily concern today is the Back Pain (low), Knee Pain (right), and Leg Pain (right)  Initial Vital Signs:  Pulse/HCG Rate: (!) 141ECG Heart Rate: 62 Temp: (!) 97.1 F (36.2 C) Resp: 18 BP: (!) 141/111 SpO2: 100 % (O2 at [facility])  BMI: Estimated body mass index is 30.25 kg/m as calculated from the following:   Height as of this encounter: 6\' 3"  (1.905 m).   Weight as of this encounter: 242 lb (109.8 kg).  Risk Assessment: Allergies: Reviewed. He is allergic to morphine and related.  Allergy Precautions: None required Coagulopathies: Reviewed. None identified.  Blood-thinner therapy: None at this time Active Infection(s): Reviewed. None identified. Ernest [name] is afebrile  Site Confirmation: Ernest [name] was asked to confirm the procedure and laterality before marking the site Procedure checklist: Completed Consent: Before the procedure and under the influence of no sedative(s), amnesic(s), or anxiolytics, the patient was informed of the treatment options, risks and possible complications. To fulfill our ethical and legal obligations, as recommended by the American Medical Association's Code of Ethics, I have informed the patient of my clinical impression; the nature and purpose of the treatment or procedure; the risks, benefits, and possible complications of the intervention; the alternatives, including doing nothing; the risk(s) and benefit(s) of the alternative treatment(s) or procedure(s); and the risk(s) and benefit(s) of doing nothing. The patient was provided information about the general risks and possible complications associated with the procedure. These may include, but are not limited to: failure to achieve desired goals, infection, bleeding, organ or nerve damage, allergic reactions, paralysis, and death. In addition, the  patient was informed of those risks and complications associated to Spine-related procedures, such as failure to decrease pain; infection (i.e.: Meningitis,  epidural or intraspinal abscess); bleeding (i.e.: epidural hematoma, subarachnoid hemorrhage, or any other type of intraspinal or peri-dural bleeding); organ or nerve damage (i.e.: Any type of peripheral nerve, nerve root, or spinal cord injury) with subsequent damage to sensory, motor, and/or autonomic systems, resulting in permanent pain, numbness, and/or weakness of one or several areas of the body; allergic reactions; (i.e.: anaphylactic reaction); and/or death. Furthermore, the patient was informed of those risks and complications associated with the medications. These include, but are not limited to: allergic reactions (i.e.: anaphylactic or anaphylactoid reaction(s)); adrenal axis suppression; blood sugar elevation that in diabetics may result in ketoacidosis or comma; water retention that in patients with history of congestive heart failure may result in shortness of breath, pulmonary edema, and decompensation with resultant heart failure; weight gain; swelling or edema; medication-induced neural toxicity; particulate matter embolism and blood vessel occlusion with resultant organ, and/or nervous system infarction; and/or aseptic necrosis of one or more joints. Finally, the patient was informed that Medicine is not an exact science; therefore, there is also the possibility of unforeseen or unpredictable risks and/or possible complications that may result in a catastrophic outcome. The patient indicated having understood very clearly. We have given the patient no guarantees and we have made no promises. Enough time was given to the patient to ask questions, all of which were answered to the patient's satisfaction. Ernest [name] has indicated that he wanted to continue with the procedure. Attestation: I, the ordering provider, attest that I have discussed  with the patient the benefits, risks, side-effects, alternatives, likelihood of achieving goals, and potential problems during recovery for the procedure that I have provided informed consent. Date  Time: 10/14/2020 11:05 AM  Pre-Procedure Preparation:  Monitoring: As per clinic protocol. Respiration, ETCO2, SpO2, BP, heart rate and rhythm monitor placed and checked for adequate function Safety Precautions: Patient was assessed for positional comfort and pressure points before starting the procedure. Time-out: I initiated and conducted the "Time-out" before starting the procedure, as per protocol. The patient was asked to participate by confirming the accuracy of the "Time Out" information. Verification of the correct person, site, and procedure were performed and confirmed by me, the nursing staff, and the patient. "Time-out" conducted as per Joint Commission's Universal Protocol (UP.01.01.01). Time: 1154  Description of Procedure:          Laterality: Bilateral. The procedure was performed in identical fashion on both sides. Levels: L3, L4, L5,Medial Branch Level(s) Area Prepped: Posterior Lumbosacral Region DuraPrep (Iodine Povacrylex [0.7% available iodine] and Isopropyl Alcohol, 74% w/w) Safety Precautions: Aspiration looking for blood return was conducted prior to all injections. At no point did we inject any substances, as a needle was being advanced. Before injecting, the patient was told to immediately notify me if he was experiencing any new onset of "ringing in the ears, or metallic taste in the mouth". No attempts were made at seeking any paresthesias. Safe injection practices and needle disposal techniques used. Medications properly checked for expiration dates. SDV (single dose vial) medications used. After the completion of the procedure, all disposable equipment used was discarded in the proper designated medical waste containers. Local Anesthesia: Protocol guidelines were followed.  The patient was positioned over the fluoroscopy table. The area was prepped in the usual manner. The time-out was completed. The target area was identified using fluoroscopy. A 12-in long, straight, sterile hemostat was used with fluoroscopic guidance to locate the targets for each level blocked. Once located, the skin was marked with an  approved surgical skin marker. Once all sites were marked, the skin (epidermis, dermis, and hypodermis), as well as deeper tissues (fat, connective tissue and muscle) were infiltrated with a small amount of a short-acting local anesthetic, loaded on a 10cc syringe with a 25G, 1.5-in  Needle. An appropriate amount of time was allowed for local anesthetics to take effect before proceeding to the next step. Local Anesthetic: Lidocaine 2.0% The unused portion of the local anesthetic was discarded in the proper designated containers. Technical explanation of process:  L3 Medial Branch Nerve Block (MBB): The target area for the L3 medial branch is at the junction of the postero-lateral aspect of the superior articular process and the superior, posterior, and medial edge of the transverse process of L4. Under fluoroscopic guidance, a Quincke needle was inserted until contact was made with os over the superior postero-lateral aspect of the pedicular shadow (target area). After negative aspiration for blood, 2 mL of the nerve block solution was injected without difficulty or complication. The needle was removed intact. L4 Medial Branch Nerve Block (MBB): The target area for the L4 medial branch is at the junction of the postero-lateral aspect of the superior articular process and the superior, posterior, and medial edge of the transverse process of L5. Under fluoroscopic guidance, a Quincke needle was inserted until contact was made with os over the superior postero-lateral aspect of the pedicular shadow (target area). After negative aspiration for blood, 2 mL of the nerve block solution  was injected without difficulty or complication. The needle was removed intact. L5 Medial Branch Nerve Block (MBB): The target area for the L5 medial branch is at the junction of the postero-lateral aspect of the superior articular process and the superior, posterior, and medial edge of the sacral ala. Under fluoroscopic guidance, a Quincke needle was inserted until contact was made with os over the superior postero-lateral aspect of the pedicular shadow (target area). After negative aspiration for blood, 2 mL of the nerve block solution was injected without difficulty or complication. The needle was removed intact.   Nerve block solution: 12 cc solution made of 10 cc of 0.2% ropivacaine, 2 cc of Decadron 10 mg/cc.  2 cc injected at each level above bilaterally.  The unused portion of the solution was discarded in the proper designated containers. Procedural Needles: 25-gauge, 3.5-inch, Quincke needles used for all levels.  Once the entire procedure was completed, the treated area was cleaned, making sure to leave some of the prepping solution back to take advantage of its long term bactericidal properties.   Illustration of the posterior view of the lumbar spine and the posterior neural structures. Laminae of L2 through S1 are labeled. DPRL5, dorsal primary ramus of L5; DPRS1, dorsal primary ramus of S1; DPR3, dorsal primary ramus of L3; FJ, facet (zygapophyseal) joint L3-L4; I, inferior articular process of L4; LB1, lateral branch of dorsal primary ramus of L1; IAB, inferior articular branches from L3 medial branch (supplies L4-L5 facet joint); IBP, intermediate branch plexus; MB3, medial branch of dorsal primary ramus of L3; NR3, third lumbar nerve root; S, superior articular process of L5; SAB, superior articular branches from L4 (supplies L4-5 facet joint also); TP3, transverse process of L3.  Vitals:   10/14/20 1205 10/14/20 1214 10/14/20 1225 10/14/20 1235  BP: (!) 153/104 (!) 142/88 (!) 139/92  (!) 136/93  Pulse:      Resp: 17 (!) 22 (!) 22 (!) 22  Temp:  (!) 97.4 F (36.3 C)  (!) 97.3 F (  36.3 C)  TempSrc:      SpO2: 100% 100% 100% 100%  Weight:      Height:         Start Time: 1155 hrs. End Time: 1204 hrs.  Imaging Guidance (Spinal):          Type of Imaging Technique: Fluoroscopy Guidance (Spinal) Indication(s): Assistance in needle guidance and placement for procedures requiring needle placement in or near specific anatomical locations not easily accessible without such assistance. Exposure Time: Please see nurses notes. Contrast: None used. Fluoroscopic Guidance: I was personally present during the use of fluoroscopy. "Tunnel Vision Technique" used to obtain the best possible view of the target area. Parallax error corrected before commencing the procedure. "Direction-depth-direction" technique used to introduce the needle under continuous pulsed fluoroscopy. Once target was reached, antero-posterior, oblique, and lateral fluoroscopic projection used confirm needle placement in all planes. Images permanently stored in EMR. Interpretation: No contrast injected. I personally interpreted the imaging intraoperatively. Adequate needle placement confirmed in multiple planes. Permanent images saved into the patient's record.  Antibiotic Prophylaxis:   Anti-infectives (From admission, onward)   None     Indication(s): None identified  Post-operative Assessment:  Post-procedure Vital Signs:  Pulse/HCG Rate: (!) 14170 Temp: (!) 97.3 F (36.3 C) Resp: (!) 22 BP: (!) 136/93 SpO2: 100 %  EBL: None  Complications: No immediate post-treatment complications observed by team, or reported by patient.  Note: The patient tolerated the entire procedure well. A repeat set of vitals were taken after the procedure and the patient was kept under observation following institutional policy, for this type of procedure. Post-procedural neurological assessment was performed, showing  return to baseline, prior to discharge. The patient was provided with post-procedure discharge instructions, including a section on how to identify potential problems. Should any problems arise concerning this procedure, the patient was given instructions to immediately contact us, at any time, without hesitation. In any case, we plan to contact the patient by telephone for a follow-up status report regarding this interventional procedure.  Comments:  No additional relevant information.  Plan of Care  Orders:  Orders Placed This Encounter  Procedures  . [facility] PA[facility] C-ARM 1-60 MIN NO REPORT    Intraoperative interpretation by procedural physician at [place].    Standing Status:   Standing    Number of Occurrences:   1    Order Specific Question:   Reason for exam:    Answer:   Assistance in needle guidance and placement for procedures requiring needle placement in or near specific anatomical locations not easily accessible without such assistance.  . Ambulatory referral to Physical Therapy    Referral Priority:   Routine    Referral Type:   Physical Medicine    Referral Reason:   Specialty Services Required    Requested Specialty:   Physical Therapy    Number of Visits Requested:   1   Medications ordered for procedure: Meds ordered this encounter  Medications  . lidocaine (XYLOCAINE) 2 % (with pres) injection 400 mg  . midazolam (VERSED) 5 MG/5ML injection 1-2 mg    Make sure Flumazenil is available in the pyxis when using this medication. If oversedation occurs, administer 0.2 mg IV over 15 sec. If after 45 sec no response, administer 0.2 mg again over 1 min; may repeat at 1 min intervals; not to exceed 4 doses (1 mg)  . ropivacaine (PF) 2 mg/mL (0.2%) (NAROPIN) injection 9 mL  . ropivacaine (PF) 2 mg/mL (0.2%) (  NAROPIN) injection 9 mL  . dexamethasone (DECADRON) injection 10 mg  . dexamethasone (DECADRON) injection 10 mg   Medications administered: We administered  lidocaine, midazolam, ropivacaine (PF) 2 mg/mL (0.2%), ropivacaine (PF) 2 mg/mL (0.2%), dexamethasone, and dexamethasone.  See the medical record for exact dosing, route, and time of administration.  Follow-up plan:   Return in about 4 weeks (around 11/11/2020) for Post Procedure Evaluation, in person.    Recent Visits Date Type Provider Dept  10/06/20 Office Visit Gillis Santa, MD [facility]  09/29/20 Telemedicine Gillis Santa, MD [facility]  09/14/20 Procedure visit Gillis Santa, MD [facility]  09/08/20 Office Visit Gillis Santa, MD [facility]  Showing recent visits within past 90 days and meeting all other requirements Today's Visits Date Type Provider Dept  10/14/20 Procedure visit Gillis Santa, MD [facility]  Showing today's visits and meeting all other requirements Future Appointments Date Type Provider Dept  11/11/20 Appointment Gillis Santa, MD [facility]  12/31/20 Appointment Gillis Santa, MD [facility]  Showing future appointments within next 90 days and meeting all other requirements  Disposition: Discharge home  Discharge (Date  Time): 10/14/2020; 1241 hrs.   Primary Care Physician: Tracie Harrier, MD Location: [facility] Note by: Gillis Santa, MD Date: 10/14/2020; Time: 1:33 PM  Disclaimer:  Medicine is not an exact science. The only guarantee in medicine is that nothing is guaranteed. It is important to note that the decision to proceed with this intervention was based on the information collected from the patient. The Data and conclusions were drawn from the patient's questionnaire, the interview, and the physical examination. Because the information was provided in large part by the patient, it cannot be guaranteed that it has not been purposely or unconsciously manipulated. Every effort has been made to obtain as much relevant data as possible for this  evaluation. It is important to note that the conclusions that lead to this procedure are derived in large part from the available data. Always take into account that the treatment will also be dependent on availability of resources and existing treatment guidelines, considered by other Pain Management Practitioners as being common knowledge and practice, at the time of the intervention. For Medico-Legal purposes, it is also important to point out that variation in procedural techniques and pharmacological choices are the acceptable norm. The indications, contraindications, technique, and results of the above procedure should only be interpreted and judged by a Board-Certified Interventional Pain Specialist with extensive familiarity and expertise in the same exact procedure and technique.

## 2020-10-14 NOTE — Progress Notes (Signed)
Safety precautions to be maintained throughout the outpatient stay will include: orient to surroundings, keep bed in low position, maintain call bell within reach at all times, provide assistance with transfer out of bed and ambulation.  

## 2020-10-15 ENCOUNTER — Telehealth: Payer: Self-pay

## 2020-10-15 NOTE — Telephone Encounter (Signed)
Post procedure phone call.  Answering machine was in [place].  Did not leave a message.

## 2020-10-20 ENCOUNTER — Telehealth: Payer: Self-pay | Admitting: Student in an Organized Health Care Education/Training Program

## 2020-10-20 NOTE — Telephone Encounter (Signed)
I will forward this to Dr. Holley Raring.

## 2020-10-20 NOTE — Telephone Encounter (Signed)
Still having pain since procedure no better, says Dr. Holley Raring wanted him to call and let him know how he is doing.

## 2020-10-25 ENCOUNTER — Emergency Department
Admission: EM | Admit: 2020-10-25 | Discharge: 2020-10-25 | Disposition: A | Discharge status: Home / Self Care | Payer: Medicare HMO | Attending: Emergency Medicine | Admitting: Emergency Medicine

## 2020-10-25 ENCOUNTER — Other Ambulatory Visit: Payer: Self-pay

## 2020-10-25 ENCOUNTER — Emergency Department: Payer: Medicare HMO

## 2020-10-25 ENCOUNTER — Encounter: Payer: Self-pay | Admitting: Emergency Medicine

## 2020-10-25 DIAGNOSIS — M5416 Radiculopathy, lumbar region: Secondary | ICD-10-CM | POA: Diagnosis not present

## 2020-10-25 DIAGNOSIS — E119 Type 2 diabetes mellitus without complications: Secondary | ICD-10-CM | POA: Insufficient documentation

## 2020-10-25 DIAGNOSIS — M79604 Pain in right leg: Secondary | ICD-10-CM | POA: Diagnosis not present

## 2020-10-25 DIAGNOSIS — Z85828 Personal history of other malignant neoplasm of skin: Secondary | ICD-10-CM | POA: Insufficient documentation

## 2020-10-25 DIAGNOSIS — J441 Chronic obstructive pulmonary disease with (acute) exacerbation: Secondary | ICD-10-CM | POA: Diagnosis not present

## 2020-10-25 DIAGNOSIS — F17210 Nicotine dependence, cigarettes, uncomplicated: Secondary | ICD-10-CM | POA: Diagnosis not present

## 2020-10-25 DIAGNOSIS — Z7984 Long term (current) use of oral hypoglycemic drugs: Secondary | ICD-10-CM | POA: Diagnosis not present

## 2020-10-25 DIAGNOSIS — M5441 Lumbago with sciatica, right side: Secondary | ICD-10-CM | POA: Diagnosis present

## 2020-10-25 MED ORDER — PREDNISONE 10 MG (21) PO TBPK: ORAL_TABLET | ORAL | 0 refills | Status: DC

## 2020-10-25 NOTE — Discharge Instructions (Signed)
Takes tapered steroid as directed.

## 2020-10-25 NOTE — ED Notes (Signed)
First Nurse Note: Pt ambulatory to ED c/o right leg pain.

## 2020-10-25 NOTE — ED Triage Notes (Signed)
Pt presents to ED via POV with c/o R lower back pain that radiates down to his R knee. Pt with noted limp to triage. Pt states significant hx of back pain, back surgery at this time. Pt states pain primarily to R knee. Pt states wants X-ray of R knee. Pt states increased pain to R lower back and R leg at this time. Pt states pain x 3 weeks.   Pt on chronic 3L O2.

## 2020-10-25 NOTE — ED Provider Notes (Signed)
Emergency Department Provider Note  ____________________________________________  Time seen: Approximately 4:49 PM  I have reviewed the triage vital signs and the nursing notes.   HISTORY  Chief Complaint Leg Pain and Back Pain   Historian Patient     HPI Ernest [name] is a 64 y.o. male presents to the emergency department with right-sided low back pain that radiates down the posterior aspect of the right lower extremity and stops at the knee.  Patient states that he is primarily complaining of posterior right knee pain and states that pain is better when he ambulates and relieved when he rests.  He has had chronic back pain for several years and is under the care of pain management.  He recently tried a new anti-inflammatory and states that it has not been improving his pain.  He denies pleuritic chest pain or shortness of breath.  He has noticed some slight swelling along the anterior aspect of the right knee.  No fever or chills.  He has not noticed any erythema.  No calf pain.  No other alleviating measures have been attempted.   Past Medical History:  Diagnosis Date   Acute on chronic respiratory failure with hypoxia ([place]) 05/25/2017   Acute respiratory failure with hypoxia ([facility]) 05/25/2017   COPD (chronic obstructive pulmonary disease) ([facility])    COPD with acute bronchitis ([place]) 05/25/2017   COPD with acute exacerbation ([place]) 05/25/2017   Diabetes mellitus without complication ([place])    Leukocytosis 05/25/2017   Prostate cancer ([place])      Immunizations up to date:  Yes.     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   Chronic radicular lumbar pain 09/08/2020   Lumbar degenerative disc disease 09/08/2020   Chronic pain syndrome 09/08/2020   Type 2 diabetes mellitus ([place]) 09/02/2020   COPD exacerbation ([place]) 04/29/2020   Prostate cancer ([place]) 12/27/2017   Elevated PSA 11/01/2017   Benign prostatic hyperplasia with lower urinary tract symptoms 11/01/2017   Acute on chronic respiratory failure with hypoxia ([place]) 05/25/2017   COPD with acute exacerbation ([place]) 05/25/2017   COPD with acute bronchitis ([place]) 05/25/2017   Leukocytosis 05/25/2017    Past Surgical History:  Procedure Laterality Date   TESTICLE SURGERY     undecended testicle repair    Prior to Admission medications   Medication Sig Start Date End Date Taking? Authorizing Provider  albuterol (VENTOLIN HFA) 108 (90 Base) MCG/ACT inhaler Inhale 2 puffs into the lungs every 4 (four) hours as needed for wheezing or shortness of breath. 05/02/20   Nicole Kindred A, DO  budesonide (PULMICORT) 0.5 MG/2ML nebulizer solution Take 0.5 mg by nebulization 2 (two) times daily. 10/17/19   [provider]  budesonide-formoterol (SYMBICORT) 160-4.5 MCG/ACT inhaler Inhale 2 puffs into the lungs 2 (two) times daily. 05/29/17   Dustin Flock, MD  dextromethorphan-guaiFENesin [facility] DM) 30-600 MG 12hr tablet Take 1 tablet by mouth 2 (two) times daily. 09/03/20   Ezekiel Slocumb, DO  finasteride (PROSCAR) 5 MG tablet Take 5 mg by mouth daily. 04/17/20   [provider]  gabapentin (NEURONTIN) 300 MG capsule Take 1 capsule (300 mg total) by mouth 2 (two) times daily. 10/06/20 01/04/21  Gillis Santa, MD  HYDROcodone-acetaminophen (NORCO/VICODIN) 5-325 MG tablet Take 1-2 tablets by mouth every 4 (four) hours as needed for moderate pain or  severe pain. 09/03/20   Ezekiel Slocumb, DO  meloxicam (MOBIC) 15 MG tablet Take 15 mg by mouth daily. 04/17/20   [provider]  metFORMIN (GLUCOPHAGE) 500 MG tablet Take 500 mg by mouth every morning. 03/26/20   [provider]  predniSONE (STERAPRED UNI-PAK 21 TAB) 10 MG (21) TBPK tablet Take six tablets by mouth the first two days. Decrease by one tablet every two days until medication runs out. 10/25/20   Lannie Fields, PA-C  sertraline (ZOLOFT) 50 MG tablet Take 50 mg by mouth daily.    [provider]  sildenafil (REVATIO) 20 MG tablet Take 20 mg by mouth daily as needed. 03/15/20   [provider]    Allergies Morphine and related  Family History  Problem Relation Age of Onset   Stroke Mother    Breast cancer Mother    Bladder Cancer Neg Hx    Kidney cancer Neg Hx    Prostate cancer Neg Hx     Social History Social History   Tobacco Use   Smoking status: Current Every Day Smoker    Packs/day: 0.50    Types: Cigarettes   Smokeless tobacco: Never Used  Vaping Use   Vaping Use: Never used  Substance Use Topics   Alcohol use: Yes    Comment: occasionally    Drug use: Never     Review of Systems  Constitutional: No fever/chills Eyes:  No discharge ENT: No upper respiratory complaints. Respiratory: no cough. No SOB/ use of accessory muscles to breath Gastrointestinal:   No nausea, no vomiting.  No diarrhea.  No constipation. Musculoskeletal: Patient has posterior right knee pain.  Skin: Negative for rash, abrasions, lacerations, ecchymosis.    ____________________________________________   PHYSICAL EXAM:  VITAL SIGNS: ED Triage Vitals  Enc Vitals Group     BP 10/25/20 1558 125/69     Pulse Rate 10/25/20 1558 (!) 105     Resp 10/25/20 1558 (!) 24     Temp 10/25/20 1558 98.4 F (36.9 C)     Temp Source 10/25/20 1558 Oral     SpO2 10/25/20 1558 97 %     Weight 10/25/20 1559 242 lb (109.8 kg)     Height  10/25/20 1559 6\' 3"  (1.905 m)     Head Circumference --      Peak Flow --      Pain Score 10/25/20 1558 10     Pain Loc --      Pain Edu? --      Excl. in [place]? --      Constitutional: Alert and oriented. Well appearing and in no acute distress. Eyes: Conjunctivae are normal. PERRL. EOMI. Head: Atraumatic. Cardiovascular: Normal rate, regular rhythm. Normal S1 and S2.  Good peripheral circulation. Respiratory: Normal respiratory effort without tachypnea or retractions. Lungs CTAB. Good air entry to the bases with no decreased or absent breath sounds Gastrointestinal: Bowel sounds x 4 quadrants. Soft and nontender to palpation. No guarding or rigidity. No distention. Musculoskeletal: Full range of motion to all extremities. No obvious deformities noted.  Patient has a positive straight leg raise test  on the right.  No deficits with provocative testing at the right knee.  Palpable dorsalis pedis pulse, right.  Capillary refill less than 2 seconds bilaterally and symmetrically. Neurologic:  Normal for age. No gross focal neurologic deficits are appreciated.  Skin:  Skin is warm, dry and intact. No rash noted. Psychiatric: Mood and affect are normal for age. Speech and behavior are normal.   ____________________________________________   LABS (all labs ordered are listed, but only abnormal results are displayed)  Labs Reviewed - No data to display ____________________________________________  EKG   ____________________________________________  RADIOLOGY Unk Pinto, personally viewed and evaluated these images (plain radiographs) as part of my medical decision making, as well as reviewing the written report by the radiologist.    US Venous Img Lower Unilateral Right  Result Date: 10/25/2020 CLINICAL DATA:  Knee pain. EXAM: RIGHT LOWER EXTREMITY VENOUS DOPPLER ULTRASOUND TECHNIQUE: Gray-scale sonography with compression, as well as color and duplex ultrasound, were performed  to evaluate the deep venous system(s) from the level of the common femoral vein through the popliteal and proximal calf veins. COMPARISON:  None. FINDINGS: VENOUS Normal compressibility of the common femoral, superficial femoral, and popliteal veins, as well as the visualized calf veins. Visualized portions of profunda femoral vein and great saphenous vein unremarkable. No filling defects to suggest DVT on grayscale or color Doppler imaging. Doppler waveforms show normal direction of venous flow, normal respiratory plasticity and response to augmentation. Limited views of the contralateral common femoral vein are unremarkable. OTHER None. Limitations: none IMPRESSION: Negative. Electronically Signed   By: Fidela Salisbury M.D.   On: 10/25/2020 17:33   DG Knee Complete 4 Views Right  Result Date: 10/25/2020 CLINICAL DATA:  64 year old male with right knee pain. EXAM: RIGHT KNEE - COMPLETE 4+ VIEW COMPARISON:  None. FINDINGS: There is no acute fracture or dislocation. The bones are mildly osteopenic. Mild arthritic changes of the knee. No joint effusion. Soft tissues are unremarkable. IMPRESSION: No acute fracture or dislocation. Electronically Signed   By: Anner Crete M.D.   On: 10/25/2020 18:05    ____________________________________________    PROCEDURES  Procedure(s) performed:     Procedures     Medications - No data to display   ____________________________________________   INITIAL IMPRESSION / ASSESSMENT AND PLAN / ED COURSE  Pertinent labs & imaging results that were available during my care of the patient were reviewed by me and considered in my medical decision making (see chart for details).      Assessment and Plan;  Right knee pain:  64 year old male presents to the emergency department with posterior right knee pain.  Patient was mildly tachycardic at triage.  Patient is on supplemental oxygen at home at 3 L.  Differential diagnosis includes lumbar  radiculopathy, DVT, arthritis of the right knee...  X-ray of the right knee reveals no bony abnormality.  There is no evidence of DVT on venous ultrasound.  Patient was discharged with a steroid taper and advised to follow-up with pain management.  Return precautions were given to return with new or worsening symptoms    ____________________________________________  FINAL CLINICAL IMPRESSION(S) / ED DIAGNOSES  Final diagnoses:  Lumbar radiculopathy      NEW MEDICATIONS STARTED DURING THIS VISIT:  ED Discharge Orders         Ordered    predniSONE (STERAPRED UNI-PAK 21 TAB) 10 MG (21) TBPK tablet        10/25/20 1812  This chart was dictated using voice recognition software/Dragon. Despite best efforts to proofread, errors can occur which can change the meaning. Any change was purely unintentional.     Lannie Fields, PA-C 10/25/20 1854    Nena Polio, MD 10/25/20 [phone]

## 2020-10-26 ENCOUNTER — Telehealth: Payer: Self-pay

## 2020-10-26 ENCOUNTER — Encounter: Payer: Medicare HMO | Admitting: Student in an Organized Health Care Education/Training Program

## 2020-10-26 NOTE — Telephone Encounter (Signed)
Contacted patient for lung CT screening clinic based on referral from Dr. Ginette Pitman. Message left with patient to call Burgess Estelle, lung navigator to schedule CT scan.

## 2020-11-02 ENCOUNTER — Telehealth: Payer: Self-pay

## 2020-11-02 DIAGNOSIS — Z87891 Personal history of nicotine dependence: Secondary | ICD-10-CM

## 2020-11-02 DIAGNOSIS — Z122 Encounter for screening for malignant neoplasm of respiratory organs: Secondary | ICD-10-CM

## 2020-11-02 NOTE — Telephone Encounter (Signed)
Contacted patient for lung CT screening clinic based on referral from Dr. Ginette Pitman.  Message left for patient to call Burgess Estelle, lung navigator to schedule CT scan.

## 2020-11-03 NOTE — Addendum Note (Signed)
Addended by: Lieutenant Diego on: 11/03/2020 12:56 PM   Modules accepted: Orders

## 2020-11-03 NOTE — Telephone Encounter (Signed)
Received referral for initial lung cancer screening scan. Contacted patient and obtained smoking history,(current 50 pack year) as well as answering questions related to screening process. Patient denies signs of lung cancer such as weight loss or hemoptysis. Patient denies comorbidity that would prevent curative treatment if lung cancer were found. Patient is scheduled for shared decision making visit and CT scan on 12/01/20 at 145pm.

## 2020-11-11 ENCOUNTER — Ambulatory Visit
Payer: Medicare HMO | Attending: Student in an Organized Health Care Education/Training Program | Admitting: Student in an Organized Health Care Education/Training Program

## 2020-11-11 ENCOUNTER — Encounter: Payer: Self-pay | Admitting: Student in an Organized Health Care Education/Training Program

## 2020-11-11 ENCOUNTER — Other Ambulatory Visit: Payer: Self-pay

## 2020-11-11 VITALS — BP 138/94 | HR 103 | Temp 97.2°F | Resp 18 | Ht 75.0 in | Wt 240.0 lb

## 2020-11-11 DIAGNOSIS — G894 Chronic pain syndrome: Secondary | ICD-10-CM | POA: Insufficient documentation

## 2020-11-11 DIAGNOSIS — M47816 Spondylosis without myelopathy or radiculopathy, lumbar region: Secondary | ICD-10-CM | POA: Insufficient documentation

## 2020-11-11 DIAGNOSIS — M5136 Other intervertebral disc degeneration, lumbar region: Secondary | ICD-10-CM | POA: Insufficient documentation

## 2020-11-11 NOTE — Progress Notes (Signed)
Safety precautions to be maintained throughout the outpatient stay will include: orient to surroundings, keep bed in low position, maintain call bell within reach at all times, provide assistance with transfer out of bed and ambulation.  

## 2020-11-11 NOTE — Progress Notes (Signed)
PROVIDER NOTE: Information contained herein reflects review and annotations entered in association with encounter. Interpretation of such information and data should be left to medically-trained personnel. Information provided to patient can be located elsewhere in the medical record under "Patient Instructions". Document created using STT-dictation technology, any transcriptional errors that may result from process are unintentional.    Patient: Ernest [name]  Service Category: E/M  Provider: Gillis Santa, MD  DOB: 02-08-1956  DOS: 11/11/2020  Specialty: Interventional Pain Management  MRN: [id number]  Setting: Ambulatory outpatient  PCP: Tracie Harrier, MD  Type: Established Patient    Referring Provider: Tracie Harrier, MD  Location: Office  Delivery: Face-to-face     HPI  [name], a 64 y.o. year old male, is here today because of his Lumbar facet arthropathy [M47.816]. [name] primary complain today is Back Pain (lower) Last encounter: My last encounter with him was on 10/20/2020. Pertinent problems: [name] has Lumbar facet arthropathy; Spinal stenosis, lumbar region, with neurogenic claudication; Chronic radicular lumbar pain; Lumbar degenerative disc disease; and Chronic pain syndrome on their pertinent problem list. Pain Assessment: Severity of Chronic pain is reported as a 10-Worst pain ever/10. Location: Back Lower/down right leg to knee. Onset: More than a month ago. Quality: Aching, Sharp, Shooting. Timing: Constant. Modifying factor(s): heat. Vitals:  height is '6\' 3"'  (1.905 m) and weight is 240 lb (108.9 kg). His temporal temperature is 97.2 F (36.2 C) (abnormal). His blood pressure is 138/94 (abnormal) and his pulse is 103 (abnormal). His respiration is 18 and oxygen saturation is 100%.   Reason for encounter: post-procedure assessment.   Post-Procedure Evaluation  Procedure (10/20/2020): Type: Lumbar Facet, Medial Branch Block(s) #2  Primary Purpose:  Diagnostic Region: Posterolateral Lumbosacral Spine Level: L3, L4, L5,  Medial Branch Level(s). Injecting these levels blocks the L3-4, L4-5 lumbar facet joints. Laterality: Bilateral  Sedation: Please see nurses note.  Effectiveness during initial hour after procedure(Ultra-Short Term Relief): 100%  Local anesthetic used: Long-acting (4-6 hours) Effectiveness: Defined as any analgesic benefit obtained secondary to the administration of local anesthetics. This carries significant diagnostic value as to the etiological location, or anatomical origin, of the pain. Duration of benefit is expected to coincide with the duration of the local anesthetic used.  Effectiveness during initial 4-6 hours after procedure(Short-Term Relief): 100%  Long-term benefit: Defined as any relief past the pharmacologic duration of the local anesthetics.  Effectiveness past the initial 6 hours after procedure(Long-Term Relief): 60% for 3 days  Current benefits: Defined as benefit that persist at this time.   Analgesia:  Back to baseline Function: Back to baseline ROM: Back to baseline   ROS  Constitutional: Denies any fever or chills Gastrointestinal: No reported hemesis, hematochezia, vomiting, or acute GI distress Musculoskeletal: +LBP Neurological: No reported episodes of acute onset apraxia, aphasia, dysarthria, agnosia, amnesia, paralysis, loss of coordination, or loss of consciousness  Medication Review  HYDROcodone-acetaminophen, albuterol, budesonide, budesonide-formoterol, dextromethorphan-guaiFENesin, finasteride, gabapentin, meloxicam, metFORMIN, predniSONE, sertraline, and sildenafil  History Review  Allergy: [name] is allergic to morphine and related. Drug: [name]  reports no history of drug use. Alcohol:  reports current alcohol use. Tobacco:  reports that he has been smoking cigarettes. He has been smoking about 0.50 packs per day. He has never used smokeless tobacco. Social: Mr.  Ernest [name]  reports that he has been smoking cigarettes. He has been smoking about 0.50 packs per day. He has never used smokeless tobacco. He reports current alcohol use. He reports that he does  not use drugs. Medical:  has a past medical history of Acute on chronic respiratory failure with hypoxia ([place]) (05/25/2017), Acute respiratory failure with hypoxia ([place]) (05/25/2017), COPD (chronic obstructive pulmonary disease) ([place]), COPD with acute bronchitis ([place]) (05/25/2017), COPD with acute exacerbation ([place]) (05/25/2017), Diabetes mellitus without complication ([place]), Leukocytosis (05/25/2017), and Prostate cancer ([place]). Surgical: [name]  has a past surgical history that includes Testicle surgery. Family: family history includes Breast cancer in his mother; Stroke in his mother.  Laboratory Chemistry Profile   Renal Lab Results  Component Value Date   BUN 21 09/03/2020   CREATININE 0.94 09/03/2020   GFRAA >60 09/03/2020   GFRNONAA >60 09/03/2020     Hepatic Lab Results  Component Value Date   AST 19 09/02/2020   ALT 18 09/02/2020   ALBUMIN 3.8 09/02/2020   ALKPHOS 63 09/02/2020     Electrolytes Lab Results  Component Value Date   NA 138 09/03/2020   K 4.3 09/03/2020   CL 106 09/03/2020   CALCIUM 9.2 09/03/2020   MG 2.4 05/28/2013     Bone No results found for: VD25OH, VD125OH2TOT, MO2947ML4, YT0354SF6, 25OHVITD1, 25OHVITD2, 25OHVITD3, TESTOFREE, TESTOSTERONE   Inflammation (CRP: Acute Phase) (ESR: Chronic Phase) Lab Results  Component Value Date   LATICACIDVEN 3.1 ([place]) 07/30/2018       Note: Above Lab results reviewed.  Recent Imaging Review  DG Knee Complete 4 Views Right CLINICAL DATA:  63 year old male with right knee pain.  EXAM: RIGHT KNEE - COMPLETE 4+ VIEW  COMPARISON:  None.  FINDINGS: There is no acute fracture or dislocation. The bones are mildly osteopenic. Mild arthritic changes of the knee. No joint effusion. Soft tissues are  unremarkable.  IMPRESSION: No acute fracture or dislocation.  Electronically Signed   By: Anner Crete M.D.   On: 10/25/2020 18:05 US Venous Img Lower Unilateral Right CLINICAL DATA:  Knee pain.  EXAM: RIGHT LOWER EXTREMITY VENOUS DOPPLER ULTRASOUND  TECHNIQUE: Gray-scale sonography with compression, as well as color and duplex ultrasound, were performed to evaluate the deep venous system(s) from the level of the common femoral vein through the popliteal and proximal calf veins.  COMPARISON:  None.  FINDINGS: VENOUS  Normal compressibility of the common femoral, superficial femoral, and popliteal veins, as well as the visualized calf veins. Visualized portions of profunda femoral vein and great saphenous vein unremarkable. No filling defects to suggest DVT on grayscale or color Doppler imaging. Doppler waveforms show normal direction of venous flow, normal respiratory plasticity and response to augmentation.  Limited views of the contralateral common femoral vein are unremarkable.  OTHER  None.  Limitations: none  IMPRESSION: Negative.  Electronically Signed   By: Fidela Salisbury M.D.   On: 10/25/2020 17:33 Note: Reviewed        Physical Exam  General appearance: Well nourished, well developed, and well hydrated. In no apparent acute distress Mental status: Alert, oriented x 3 (person, place, & time)       Respiratory: Oxygen-dependent COPD Eyes: PERLA Vitals: BP (!) 138/94   Pulse (!) 103   Temp (!) 97.2 F (36.2 C) (Temporal)   Resp 18   Ht '6\' 3"'  (1.905 m)   Wt 240 lb (108.9 kg)   SpO2 100% Comment: on 3L O2 from home  BMI 30.00 kg/m  BMI: Estimated body mass index is 30 kg/m as calculated from the following:   Height as of this encounter: '6\' 3"'  (1.905 m).   Weight as of this encounter: 240 lb (  108.9 kg). Ideal: Ideal body weight: 84.5 kg (186 lb 4.6 oz) Adjusted ideal body weight: 94.2 kg (207 lb 12.4 oz)  Lumbar Exam  Skin & Axial  Inspection:No masses, redness, or swelling Alignment:Symmetrical Functional KGM:WNUU restricted ROMaffecting both sides Stability:No instability detected Muscle Tone/Strength:Functionally intact. No obvious neuro-muscular anomalies detected. Sensory (Neurological):Musculoskeletal pain pattern Palpation:No palpable anomalies Provocative Tests: Hyperextension/rotation test:(+)bilaterally for facet joint pain. Lumbar quadrant test (Kemp's test):(+)bilaterally for facet joint pain. Lateral bending test:(+)due to pain. Patrick's Maneuver:(+)due to pain FABER* test:deferred today S-I anterior distraction/compression test:deferred today S-I lateral compression test:deferred today S-I Thigh-thrust test:deferred today S-I Gaenslen's test:deferred today *(Flexion, ABduction and External Rotation)  Gait & Posture Assessment  Ambulation:Patient came in today in a wheel chair Gait:Antalgic gait (limping) Posture:Difficulty standing up straight, due to pain  Lower Extremity Exam    Side:Right lower extremity  Side:Left lower extremity  Stability:No instability observed  Stability:No instability observed  Skin & Extremity Inspection:Skin color, temperature, and hair growth are WNL. No peripheral edema or cyanosis. No masses, redness, swelling, asymmetry, or associated skin lesions. No contractures.  Skin & Extremity Inspection:Skin color, temperature, and hair growth are WNL. No peripheral edema or cyanosis. No masses, redness, swelling, asymmetry, or associated skin lesions. No contractures.  Functional VOZ:DGUY restricted ROMfor hip and knee joints   Functional QIH:[id number] ROM   Muscle Tone/Strength:Functionally intact. No obvious neuro-muscular anomalies detected.  Muscle Tone/Strength:Functionally intact. No obvious neuro-muscular anomalies  detected.  Sensory (Neurological):Neurogenic pain patternand musculoskeletal   Sensory (Neurological):Unimpaired  DTR: Patellar:deferred today Achilles:deferred today Plantar:deferred today  DTR: Patellar:deferred today Achilles:deferred today Plantar:deferred today  Palpation:No palpable anomalies  Palpation:No palpable anomalies     Assessment   Status Diagnosis  Responding Responding Controlled 1. Lumbar facet arthropathy   2. Lumbar spondylosis   3. Lumbar degenerative disc disease   4. Chronic pain syndrome       Plan of Care   [name] has a current medication list which includes the following long-term medication(s): albuterol, budesonide-formoterol, gabapentin, metformin, and sertraline.  1. Lumbar facet arthropathy - Status post 2 diagnostic lumbar facet medial branch nerve blocks bilaterally at L3, L4, L5 done on 09/14/2020 & 10/14/2020 with greater than 50% pain relief for at least 3 days.  Discussed neck steps which could include lumbar radiofrequency ablation for the purpose of obtaining longer-term pain relief.  Risks and benefits reviewed and patient would like to proceed.  We will start with the right side first followed by the left side. - Radiofrequency,Lumbar; Future: Right L3, L4, L5 RFA - Radiofrequency,Lumbar; Future: Left L3, L4, L5 RFA 2 weeks after right   Orders:  Orders Placed This Encounter  Procedures  . Radiofrequency,Lumbar    Standing Status:   Future    Standing Expiration Date:   05/12/2021    Scheduling Instructions:     Side(s): left     Level(s): L3, L4, L5,  Medial Branch Nerve(s)     Sedation: With Sedation     Scheduling Timeframe: 2 weeks after right    Order Specific Question:   Where will this procedure be performed?    Answer:   [facility]  . Radiofrequency,Lumbar    Standing Status:   Future    Standing Expiration Date:   05/12/2021    Scheduling Instructions:     Side(s): RIGHT      Level(s):L3, L4, L5,  Medial Branch Nerve(s)     Sedation: With Sedation     Scheduling Timeframe: As soon as  pre-approved    Order Specific Question:   Where will this procedure be performed?    Answer:   [facility]   Follow-up plan:   Return in about 3 weeks (around 12/02/2020) for R L3,4,5 RFA with sedation.     Status post bilateral L3, L4, L5 facet medial branch nerve block on 09/14/2020, 80% pain relief for 5 days with improvement, 10/14/2020 60% pain relief for approximately 3 days.  Plan for lumbar RFA    Recent Visits Date Type Provider Dept  10/14/20 Procedure visit Gillis Santa, MD [facility]  10/06/20 Office Visit Gillis Santa, MD [facility]  09/29/20 Telemedicine Gillis Santa, MD [facility]  09/14/20 Procedure visit Gillis Santa, MD [facility]  09/08/20 Office Visit Gillis Santa, MD [facility]  Showing recent visits within past 90 days and meeting all other requirements Today's Visits Date Type Provider Dept  11/11/20 Office Visit Gillis Santa, MD [facility]  Showing today's visits and meeting all other requirements Future Appointments Date Type Provider Dept  12/31/20 Appointment Gillis Santa, MD [facility]  Showing future appointments within next 90 days and meeting all other requirements  I discussed the assessment and treatment plan with the patient. The patient was provided an opportunity to ask questions and all were answered. The patient agreed with the plan and demonstrated an understanding of the instructions.  Patient advised to call back or seek an in-person evaluation if the symptoms or condition worsens.  Duration of encounter: 9mnutes.  Note by: BGillis Santa MD Date: 11/11/2020; Time: 2:36 PM

## 2020-11-11 NOTE — Patient Instructions (Signed)
GENERAL RISKS AND COMPLICATIONS  What are the risk, side effects and possible complications? Generally speaking, most procedures are safe.  However, with any procedure there are risks, side effects, and the possibility of complications.  The risks and complications are dependent upon the sites that are lesioned, or the type of nerve block to be performed.  The closer the procedure is to the spine, the more serious the risks are.  Great care is taken when placing the radio frequency needles, block needles or lesioning probes, but sometimes complications can occur. 1. Infection: Any time there is an injection through the skin, there is a risk of infection.  This is why sterile conditions are used for these blocks.  There are four possible types of infection. 1. Localized skin infection. 2. Central Nervous System Infection-This can be in the form of Meningitis, which can be deadly. 3. Epidural Infections-This can be in the form of an epidural abscess, which can cause pressure inside of the spine, causing compression of the spinal cord with subsequent paralysis. This would require an emergency surgery to decompress, and there are no guarantees that the patient would recover from the paralysis. 4. Discitis-This is an infection of the intervertebral discs.  It occurs in about 1% of discography procedures.  It is difficult to treat and it may lead to surgery.        2. Pain: the needles have to go through skin and soft tissues, will cause soreness.       3. Damage to internal structures:  The nerves to be lesioned may be near blood vessels or    other nerves which can be potentially damaged.       4. Bleeding: Bleeding is more common if the patient is taking blood thinners such as  aspirin, Coumadin, Ticiid, Plavix, etc., or if he/she have some genetic predisposition  such as hemophilia. Bleeding into the spinal canal can cause compression of the spinal  cord with subsequent paralysis.  This would require an  emergency surgery to  decompress and there are no guarantees that the patient would recover from the  paralysis.       5. Pneumothorax:  Puncturing of a lung is a possibility, every time a needle is introduced in  the area of the chest or upper back.  Pneumothorax refers to free air around the  collapsed lung(s), inside of the thoracic cavity (chest cavity).  Another two possible  complications related to a similar event would include: Hemothorax and Chylothorax.   These are variations of the Pneumothorax, where instead of air around the collapsed  lung(s), you may have blood or chyle, respectively.       6. Spinal headaches: They may occur with any procedures in the area of the spine.       7. Persistent CSF (Cerebro-Spinal Fluid) leakage: This is a rare problem, but may occur  with prolonged intrathecal or epidural catheters either due to the formation of a fistulous  track or a dural tear.       8. Nerve damage: By working so close to the spinal cord, there is always a possibility of  nerve damage, which could be as serious as a permanent spinal cord injury with  paralysis.       9. Death:  Although rare, severe deadly allergic reactions known as "Anaphylactic  reaction" can occur to any of the medications used.      10. Worsening of the symptoms:  We can always make thing worse.    What are the chances of something like this happening? Chances of any of this occuring are extremely low.  By statistics, you have more of a chance of getting killed in a motor vehicle accident: while driving to the hospital than any of the above occurring .  Nevertheless, you should be aware that they are possibilities.  In general, it is similar to taking a shower.  Everybody knows that you can slip, hit your head and get killed.  Does that mean that you should not shower again?  Nevertheless always keep in mind that statistics do not mean anything if you happen to be on the wrong side of them.  Even if a procedure has a 1  (one) in a 1,000,000 (million) chance of going wrong, it you happen to be that one..Also, keep in mind that by statistics, you have more of a chance of having something go wrong when taking medications.  Who should not have this procedure? If you are on a blood thinning medication (e.g. Coumadin, Plavix, see list of "Blood Thinners"), or if you have an active infection going on, you should not have the procedure.  If you are taking any blood thinners, please inform your physician.  How should I prepare for this procedure?  Do not eat or drink anything at least six hours prior to the procedure.  Bring a driver with you .  It cannot be a taxi.  Come accompanied by an adult that can drive you back, and that is strong enough to help you if your legs get weak or numb from the local anesthetic.  Take all of your medicines the morning of the procedure with just enough water to swallow them.  If you have diabetes, make sure that you are scheduled to have your procedure done first thing in the morning, whenever possible.  If you have diabetes, take only half of your insulin dose and notify our nurse that you have done so as soon as you arrive at the clinic.  If you are diabetic, but only take blood sugar pills (oral hypoglycemic), then do not take them on the morning of your procedure.  You may take them after you have had the procedure.  Do not take aspirin or any aspirin-containing medications, at least eleven (11) days prior to the procedure.  They may prolong bleeding.  Wear loose fitting clothing that may be easy to take off and that you would not mind if it got stained with Betadine or blood.  Do not wear any jewelry or perfume  Remove any nail coloring.  It will interfere with some of our monitoring equipment.  NOTE: Remember that this is not meant to be interpreted as a complete list of all possible complications.  Unforeseen problems may occur.  BLOOD THINNERS The following drugs  contain aspirin or other products, which can cause increased bleeding during surgery and should not be taken for 2 weeks prior to and 1 week after surgery.  If you should need take something for relief of minor pain, you may take acetaminophen which is found in Tylenol,m Datril, Anacin-3 and Panadol. It is not blood thinner. The products listed below are.  Do not take any of the products listed below in addition to any listed on your instruction sheet.  A.P.C or A.P.C with Codeine Codeine Phosphate Capsules #3 Ibuprofen Ridaura  ABC compound Congesprin Imuran rimadil  Advil Cope Indocin Robaxisal  Alka-Seltzer Effervescent Pain Reliever and Antacid Coricidin or Coricidin-D  Indomethacin Rufen    Alka-Seltzer plus Cold Medicine Cosprin Ketoprofen S-A-C Tablets  Anacin Analgesic Tablets or Capsules Coumadin Korlgesic Salflex  Anacin Extra Strength Analgesic tablets or capsules CP-2 Tablets Lanoril Salicylate  Anaprox Cuprimine Capsules Levenox Salocol  Anexsia-D Dalteparin Magan Salsalate  Anodynos Darvon compound Magnesium Salicylate Sine-off  Ansaid Dasin Capsules Magsal Sodium Salicylate  Anturane Depen Capsules Marnal Soma  APF Arthritis pain formula Dewitt's Pills Measurin Stanback  Argesic Dia-Gesic Meclofenamic Sulfinpyrazone  Arthritis Bayer Timed Release Aspirin Diclofenac Meclomen Sulindac  Arthritis pain formula Anacin Dicumarol Medipren Supac  Analgesic (Safety coated) Arthralgen Diffunasal Mefanamic Suprofen  Arthritis Strength Bufferin Dihydrocodeine Mepro Compound Suprol  Arthropan liquid Dopirydamole Methcarbomol with Aspirin Synalgos  ASA tablets/Enseals Disalcid Micrainin Tagament  Ascriptin Doan's Midol Talwin  Ascriptin A/D Dolene Mobidin Tanderil  Ascriptin Extra Strength Dolobid Moblgesic Ticlid  Ascriptin with Codeine Doloprin or Doloprin with Codeine Momentum Tolectin  Asperbuf Duoprin Mono-gesic Trendar  Aspergum Duradyne Motrin or Motrin IB Triminicin  Aspirin  plain, buffered or enteric coated Durasal Myochrisine Trigesic  Aspirin Suppositories Easprin Nalfon Trillsate  Aspirin with Codeine Ecotrin Regular or Extra Strength Naprosyn Uracel  Atromid-S Efficin Naproxen Ursinus  Auranofin Capsules Elmiron Neocylate Vanquish  Axotal Emagrin Norgesic Verin  Azathioprine Empirin or Empirin with Codeine Normiflo Vitamin E  Azolid Emprazil Nuprin Voltaren  Bayer Aspirin plain, buffered or children's or timed BC Tablets or powders Encaprin Orgaran Warfarin Sodium  Buff-a-Comp Enoxaparin Orudis Zorpin  Buff-a-Comp with Codeine Equegesic Os-Cal-Gesic   Buffaprin Excedrin plain, buffered or Extra Strength Oxalid   Bufferin Arthritis Strength Feldene Oxphenbutazone   Bufferin plain or Extra Strength Feldene Capsules Oxycodone with Aspirin   Bufferin with Codeine Fenoprofen Fenoprofen Pabalate or Pabalate-SF   Buffets II Flogesic Panagesic   Buffinol plain or Extra Strength Florinal or Florinal with Codeine Panwarfarin   Buf-Tabs Flurbiprofen Penicillamine   Butalbital Compound Four-way cold tablets Penicillin   Butazolidin Fragmin Pepto-Bismol   Carbenicillin Geminisyn Percodan   Carna Arthritis Reliever Geopen Persantine   Carprofen Gold's salt Persistin   Chloramphenicol Goody's Phenylbutazone   Chloromycetin Haltrain Piroxlcam   Clmetidine heparin Plaquenil   Cllnoril Hyco-pap Ponstel   Clofibrate Hydroxy chloroquine Propoxyphen         Before stopping any of these medications, be sure to consult the physician who ordered them.  Some, such as Coumadin (Warfarin) are ordered to prevent or treat serious conditions such as "deep thrombosis", "pumonary embolisms", and other heart problems.  The amount of time that you may need off of the medication may also vary with the medication and the reason for which you were taking it.  If you are taking any of these medications, please make sure you notify your pain physician before you undergo any  procedures.         Moderate Conscious Sedation, Adult Sedation is the use of medicines to promote relaxation and relieve discomfort and anxiety. Moderate conscious sedation is a type of sedation. Under moderate conscious sedation, you are less alert than normal, but you are still able to respond to instructions, touch, or both. Moderate conscious sedation is used during short medical and dental procedures. It is milder than deep sedation, which is a type of sedation under which you cannot be easily woken up. It is also milder than general anesthesia, which is the use of medicines to make you unconscious. Moderate conscious sedation allows you to return to your regular activities sooner. Tell a health care provider about:  Any allergies you have.  All medicines you are  taking, including vitamins, herbs, eye drops, creams, and over-the-counter medicines.  Use of steroids (by mouth or creams).  Any problems you or family members have had with sedatives and anesthetic medicines.  Any blood disorders you have.  Any surgeries you have had.  Any medical conditions you have, such as sleep apnea.  Whether you are pregnant or may be pregnant.  Any use of cigarettes, alcohol, marijuana, or street drugs. What are the risks? Generally, this is a safe procedure. However, problems may occur, including:  Getting too much medicine (oversedation).  Nausea.  Allergic reaction to medicines.  Trouble breathing. If this happens, a breathing tube may be used to help with breathing. It will be removed when you are awake and breathing on your own.  Heart trouble.  Lung trouble. What happens before the procedure? Staying hydrated Follow instructions from your health care provider about hydration, which may include:  Up to 2 hours before the procedure - you may continue to drink clear liquids, such as water, clear fruit juice, black coffee, and plain tea. Eating and drinking  restrictions Follow instructions from your health care provider about eating and drinking, which may include:  8 hours before the procedure - stop eating heavy meals or foods such as meat, fried foods, or fatty foods.  6 hours before the procedure - stop eating light meals or foods, such as toast or cereal.  6 hours before the procedure - stop drinking milk or drinks that contain milk.  2 hours before the procedure - stop drinking clear liquids. Medicine Ask your health care provider about:  Changing or stopping your regular medicines. This is especially important if you are taking diabetes medicines or blood thinners.  Taking medicines such as aspirin and ibuprofen. These medicines can thin your blood. Do not take these medicines before your procedure if your health care provider instructs you not to.  Tests and exams  You will have a physical exam.  You may have blood tests done to show: ? How well your kidneys and liver are working. ? How well your blood can clot. General instructions  Plan to have someone take you home from the hospital or clinic.  If you will be going home right after the procedure, plan to have someone with you for 24 hours. What happens during the procedure?  An IV tube will be inserted into one of your veins.  Medicine to help you relax (sedative) will be given through the IV tube.  The medical or dental procedure will be performed. What happens after the procedure?  Your blood pressure, heart rate, breathing rate, and blood oxygen level will be monitored often until the medicines you were given have worn off.  Do not drive for 24 hours. This information is not intended to replace advice given to you by your health care provider. Make sure you discuss any questions you have with your health care provider. Document Revised: 11/10/2017 Document Reviewed: 03/19/2016 Elsevier Patient Education  2020 [place]. Radiofrequency  Lesioning Radiofrequency lesioning is a procedure that is performed to relieve pain. The procedure is often used for back, neck, or arm pain. Radiofrequency lesioning involves the use of a machine that creates radio waves to make heat. During the procedure, the heat is applied to the nerve that carries the pain signal. The heat damages the nerve and interferes with the pain signal. Pain relief usually starts about 2 weeks after the procedure and lasts for 6 months to 1 year. You will  be awake during the procedure. You will need to be able to talk with the health care provider during the procedure. Tell a health care provider about:  Any allergies you have.  All medicines you are taking, including vitamins, herbs, eye drops, creams, and over-the-counter medicines.  Any problems you or family members have had with anesthetic medicines.  Any blood disorders you have.  Any surgeries you have had.  Any medical conditions you have or have had.  Whether you are pregnant or may be pregnant. What are the risks? Generally, this is a safe procedure. However, problems may occur, including:  Pain or soreness at the injection site.  Allergic reaction to medicines given during the procedure.  Bleeding.  Infection at the injection site.  Damage to nerves or blood vessels. What happens before the procedure? Staying hydrated Follow instructions from your health care provider about hydration, which may include:  Up to 2 hours before the procedure - you may continue to drink clear liquids, such as water, clear fruit juice, black coffee, and plain tea. Eating and drinking Follow instructions from your health care provider about eating and drinking, which may include:  8 hours before the procedure - stop eating heavy meals or foods, such as meat, fried foods, or fatty foods.  6 hours before the procedure - stop eating light meals or foods, such as toast or cereal.  6 hours before the procedure -  stop drinking milk or drinks that contain milk.  2 hours before the procedure - stop drinking clear liquids. Medicines Ask your health care provider about:  Changing or stopping your regular medicines. This is especially important if you are taking diabetes medicines or blood thinners.  Taking medicines such as aspirin and ibuprofen. These medicines can thin your blood. Do not take these medicines unless your health care provider tells you to take them.  Taking over-the-counter medicines, vitamins, herbs, and supplements. General instructions  Plan to have someone take you home from the hospital or clinic.  If you will be going home right after the procedure, plan to have someone with you for 24 hours.  Ask your health care provider what steps will be taken to help prevent infection. These may include: ? Removing hair at the procedure site. ? Washing skin with a germ-killing soap. ? Taking antibiotic medicine. What happens during the procedure?   An IV will be inserted into one of your veins.  You will be given one or more of the following: ? A medicine to help you relax (sedative). ? A medicine to numb the area (local anesthetic).  Your health care provider will insert a radiofrequency needle into the area to be treated. This is done with the help of a type of X-ray (fluoroscopy).  A wire that carries the radio waves (electrode) will be put through the radiofrequency needle.  An electrical pulse will be sent through the electrode to verify the correct nerve that is causing your pain. You will feel a tingling sensation, and you may have muscle twitching.  The tissue around the needle tip will be heated by an electric current that comes from the radiofrequency machine. This will numb the nerves.  The needle will be removed.  A bandage (dressing) will be put on the insertion area. The procedure may vary among health care providers and hospitals. What happens after the  procedure?  Your blood pressure, heart rate, breathing rate, and blood oxygen level will be monitored until you leave the hospital or  clinic.  Return to your normal activities as told by your health care provider. Ask your health care provider what activities are safe for you.  Do not drive for 24 hours if you were given a sedative during your procedure. Summary  Radiofrequency lesioning is a procedure that is performed to relieve pain. The procedure is often used for back, neck, or arm pain.  Radiofrequency lesioning involves the use of a machine that creates radio waves to make heat.  Plan to have someone take you home from the hospital or clinic. Do not drive for 24 hours if you were given a sedative during your procedure.  Return to your normal activities as told by your health care provider. Ask your health care provider what activities are safe for you. This information is not intended to replace advice given to you by your health care provider. Make sure you discuss any questions you have with your health care provider. Document Revised: 08/16/2018 Document Reviewed: 08/16/2018 Elsevier Patient Education  [place].

## 2020-11-17 ENCOUNTER — Encounter: Payer: Medicare HMO | Admitting: Physical Therapy

## 2020-11-19 ENCOUNTER — Encounter: Payer: Medicare HMO | Admitting: Physical Therapy

## 2020-12-01 ENCOUNTER — Ambulatory Visit
Admission: RE | Admit: 2020-12-01 | Discharge: 2020-12-01 | Disposition: A | Discharge status: Home / Self Care | Payer: Medicare HMO | Source: Ambulatory Visit | Attending: Oncology | Admitting: Oncology

## 2020-12-01 ENCOUNTER — Inpatient Hospital Stay: Payer: Medicare HMO | Attending: Oncology | Admitting: Oncology

## 2020-12-01 ENCOUNTER — Encounter: Payer: Self-pay | Admitting: Oncology

## 2020-12-01 ENCOUNTER — Other Ambulatory Visit: Payer: Self-pay

## 2020-12-01 DIAGNOSIS — Z122 Encounter for screening for malignant neoplasm of respiratory organs: Secondary | ICD-10-CM | POA: Diagnosis present

## 2020-12-01 DIAGNOSIS — Z87891 Personal history of nicotine dependence: Secondary | ICD-10-CM | POA: Diagnosis present

## 2020-12-01 NOTE — Progress Notes (Signed)
Virtual Visit via Video Note  I connected with [name]  on 12/01/20 at  1:45 PM EST by a video enabled telemedicine application and verified that I am speaking with the correct person using two identifiers.  Location: Patient: [facility] Provider: [facility]    I discussed the limitations of evaluation and management by telemedicine and the availability of in person appointments. The patient expressed understanding and agreed to proceed.  I discussed the assessment and treatment plan with the patient. The patient was provided an opportunity to ask questions and all were answered. The patient agreed with the plan and demonstrated an understanding of the instructions.   The patient was advised to call back or seek an in-person evaluation if the symptoms worsen or if the condition fails to improve as anticipated.   In accordance with CMS guidelines, patient has met eligibility criteria including age, absence of signs or symptoms of lung cancer.  Social History   Tobacco Use  . Smoking status: Current Every Day Smoker    Packs/day: 1.00    Years: 50.00    Pack years: 50.00    Types: Cigarettes  . Smokeless tobacco: Never Used  Vaping Use  . Vaping Use: Never used  Substance Use Topics  . Alcohol use: Yes    Comment: occasionally   . Drug use: Never      A shared decision-making session was conducted prior to the performance of CT scan. This includes one or more decision aids, includes benefits and harms of screening, follow-up diagnostic testing, over-diagnosis, false positive rate, and total radiation exposure.   Counseling on the importance of adherence to annual lung cancer LDCT screening, impact of co-morbidities, and ability or willingness to undergo diagnosis and treatment is imperative for compliance of the program.   Counseling on the importance of continued smoking cessation for former smokers; the importance of smoking cessation for current smokers, and information about tobacco  cessation interventions have been given to patient including [place] and [facility] programs.   Written order for lung cancer screening with LDCT has been given to the patient and any and all questions have been answered to the best of my abilities.    Yearly follow up will be coordinated by Burgess Estelle, Thoracic Navigator.  I provided 15 minutes of face-to-face video visit time during this encounter, and > 50% was spent counseling as documented under my assessment & plan.   Jacquelin Hawking, NP

## 2020-12-03 ENCOUNTER — Encounter: Payer: Self-pay | Admitting: *Deleted

## 2020-12-18 ENCOUNTER — Other Ambulatory Visit: Payer: Self-pay

## 2020-12-18 ENCOUNTER — Other Ambulatory Visit: Payer: Medicare HMO

## 2020-12-18 DIAGNOSIS — C61 Malignant neoplasm of prostate: Secondary | ICD-10-CM

## 2020-12-19 LAB — PSA: Prostate Specific Ag, Serum: 8.5 ng/mL — ABNORMAL HIGH (ref 0.0–4.0)

## 2020-12-22 ENCOUNTER — Other Ambulatory Visit: Payer: Self-pay

## 2020-12-22 ENCOUNTER — Ambulatory Visit (INDEPENDENT_AMBULATORY_CARE_PROVIDER_SITE_OTHER): Payer: Medicare HMO | Admitting: Urology

## 2020-12-22 ENCOUNTER — Encounter: Payer: Self-pay | Admitting: Urology

## 2020-12-22 VITALS — BP 145/90 | HR 94

## 2020-12-22 DIAGNOSIS — C61 Malignant neoplasm of prostate: Secondary | ICD-10-CM

## 2020-12-22 DIAGNOSIS — N401 Enlarged prostate with lower urinary tract symptoms: Secondary | ICD-10-CM

## 2020-12-22 DIAGNOSIS — R351 Nocturia: Secondary | ICD-10-CM | POA: Diagnosis not present

## 2020-12-22 NOTE — Progress Notes (Signed)
12/22/2020 2:07 PM   Ernest [name] 1956-03-21 [id number]  Referring provider: Tracie Harrier, MD [address] [facility] [place],  [place] [address]  Chief Complaint  Patient presents with  . Prostate Cancer    HPI: Ernest [name] is a 65 y.o. 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   He has multiple medical comorbidities and is a poor surgical candidate for both consideration of aloe procedure as well as treatment of his prostate cancer.  Patient was followed by Dr. Bernardo Heater in 2019 for surveillance regarding prostate cancer. 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  Today, his primary complaint is of back pain.  He is been working with Dr. Zollie Scale with injections and is considering a procedure to "deaden" the nerves to his back.  He continues to be on chronic finasteride.  He had to stop taking Flomax because he reports that it gave him terrible dreams.  He stopped immediately after taking this medication.  He does not notice a difference in his urinary symptoms after stopping this medication.  He reports that he gets up at least 4 times a night to urinate but does have some lower extremity swelling.  Overall, he can tolerate his urinary symptoms and this is not his primary focus today.  PSA Trend: 06/25/2018: 8.7.  06/19/2019: 10.21. 12/19/2019: 9.91. 02/04/2020: 10.45. 02/05/2020: 11.30. 12/18/20:  8.5     PMH: Past Medical History:  Diagnosis Date  . Acute on chronic respiratory failure with hypoxia ([place]) 05/25/2017  . Acute respiratory failure with hypoxia ([place]) 05/25/2017  . COPD (chronic obstructive pulmonary disease) ([place])   . COPD with acute bronchitis ([place]) 05/25/2017  . COPD with acute exacerbation ([place]) 05/25/2017  .  Diabetes mellitus without complication ([place])   . Leukocytosis 05/25/2017  . Prostate cancer [facility])     Surgical History: Past Surgical History:  Procedure Laterality Date  . TESTICLE SURGERY     undecended testicle repair    Home Medications:  Allergies as of 12/22/2020      Reactions   Morphine And Related Itching      Medication List       Accurate as of December 22, 2020  2:07 PM. If you have any questions, ask your nurse or doctor.        STOP taking these medications   dextromethorphan-guaiFENesin 30-600 MG 12hr tablet Commonly known as: [place] DM Stopped by: Hollice Espy, MD   HYDROcodone-acetaminophen 5-325 MG tablet Commonly known as: NORCO/VICODIN Stopped by: Hollice Espy, MD   predniSONE 10 MG (21) Tbpk tablet Commonly known as: STERAPRED UNI-PAK 21 TAB Stopped by: Hollice Espy, MD     TAKE these medications   albuterol 108 (90 Base) MCG/ACT inhaler Commonly known as: VENTOLIN HFA Inhale 2 puffs into the lungs every 4 (four) hours as needed for wheezing or shortness of breath.   budesonide 0.5 MG/2ML nebulizer solution Commonly known as: PULMICORT Take 0.5 mg by nebulization 2 (two) times daily.   budesonide-formoterol 160-4.5 MCG/ACT inhaler Commonly known as: Symbicort Inhale 2 puffs into the lungs 2 (two) times daily.   finasteride 5 MG tablet Commonly known as: PROSCAR Take 5 mg by mouth daily.   gabapentin 300 MG capsule Commonly known as: Neurontin Take 1 capsule (300 mg total) by mouth 2 (two) times daily.  ipratropium-albuterol 0.5-2.5 (3) MG/3ML Soln Commonly known as: DUONEB Take 3 mLs by nebulization 4 (four) times daily.   meloxicam 15 MG tablet Commonly known as: MOBIC Take 15 mg by mouth daily.   metFORMIN 500 MG tablet Commonly known as: GLUCOPHAGE Take 500 mg by mouth every morning.   sertraline 50 MG tablet Commonly known as: ZOLOFT Take 50 mg by mouth daily.   sildenafil 20 MG tablet Commonly known as:  REVATIO Take 20 mg by mouth daily as needed.       Allergies:  Allergies  Allergen Reactions  . Morphine And Related Itching    Family History: Family History  Problem Relation Age of Onset  . Stroke Mother   . Breast cancer Mother   . Bladder Cancer Neg Hx   . Kidney cancer Neg Hx   . Prostate cancer Neg Hx     Social History:  reports that he has been smoking cigarettes. He has a 50.00 pack-year smoking history. He has never used smokeless tobacco. He reports current alcohol use. He reports that he does not use drugs.   Physical Exam: BP (!) 145/90   Pulse 94   Constitutional:  Alert and oriented, No acute distress.  Standing, on oxygen HEENT: [place] AT, moist mucus membranes.  Trachea midline, no masses. Cardiovascular: No clubbing, cyanosis, or edema. Respiratory: Normal respiratory effort, no increased work of breathing. Skin: No rashes, bruises or suspicious lesions. Neurologic: Grossly intact, no focal deficits, moving all 4 extremities. Psychiatric: Normal mood and affect.  Assessment & Plan:    1. Prostate cancer ([place]) Low risk prostate cancer on active surveillance  Overall, his PSA is stabilized and in fact is trending back downwards which is reassuring  Given that he is a poor surgical candidate has multiple additional medical comorbidities and problems, will most strongly continue surveillance.  We will hold off on further imaging or repeat biopsies for the time being in light of the above.  He is agreeable to this plan.  Plan for PSA in 6 months with rectal exam  2. Benign prostatic hyperplasia with nocturia We discussed his nocturia may be physiologic related to lower extremity edema and fluid redistribution  Overall his symptoms are stable on finasteride only, we will continue this medication for the time being.  We elected not to make any adjustments to his medication today given his overall stability and focus on other medical issues.   Open 6 months  with IPSS/DRE/PSA/PVR  Hollice Espy, MD  [facility] [address] [place], [place] [address] [phone]

## 2020-12-28 ENCOUNTER — Ambulatory Visit: Payer: Medicare HMO | Admitting: Student in an Organized Health Care Education/Training Program

## 2020-12-31 ENCOUNTER — Other Ambulatory Visit: Payer: Self-pay

## 2020-12-31 ENCOUNTER — Encounter: Payer: Self-pay | Admitting: Student in an Organized Health Care Education/Training Program

## 2020-12-31 ENCOUNTER — Ambulatory Visit
Payer: Medicare HMO | Attending: Student in an Organized Health Care Education/Training Program | Admitting: Student in an Organized Health Care Education/Training Program

## 2020-12-31 VITALS — BP 145/87 | HR 94 | Temp 97.1°F | Resp 16 | Ht 75.0 in | Wt 240.0 lb

## 2020-12-31 DIAGNOSIS — M5416 Radiculopathy, lumbar region: Secondary | ICD-10-CM | POA: Insufficient documentation

## 2020-12-31 DIAGNOSIS — M5412 Radiculopathy, cervical region: Secondary | ICD-10-CM | POA: Diagnosis not present

## 2020-12-31 DIAGNOSIS — M47816 Spondylosis without myelopathy or radiculopathy, lumbar region: Secondary | ICD-10-CM | POA: Diagnosis not present

## 2020-12-31 DIAGNOSIS — G8929 Other chronic pain: Secondary | ICD-10-CM | POA: Diagnosis present

## 2020-12-31 DIAGNOSIS — M48062 Spinal stenosis, lumbar region with neurogenic claudication: Secondary | ICD-10-CM | POA: Insufficient documentation

## 2020-12-31 DIAGNOSIS — G894 Chronic pain syndrome: Secondary | ICD-10-CM | POA: Insufficient documentation

## 2020-12-31 DIAGNOSIS — F17200 Nicotine dependence, unspecified, uncomplicated: Secondary | ICD-10-CM | POA: Diagnosis not present

## 2020-12-31 DIAGNOSIS — M5136 Other intervertebral disc degeneration, lumbar region: Secondary | ICD-10-CM | POA: Diagnosis not present

## 2020-12-31 MED ORDER — TRAMADOL HCL 50 MG PO TABS: 50.0000 mg | ORAL_TABLET | Freq: Two times a day (BID) | ORAL | 0 refills | Status: AC | PRN

## 2020-12-31 NOTE — Patient Instructions (Signed)
1. Sign pain contract 2. Follow up Monday for RFA

## 2020-12-31 NOTE — Progress Notes (Signed)
PROVIDER NOTE: Information contained herein reflects review and annotations entered in association with encounter. Interpretation of such information and data should be left to medically-trained personnel. Information provided to patient can be located elsewhere in the medical record under "Patient Instructions". Document created using STT-dictation technology, any transcriptional errors that may result from process are unintentional.    Patient: Ernest [name]  Service Category: E/M  Provider: Gillis Santa, MD  DOB: October 10, 1956  DOS: 12/31/2020  Specialty: Interventional Pain Management  MRN: [id number]  Setting: Ambulatory outpatient  PCP: Tracie Harrier, MD  Type: Established Patient    Referring Provider: Tracie Harrier, MD  Location: Office  Delivery: Face-to-face     HPI  [name], a 65 y.o. year old male, is here today because of his Lumbar facet arthropathy [M47.816]. [name] primary complain today is Back Pain (Lower back) Last encounter: My last encounter with him was on 11/11/2020. Pertinent problems: [name] has Lumbar facet arthropathy; Spinal stenosis, lumbar region, with neurogenic claudication; Chronic radicular lumbar pain; Lumbar degenerative disc disease; and Chronic pain syndrome on their pertinent problem list. Pain Assessment: Severity of Chronic pain is reported as a 10-Worst pain ever/10. Location: Buttocks Right/pain radiate to right leg to knee. Onset: More than a month ago. Quality: Aching,Sharp,Shooting. Timing: Constant. Modifying factor(s): meds and heat. Vitals:  height is '6\' 3"'  (1.905 m) and weight is 240 lb (108.9 kg). His temporal temperature is 97.1 F (36.2 C) (abnormal). His blood pressure is 145/87 (abnormal) and his pulse is 94. His respiration is 16 and oxygen saturation is 100%.   Reason for encounter: medication management.    Ernest [name] presents today with increasing low back pain related to lumbar facet arthropathy, lumbar degenerative disc disease,  lumbar spinal stenosis.  He is scheduled for upcoming lumbar radiofrequency ablation starting with 1 side first this upcoming Monday.  He is having increased low back pain that is making it difficult for him to function.  He is on gabapentin 300 mg twice a day.  Higher doses resulted in sedation.  He is on Mobic 15 mg daily.  Also takes acetaminophen as needed.  We discussed the addition of tramadol 50 mg twice daily for severe, breakthrough pain.  Hopefully he will not need to be on this long-term after we have done his lumbar radiofrequency ablation.  He will sign a pain contract today.  UDS up-to-date and appropriate as below.  Pharmacotherapy Assessment   Analgesic: Tramadol 50 mg twice daily as needed, first prescription today  Monitoring: [place] PMP: PDMP reviewed during this encounter.       Pharmacotherapy: No side-effects or adverse reactions reported. Compliance: No problems identified. Effectiveness: Clinically acceptable.  No notes on file  UDS:  Summary  Date Value Ref Range Status  09/08/2020 Note  Final    Comment:    ==================================================================== Compliance Drug Analysis, Ur ==================================================================== Test                             Result       Flag       Units  Drug Present and Declared for Prescription Verification   Dextromethorphan               PRESENT      EXPECTED   Dextrorphan/Levorphanol        PRESENT      EXPECTED    Dextrorphan is an expected metabolite of dextromethorphan, an over-  the-counter or prescription cough suppressant. Dextrorphan cannot be    distinguished from the scheduled prescription medication levorphanol    by the method used for analysis.  Drug Present not Declared for Prescription Verification   Naproxen                       PRESENT      UNEXPECTED  Drug Absent but Declared for Prescription Verification   Hydrocodone                    Not Detected  UNEXPECTED ng/mg creat   Gabapentin                     Not Detected UNEXPECTED   Sertraline                     Not Detected UNEXPECTED   Acetaminophen                  Not Detected UNEXPECTED    Acetaminophen, as indicated in the declared medication list, is not    always detected even when used as directed.    Guaifenesin                    Not Detected UNEXPECTED ==================================================================== Test                      Result    Flag   Units      Ref Range   Creatinine              129              mg/dL      >=20 ==================================================================== Declared Medications:  The flagging and interpretation on this report are based on the  following declared medications.  Unexpected results may arise from  inaccuracies in the declared medications.   **Note: The testing scope of this panel includes these medications:   Dextromethorphan (Mucinex DM)  Gabapentin  Guaifenesin (Mucinex DM)  Hydrocodone  Sertraline (Zoloft)   **Note: The testing scope of this panel does not include small to  moderate amounts of these reported medications:   Acetaminophen   **Note: The testing scope of this panel does not include the  following reported medications:   Albuterol  Albuterol (Duoneb)  Azithromycin  Budesonide (Pulmicort)  Budesonide (Symbicort)  Finasteride (Proscar)  Fluticasone (Trelegy)  Formoterol (Symbicort)  Ipratropium (Duoneb)  Meloxicam  Metformin  Sildenafil  Umeclidinium (Trelegy)  Vilanterol (Trelegy) ==================================================================== For clinical consultation, please call [phone]. ====================================================================      ROS  Constitutional: Denies any fever or chills Gastrointestinal: No reported hemesis, hematochezia, vomiting, or acute GI distress Musculoskeletal: +lbp Neurological: No reported episodes of  acute onset apraxia, aphasia, dysarthria, agnosia, amnesia, paralysis, loss of coordination, or loss of consciousness  Medication Review  albuterol, budesonide, budesonide-formoterol, finasteride, gabapentin, ipratropium-albuterol, meloxicam, sertraline, sildenafil, and traMADol  History Review  Allergy: Ernest [name] is allergic to morphine and related. Drug: Ernest [name]  reports no history of drug use. Alcohol:  reports current alcohol use. Tobacco:  reports that he has been smoking cigarettes. He has a 50.00 pack-year smoking history. He has never used smokeless tobacco. Social: Ernest [name]  reports that he has been smoking cigarettes. He has a 50.00 pack-year smoking history. He has never used smokeless tobacco. He reports current alcohol use. He reports that he does not use drugs. Medical:  has a past medical history of Acute on chronic respiratory failure with hypoxia ([place]) (05/25/2017), Acute respiratory failure with hypoxia ([facility]) (05/25/2017), COPD (chronic obstructive pulmonary disease) ([place]), COPD with acute bronchitis ([place]) (05/25/2017), COPD with acute exacerbation ([place]) (05/25/2017), Diabetes mellitus without complication ([place]), Leukocytosis (05/25/2017), and Prostate cancer ([place]). Surgical: Ernest [name]  has a past surgical history that includes Testicle surgery. Family: family history includes Breast cancer in his mother; Stroke in his mother.  Laboratory Chemistry Profile   Renal Lab Results  Component Value Date   BUN 21 09/03/2020   CREATININE 0.94 09/03/2020   GFRAA >60 09/03/2020   GFRNONAA >60 09/03/2020     Hepatic Lab Results  Component Value Date   AST 19 09/02/2020   ALT 18 09/02/2020   ALBUMIN 3.8 09/02/2020   ALKPHOS 63 09/02/2020     Electrolytes Lab Results  Component Value Date   NA 138 09/03/2020   K 4.3 09/03/2020   CL 106 09/03/2020   CALCIUM 9.2 09/03/2020   MG 2.4 05/28/2013     Bone No results found for: VD25OH, VD125OH2TOT, PF7902IO9, BD5329JM4,  25OHVITD1, 25OHVITD2, 25OHVITD3, TESTOFREE, TESTOSTERONE   Inflammation (CRP: Acute Phase) (ESR: Chronic Phase) Lab Results  Component Value Date   LATICACIDVEN 3.1 ([place]) 07/30/2018       Note: Above Lab results reviewed.  Recent Imaging Review  CT CHEST LUNG CANCER SCREENING LOW DOSE WO CONTRAST CLINICAL DATA:  65 year old male with 50 pack-year history of smoking. Lung cancer screening.  EXAM: CT CHEST WITHOUT CONTRAST LOW-DOSE FOR LUNG CANCER SCREENING  TECHNIQUE: Multidetector CT imaging of the chest was performed following the standard protocol without IV contrast.  COMPARISON:  None.  FINDINGS: Cardiovascular: The heart size is normal. No substantial pericardial effusion. Coronary artery calcification is evident.  Mediastinum/Nodes: No mediastinal lymphadenopathy. No evidence for gross hilar lymphadenopathy although assessment is limited by the lack of intravenous contrast on today's study. The esophagus has normal imaging features. There is no axillary lymphadenopathy.  Lungs/Pleura: Centrilobular and paraseptal emphysema evident. 5 mm posterior right lower lobe pulmonary nodule evident. Platelike areas of atelectasis or scarring are noted in both lung bases progressive since CT angio chest 05/27/2017. No overtly suspicious nodule or mass. No pleural effusion.  Upper Abdomen: Calcified gallstones evident.  Musculoskeletal: No worrisome lytic or sclerotic osseous abnormality. Probable sebaceous cyst right paramidline back.  IMPRESSION: 1. Lung-RADS 2, benign appearance or behavior. Continue annual screening with low-dose chest CT without contrast in 12 months. 2. Cholelithiasis. 3. Emphysema (ICD10-J43.9).  Electronically Signed   By: Misty Stanley M.D.   On: 12/02/2020 08:14 Note: Reviewed        Physical Exam  General appearance: Well nourished, well developed, and well hydrated. In no apparent acute distress Mental status: Alert, oriented x 3 (person,  place, & time)       Respiratory: Oxygen-dependent COPD Eyes: PERLA Vitals: BP (!) 145/87 (BP Location: Left Arm, Patient Position: Sitting, Cuff Size: Large)   Pulse 94   Temp (!) 97.1 F (36.2 C) (Temporal)   Resp 16   Ht '6\' 3"'  (1.905 m)   Wt 240 lb (108.9 kg)   SpO2 100%   BMI 30.00 kg/m  BMI: Estimated body mass index is 30 kg/m as calculated from the following:   Height as of this encounter: '6\' 3"'  (1.905 m).   Weight as of this encounter: 240 lb (108.9 kg). Ideal: Ideal body weight: 84.5 kg (186 lb 4.6 oz) Adjusted ideal body weight: 94.2 kg (207  lb 12.4 oz)   Lumbar Exam  Skin & Axial Inspection:No masses, redness, or swelling Alignment:Symmetrical Functional MHW:KGSU restricted ROMaffecting both sides Stability:No instability detected Muscle Tone/Strength:Functionally intact. No obvious neuro-muscular anomalies detected. Sensory (Neurological):Musculoskeletal pain pattern Palpation:No palpable anomalies Provocative Tests: Hyperextension/rotation test:(+)bilaterally for facet joint pain. Lumbar quadrant test (Kemp's test):(+)bilaterally for facet joint pain. Lateral bending test:(+)due to pain. Patrick's Maneuver:(+)due to pain FABER* test:deferred today S-I anterior distraction/compression test:deferred today S-I lateral compression test:deferred today S-I Thigh-thrust test:deferred today S-I Gaenslen's test:deferred today *(Flexion, ABduction and External Rotation)  Gait & Posture Assessment  Ambulation:Patient came in today in a wheel chair Gait:Antalgic gait (limping) Posture:Difficulty standing up straight, due to pain  Lower Extremity Exam    Side:Right lower extremity  Side:Left lower extremity  Stability:No instability observed  Stability:No instability observed  Skin & Extremity Inspection:Skin color, temperature, and hair growth are  WNL. No peripheral edema or cyanosis. No masses, redness, swelling, asymmetry, or associated skin lesions. No contractures.  Skin & Extremity Inspection:Skin color, temperature, and hair growth are WNL. No peripheral edema or cyanosis. No masses, redness, swelling, asymmetry, or associated skin lesions. No contractures.  Functional PJS:RPRX restricted ROMfor hip and knee joints   Functional YVO:[id number] ROM   Muscle Tone/Strength:Functionally intact. No obvious neuro-muscular anomalies detected.  Muscle Tone/Strength:Functionally intact. No obvious neuro-muscular anomalies detected.  Sensory (Neurological):Neurogenic pain patternand musculoskeletal   Sensory (Neurological):Unimpaired  DTR: Patellar:deferred today Achilles:deferred today Plantar:deferred today  DTR: Patellar:deferred today Achilles:deferred today Plantar:deferred today  Palpation:No palpable anomalies  Palpation:No palpable anomalies      Assessment   Status Diagnosis  Having a Flare-up Having a Flare-up Persistent 1. Lumbar facet arthropathy   2. Lumbar spondylosis   3. Tobacco use disorder   4. Lumbar degenerative disc disease   5. Cervical radicular pain   6. Spinal stenosis, lumbar region, with neurogenic claudication   7. Chronic radicular lumbar pain   8. Chronic pain syndrome       Plan of Care  [name] has a current medication list which includes the following long-term medication(s): albuterol, budesonide-formoterol, gabapentin, and sertraline.  1.  Continue gabapentin and meloxicam as prescribed.  Continue acetaminophen as needed.  Sign pain contract and start tramadol 50 mg twice daily as below. 2.  Keep appointments for lumbar radiofrequency ablation procedure to help with low back pain related to lumbar facet arthropathy, lumbar degenerative disc disease. 3. Tobacco use: Patient has chronic pain. Studies show that people with  chronic pain tend to be heavier smokers and often smoke in response to pain. Chronic pain makes smoking cessation more difficult and increases the risk of relapse after quitting.  [place] and I discussed the importance of managing pain during the process of smoking cessation.  Counseled on the importance of smoking cessation and relapse prevention that includes management of of smoking in response to pain.  Pharmacotherapy (Medications Ordered): Meds ordered this encounter  Medications  . traMADol (ULTRAM) 50 MG tablet    Sig: Take 1 tablet (50 mg total) by mouth every 12 (twelve) hours as needed for severe pain. Month last 30 days.    Dispense:  60 tablet    Refill:  0    [place] STOP ACT - Not applicable. Fill one day early if pharmacy is closed on scheduled refill date.    Follow-up plan:   Return for Keep sch. appt.     Status post bilateral L3, L4, L5 facet medial branch nerve block on 09/14/2020, 80% pain relief for  5 days with improvement, 10/14/2020 60% pain relief for approximately 3 days.  Plan for lumbar RFA     Recent Visits Date Type Provider Dept  11/11/20 Office Visit Gillis Santa, MD [facility]  10/14/20 Procedure visit Gillis Santa, MD [facility]  10/06/20 Office Visit Gillis Santa, MD [facility]  Showing recent visits within past 90 days and meeting all other requirements Today's Visits Date Type Provider Dept  12/31/20 Office Visit Gillis Santa, MD [facility]  Showing today's visits and meeting all other requirements Future Appointments Date Type Provider Dept  01/04/21 Appointment Gillis Santa, MD [facility]  Showing future appointments within next 90 days and meeting all other requirements  I discussed the assessment and treatment plan with the patient. The patient was provided an opportunity to ask questions and all were answered. The patient agreed with the plan and demonstrated an understanding of the  instructions.  Patient advised to call back or seek an in-person evaluation if the symptoms or condition worsens.  Duration of encounter: 46mnutes.  Note by: BGillis Santa MD Date: 12/31/2020; Time: 10:06 AM

## 2021-01-04 ENCOUNTER — Ambulatory Visit
Admission: RE | Admit: 2021-01-04 | Discharge: 2021-01-04 | Disposition: A | Discharge status: Home / Self Care | Payer: Medicare HMO | Source: Ambulatory Visit | Attending: Student in an Organized Health Care Education/Training Program | Admitting: Student in an Organized Health Care Education/Training Program

## 2021-01-04 ENCOUNTER — Encounter: Payer: Self-pay | Admitting: Student in an Organized Health Care Education/Training Program

## 2021-01-04 ENCOUNTER — Other Ambulatory Visit: Payer: Self-pay

## 2021-01-04 ENCOUNTER — Ambulatory Visit (HOSPITAL_BASED_OUTPATIENT_CLINIC_OR_DEPARTMENT_OTHER): Payer: Medicare HMO | Admitting: Student in an Organized Health Care Education/Training Program

## 2021-01-04 DIAGNOSIS — G894 Chronic pain syndrome: Secondary | ICD-10-CM | POA: Diagnosis not present

## 2021-01-04 DIAGNOSIS — M47816 Spondylosis without myelopathy or radiculopathy, lumbar region: Secondary | ICD-10-CM | POA: Diagnosis not present

## 2021-01-04 MED ORDER — ROPIVACAINE HCL 2 MG/ML IJ SOLN
9.0000 mL | Freq: Once | INTRAMUSCULAR | Status: AC
  Administered 2021-01-04: 10 mL via PERINEURAL
  Filled 2021-01-04: qty 10

## 2021-01-04 MED ORDER — MIDAZOLAM HCL 5 MG/5ML IJ SOLN
1.0000 mg | INTRAMUSCULAR | Status: DC | PRN
  Administered 2021-01-04: 1 mg via INTRAVENOUS
  Filled 2021-01-04: qty 5

## 2021-01-04 MED ORDER — IOHEXOL 180 MG/ML  SOLN
10.0000 mL | Freq: Once | INTRAMUSCULAR | Status: DC
  Filled 2021-01-04: qty 20

## 2021-01-04 MED ORDER — DEXAMETHASONE SODIUM PHOSPHATE 10 MG/ML IJ SOLN
10.0000 mg | Freq: Once | INTRAMUSCULAR | Status: AC
  Administered 2021-01-04: 10 mg
  Filled 2021-01-04: qty 1

## 2021-01-04 MED ORDER — LIDOCAINE HCL 2 % IJ SOLN
20.0000 mL | Freq: Once | INTRAMUSCULAR | Status: AC
  Administered 2021-01-04: 100 mg
  Filled 2021-01-04: qty 20

## 2021-01-04 NOTE — Progress Notes (Signed)
Safety precautions to be maintained throughout the outpatient stay will include: orient to surroundings, keep bed in low position, maintain call bell within reach at all times, provide assistance with transfer out of bed and ambulation.  

## 2021-01-04 NOTE — Progress Notes (Signed)
PROVIDER NOTE: Information contained herein reflects review and annotations entered in association with encounter. Interpretation of such information and data should be left to medically-trained personnel. Information provided to patient can be located elsewhere in the medical record under "Patient Instructions". Document created using STT-dictation technology, any transcriptional errors that may result from process are unintentional.    Patient: Ernest [name]  Service Category: Procedure  Provider: Gillis Santa, MD  DOB: 1956/03/06  DOS: 01/04/2021  Location: [facility]  MRN: [id number]  Setting: Ambulatory - outpatient  Referring Provider: Gillis Santa, MD  Type: Established Patient  Specialty: Interventional Pain Management  PCP: Tracie Harrier, MD   Primary Reason for Visit: Interventional Pain Management Treatment. CC: Back Pain (low) and Knee Pain (right)  Procedure:          Anesthesia, Analgesia, Anxiolysis:  Type: Thermal Lumbar Facet, Medial Branch Radiofrequency Ablation/Neurotomy  #1  Primary Purpose: Therapeutic Region: Posterolateral Lumbosacral Spine Level: L3, L4, L5, Medial Branch Level(s). These levels will denervate the L3-4, L4-5  lumbar facet joints. Laterality: Right  Type: Moderate (Conscious) Sedation combined with Local Anesthesia Indication(s): Analgesia and Anxiety Route: Intravenous (IV) IV Access: Secured Sedation: Meaningful verbal contact was maintained at all times during the procedure  Local Anesthetic: Lidocaine 1-2%  Position: Prone   Indications: 1. Lumbar facet arthropathy   2. Lumbar spondylosis   3. Chronic pain syndrome    [name] has been dealing with the above chronic pain for longer than three months and has either failed to respond, was unable to tolerate, or simply did not get enough benefit from other more conservative therapies including, but not limited to: 1. Over-the-counter medications 2. Anti-inflammatory  medications 3. Muscle relaxants 4. Membrane stabilizers 5. Opioids 6. Physical therapy and/or chiropractic manipulation 7. Modalities (Heat, ice, etc.) 8. Invasive techniques such as nerve blocks. Mr. Pulse has attained more than 50% relief of the pain from a series of diagnostic injections conducted in separate occasions.  Pain Score: Pre-procedure: 10-Worst pain ever/10 Post-procedure: 6 /10  Pre-op H&P Assessment:  [name] is a 65 y.o. (year old), male patient, seen today for interventional treatment. He  has a past surgical history that includes Testicle surgery. [name] has a current medication list which includes the following prescription(s): albuterol, budesonide, budesonide-formoterol, finasteride, gabapentin, ipratropium-albuterol, meloxicam, sertraline, sildenafil, and tramadol, and the following Facility-Administered Medications: iohexol and midazolam. His primarily concern today is the Back Pain (low) and Knee Pain (right)  Initial Vital Signs:  Pulse/HCG Rate: 87ECG Heart Rate: 78 Temp: (!) 96.5 F (35.8 C) Resp: (!) 24 BP: (!) 126/92 SpO2: 100 %  BMI: Estimated body mass index is 30 kg/m as calculated from the following:   Height as of this encounter: 6\' 3"  (1.905 m).   Weight as of this encounter: 240 lb (108.9 kg).  Risk Assessment: Allergies: Reviewed. He is allergic to morphine and related.  Allergy Precautions: None required Coagulopathies: Reviewed. None identified.  Blood-thinner therapy: None at this time Active Infection(s): Reviewed. None identified. [name] is afebrile  Site Confirmation: [name] was asked to confirm the procedure and laterality before marking the site Procedure checklist: Completed Consent: Before the procedure and under the influence of no sedative(s), amnesic(s), or anxiolytics, the patient was informed of the treatment options, risks and possible complications. To fulfill our ethical and legal obligations, as  recommended by the American Medical Association's Code of Ethics, I have informed the patient of my clinical impression; the nature and purpose of  the treatment or procedure; the risks, benefits, and possible complications of the intervention; the alternatives, including doing nothing; the risk(s) and benefit(s) of the alternative treatment(s) or procedure(s); and the risk(s) and benefit(s) of doing nothing. The patient was provided information about the general risks and possible complications associated with the procedure. These may include, but are not limited to: failure to achieve desired goals, infection, bleeding, organ or nerve damage, allergic reactions, paralysis, and death. In addition, the patient was informed of those risks and complications associated to Spine-related procedures, such as failure to decrease pain; infection (i.e.: Meningitis, epidural or intraspinal abscess); bleeding (i.e.: epidural hematoma, subarachnoid hemorrhage, or any other type of intraspinal or peri-dural bleeding); organ or nerve damage (i.e.: Any type of peripheral nerve, nerve root, or spinal cord injury) with subsequent damage to sensory, motor, and/or autonomic systems, resulting in permanent pain, numbness, and/or weakness of one or several areas of the body; allergic reactions; (i.e.: anaphylactic reaction); and/or death. Furthermore, the patient was informed of those risks and complications associated with the medications. These include, but are not limited to: allergic reactions (i.e.: anaphylactic or anaphylactoid reaction(s)); adrenal axis suppression; blood sugar elevation that in diabetics may result in ketoacidosis or comma; water retention that in patients with history of congestive heart failure may result in shortness of breath, pulmonary edema, and decompensation with resultant heart failure; weight gain; swelling or edema; medication-induced neural toxicity; particulate matter embolism and blood vessel  occlusion with resultant organ, and/or nervous system infarction; and/or aseptic necrosis of one or more joints. Finally, the patient was informed that Medicine is not an exact science; therefore, there is also the possibility of unforeseen or unpredictable risks and/or possible complications that may result in a catastrophic outcome. The patient indicated having understood very clearly. We have given the patient no guarantees and we have made no promises. Enough time was given to the patient to ask questions, all of which were answered to the patient's satisfaction. Mr. Sanderfer has indicated that he wanted to continue with the procedure. Attestation: I, the ordering provider, attest that I have discussed with the patient the benefits, risks, side-effects, alternatives, likelihood of achieving goals, and potential problems during recovery for the procedure that I have provided informed consent. Date  Time: 01/04/2021  9:46 AM  Pre-Procedure Preparation:  Monitoring: As per clinic protocol. Respiration, ETCO2, SpO2, BP, heart rate and rhythm monitor placed and checked for adequate function Safety Precautions: Patient was assessed for positional comfort and pressure points before starting the procedure. Time-out: I initiated and conducted the "Time-out" before starting the procedure, as per protocol. The patient was asked to participate by confirming the accuracy of the "Time Out" information. Verification of the correct person, site, and procedure were performed and confirmed by me, the nursing staff, and the patient. "Time-out" conducted as per Joint Commission's Universal Protocol (UP.01.01.01). Time: 1042  Description of Procedure:          Laterality: Right Levels: L3, L4, L5, Medial Branch Level(s), at the L3-4, L4-5, lumbar facet joints. Area Prepped: Lumbosacral DuraPrep (Iodine Povacrylex [0.7% available iodine] and Isopropyl Alcohol, 74% w/w) Safety Precautions: Aspiration looking for blood  return was conducted prior to all injections. At no point did we inject any substances, as a needle was being advanced. Before injecting, the patient was told to immediately notify me if he was experiencing any new onset of "ringing in the ears, or metallic taste in the mouth". No attempts were made at seeking any paresthesias. Safe injection  practices and needle disposal techniques used. Medications properly checked for expiration dates. SDV (single dose vial) medications used. After the completion of the procedure, all disposable equipment used was discarded in the proper designated medical waste containers. Local Anesthesia: Protocol guidelines were followed. The patient was positioned over the fluoroscopy table. The area was prepped in the usual manner. The time-out was completed. The target area was identified using fluoroscopy. A 12-in long, straight, sterile hemostat was used with fluoroscopic guidance to locate the targets for each level blocked. Once located, the skin was marked with an approved surgical skin marker. Once all sites were marked, the skin (epidermis, dermis, and hypodermis), as well as deeper tissues (fat, connective tissue and muscle) were infiltrated with a small amount of a short-acting local anesthetic, loaded on a 10cc syringe with a 25G, 1.5-in  Needle. An appropriate amount of time was allowed for local anesthetics to take effect before proceeding to the next step. Local Anesthetic: Lidocaine 2.0% The unused portion of the local anesthetic was discarded in the proper designated containers. Technical explanation of process:  Radiofrequency Ablation (RFA)  L3 Medial Branch Nerve RFA: The target area for the L3 medial branch is at the junction of the postero-lateral aspect of the superior articular process and the superior, posterior, and medial edge of the transverse process of L4. Under fluoroscopic guidance, a Radiofrequency needle was inserted until contact was made with os over  the superior postero-lateral aspect of the pedicular shadow (target area). Sensory and motor testing was conducted to properly adjust the position of the needle. Once satisfactory placement of the needle was achieved, the numbing solution was slowly injected after negative aspiration for blood. 2.0 mL of the nerve block solution was injected without difficulty or complication. After waiting for at least 3 minutes, the ablation was performed. Once completed, the needle was removed intact. L4 Medial Branch Nerve RFA: The target area for the L4 medial branch is at the junction of the postero-lateral aspect of the superior articular process and the superior, posterior, and medial edge of the transverse process of L5. Under fluoroscopic guidance, a Radiofrequency needle was inserted until contact was made with os over the superior postero-lateral aspect of the pedicular shadow (target area). Sensory and motor testing was conducted to properly adjust the position of the needle. Once satisfactory placement of the needle was achieved, the numbing solution was slowly injected after negative aspiration for blood. 2.0 mL of the nerve block solution was injected without difficulty or complication. After waiting for at least 3 minutes, the ablation was performed. Once completed, the needle was removed intact. L5 Medial Branch Nerve RFA: The target area for the L5 medial branch is at the junction of the postero-lateral aspect of the superior articular process of S1 and the superior, posterior, and medial edge of the sacral ala. Under fluoroscopic guidance, a Radiofrequency needle was inserted until contact was made with os over the superior postero-lateral aspect of the pedicular shadow (target area). Sensory and motor testing was conducted to properly adjust the position of the needle. Once satisfactory placement of the needle was achieved, the numbing solution was slowly injected after negative aspiration for blood. 2.0 mL of  the nerve block solution was injected without difficulty or complication. After waiting for at least 3 minutes, the ablation was performed. Once completed, the needle was removed intact.  Radiofrequency lesioning (ablation):  Radiofrequency Generator: NeuroTherm [id number] Sensory Stimulation Parameters: 50 Hz was used to locate & identify the nerve, making sure  that the needle was positioned such that there was no sensory stimulation below 0.3 V or above 0.7 V. Motor Stimulation Parameters: 2 Hz was used to evaluate the motor component. Care was taken not to lesion any nerves that demonstrated motor stimulation of the lower extremities at an output of less than 2.5 times that of the sensory threshold, or a maximum of 2.0 V. Lesioning Technique Parameters: Standard Radiofrequency settings. (Not bipolar or pulsed.) Temperature Settings: 80 degrees C Lesioning time: 60 seconds Intra-operative Compliance: Compliant Materials & Medications: Needle(s) (Electrode/Cannula) Type: Teflon-coated, curved tip, Radiofrequency needle(s) Gauge: 22G Length: 10cm Numbing solution: 8 cc solution made of 7 cc of 0.2% ropivacaine, 1 cc of Decadron 10 mg/cc.  2 cc injected at each level above on the right after sensorimotor testing, prior to lesioning.  The unused portion of the solution was discarded in the proper designated containers.  Once the entire procedure was completed, the treated area was cleaned, making sure to leave some of the prepping solution back to take advantage of its long term bactericidal properties.  Illustration of the posterior view of the lumbar spine and the posterior neural structures. Laminae of L2 through S1 are labeled. DPRL5, dorsal primary ramus of L5; DPRS1, dorsal primary ramus of S1; DPR3, dorsal primary ramus of L3; FJ, facet (zygapophyseal) joint L3-L4; I, inferior articular process of L4; LB1, lateral branch of dorsal primary ramus of L1; IAB, inferior articular branches from L3  medial branch (supplies L4-L5 facet joint); IBP, intermediate branch plexus; MB3, medial branch of dorsal primary ramus of L3; NR3, third lumbar nerve root; S, superior articular process of L5; SAB, superior articular branches from L4 (supplies L4-5 facet joint also); TP3, transverse process of L3.  Vitals:   01/04/21 1053 01/04/21 1103 01/04/21 1113 01/04/21 1123  BP: (!) 154/134 (!) 136/95 131/87 (!) 141/90  Pulse:  84 77 81  Resp: 18 16 16 16   Temp:    97.8 F (36.6 C)  TempSrc:    Temporal  SpO2: 100% 100% 100% 100%  Weight:      Height:       Start Time: 1043 hrs. End Time: 1054 hrs.  Imaging Guidance (Spinal):          Type of Imaging Technique: Fluoroscopy Guidance (Spinal) Indication(s): Assistance in needle guidance and placement for procedures requiring needle placement in or near specific anatomical locations not easily accessible without such assistance. Exposure Time: Please see nurses notes. Contrast: None used. Fluoroscopic Guidance: I was personally present during the use of fluoroscopy. "Tunnel Vision Technique" used to obtain the best possible view of the target area. Parallax error corrected before commencing the procedure. "Direction-depth-direction" technique used to introduce the needle under continuous pulsed fluoroscopy. Once target was reached, antero-posterior, oblique, and lateral fluoroscopic projection used confirm needle placement in all planes. Images permanently stored in EMR. Interpretation: No contrast injected. I personally interpreted the imaging intraoperatively. Adequate needle placement confirmed in multiple planes. Permanent images saved into the patient's record.    Post-operative Assessment:  Post-procedure Vital Signs:  Pulse/HCG Rate: 8182 Temp: 97.8 F (36.6 C) Resp: 16 BP: (!) 141/90 SpO2: 100 %  EBL: None  Complications: No immediate post-treatment complications observed by team, or reported by patient.  Note: The patient tolerated  the entire procedure well. A repeat set of vitals were taken after the procedure and the patient was kept under observation following institutional policy, for this type of procedure. Post-procedural neurological assessment was performed, showing return to baseline,  prior to discharge. The patient was provided with post-procedure discharge instructions, including a section on how to identify potential problems. Should any problems arise concerning this procedure, the patient was given instructions to immediately contact us, at any time, without hesitation. In any case, we plan to contact the patient by telephone for a follow-up status report regarding this interventional procedure.  Comments:  No additional relevant information.  5 out of 5 strength bilateral lower extremity: Plantar flexion, dorsiflexion, knee flexion, knee extension.  Plan of Care  Orders:  Orders Placed This Encounter  Procedures  . [facility] C-ARM 1-60 MIN NO REPORT    Intraoperative interpretation by procedural physician at [facility].    Standing Status:   Standing    Number of Occurrences:   1    Order Specific Question:   Reason for exam:    Answer:   Assistance in needle guidance and placement for procedures requiring needle placement in or near specific anatomical locations not easily accessible without such assistance.   Medications ordered for procedure: Meds ordered this encounter  Medications  . iohexol (OMNIPAQUE) 180 MG/ML injection 10 mL    Must be Myelogram-compatible. If not available, you may substitute with a water-soluble, non-ionic, hypoallergenic, myelogram-compatible radiological contrast medium.  Marland Kitchen lidocaine (XYLOCAINE) 2 % (with pres) injection 400 mg  . midazolam (VERSED) 5 MG/5ML injection 1-2 mg    Make sure Flumazenil is available in the pyxis when using this medication. If oversedation occurs, administer 0.2 mg IV over 15 sec. If after 45 sec no response, administer 0.2 mg  again over 1 min; may repeat at 1 min intervals; not to exceed 4 doses (1 mg)  . ropivacaine (PF) 2 mg/mL (0.2%) (NAROPIN) injection 9 mL  . dexamethasone (DECADRON) injection 10 mg   Medications administered: We administered lidocaine, midazolam, ropivacaine (PF) 2 mg/mL (0.2%), and dexamethasone.  See the medical record for exact dosing, route, and time of administration.  Follow-up plan:   Return in about 2 weeks (around 01/18/2021) for LEFT L3,4,5 RFA with Versed .      Status post bilateral L3, L4, L5 facet medial branch nerve block on 09/14/2020, 80% pain relief for 5 days with improvement, 10/14/2020 60% pain relief for approximately 3 days.  Right L3, L4, L5 RFA 01/04/2021     Recent Visits Date Type Provider Dept  12/31/20 Office Visit Gillis Santa, MD [facility]  11/11/20 Office Visit Gillis Santa, MD [facility]  10/14/20 Procedure visit Gillis Santa, MD [facility]  10/06/20 Office Visit Gillis Santa, MD [facility]  Showing recent visits within past 90 days and meeting all other requirements Today's Visits Date Type Provider Dept  01/04/21 Procedure visit Gillis Santa, MD [facility]  Showing today's visits and meeting all other requirements Future Appointments Date Type Provider Dept  01/18/21 Appointment Gillis Santa, MD [facility]  Showing future appointments within next 90 days and meeting all other requirements  Disposition: Discharge home  Discharge (Date  Time): 01/04/2021; 1126 hrs.   Primary Care Physician: Tracie Harrier, MD Location: [facility] Note by: Gillis Santa, MD Date: 01/04/2021; Time: 11:51 AM  Disclaimer:  Medicine is not an exact science. The only guarantee in medicine is that nothing is guaranteed. It is important to note that the decision to proceed with this intervention was based on the information collected from the patient. The Data and  conclusions were drawn from the patient's questionnaire,  the interview, and the physical examination. Because the information was provided in large part by the patient, it cannot be guaranteed that it has not been purposely or unconsciously manipulated. Every effort has been made to obtain as much relevant data as possible for this evaluation. It is important to note that the conclusions that lead to this procedure are derived in large part from the available data. Always take into account that the treatment will also be dependent on availability of resources and existing treatment guidelines, considered by other Pain Management Practitioners as being common knowledge and practice, at the time of the intervention. For Medico-Legal purposes, it is also important to point out that variation in procedural techniques and pharmacological choices are the acceptable norm. The indications, contraindications, technique, and results of the above procedure should only be interpreted and judged by a Board-Certified Interventional Pain Specialist with extensive familiarity and expertise in the same exact procedure and technique.

## 2021-01-04 NOTE — Patient Instructions (Signed)
____________________________________________________________________________________________  Post-Procedure Discharge Instructions  Instructions:  Apply ice:   Purpose: This will minimize any swelling and discomfort after procedure.   When: Day of procedure, as soon as you get home.  How: Fill a plastic sandwich bag with crushed ice. Cover it with a small towel and apply to injection site.  How long: (15 min on, 15 min off) Apply for 15 minutes then remove x 15 minutes.  Repeat sequence on day of procedure, until you go to bed.  Apply heat:   Purpose: To treat any soreness and discomfort from the procedure.  When: Starting the next day after the procedure.  How: Apply heat to procedure site starting the day following the procedure.  How long: May continue to repeat daily, until discomfort goes away.  Food intake: Start with clear liquids (like water) and advance to regular food, as tolerated.   Physical activities: Keep activities to a minimum for the first 8 hours after the procedure. After that, then as tolerated.  Driving: If you have received any sedation, be responsible and do not drive. You are not allowed to drive for 24 hours after having sedation.  Blood thinner: (Applies only to those taking blood thinners) You may restart your blood thinner 6 hours after your procedure.  Insulin: (Applies only to Diabetic patients taking insulin) As soon as you can eat, you may resume your normal dosing schedule.  Infection prevention: Keep procedure site clean and dry. Shower daily and clean area with soap and water.  Post-procedure Pain Diary: Extremely important that this be done correctly and accurately. Recorded information will be used to determine the next step in treatment. For the purpose of accuracy, follow these rules:  Evaluate only the area treated. Do not report or include pain from an untreated area. For the purpose of this evaluation, ignore all other areas of pain,  except for the treated area.  After your procedure, avoid taking a long nap and attempting to complete the pain diary after you wake up. Instead, set your alarm clock to go off every hour, on the hour, for the initial 8 hours after the procedure. Document the duration of the numbing medicine, and the relief you are getting from it.  Do not go to sleep and attempt to complete it later. It will not be accurate. If you received sedation, it is likely that you were given a medication that may cause amnesia. Because of this, completing the diary at a later time may cause the information to be inaccurate. This information is needed to plan your care.  Follow-up appointment: Keep your post-procedure follow-up evaluation appointment after the procedure (usually 2 weeks for most procedures, 6 weeks for radiofrequencies). DO NOT FORGET to bring you pain diary with you.   Expect: (What should I expect to see with my procedure?)  From numbing medicine (AKA: Local Anesthetics): Numbness or decrease in pain. You may also experience some weakness, which if present, could last for the duration of the local anesthetic.  Onset: Full effect within 15 minutes of injected.  Duration: It will depend on the type of local anesthetic used. On the average, 1 to 8 hours.   From steroids (Applies only if steroids were used): Decrease in swelling or inflammation. Once inflammation is improved, relief of the pain will follow.  Onset of benefits: Depends on the amount of swelling present. The more swelling, the longer it will take for the benefits to be seen. In some cases, up to 10 days.    Duration: Steroids will stay in the system x 2 weeks. Duration of benefits will depend on multiple posibilities including persistent irritating factors.  Side-effects: If present, they may typically last 2 weeks (the duration of the steroids).  Frequent: Cramps (if they occur, drink Gatorade and take over-the-counter Magnesium 450-500 mg  once to twice a day); water retention with temporary weight gain; increases in blood sugar; decreased immune system response; increased appetite.  Occasional: Facial flushing (red, warm cheeks); mood swings; menstrual changes.  Uncommon: Long-term decrease or suppression of natural hormones; bone thinning. (These are more common with higher doses or more frequent use. This is why we prefer that our patients avoid having any injection therapies in other practices.)   Very Rare: Severe mood changes; psychosis; aseptic necrosis.  From procedure: Some discomfort is to be expected once the numbing medicine wears off. This should be minimal if ice and heat are applied as instructed.  Call if: (When should I call?)  You experience numbness and weakness that gets worse with time, as opposed to wearing off.  New onset bowel or bladder incontinence. (Applies only to procedures done in the spine)  Emergency Numbers:  Durning business hours (Monday - Thursday, 8:00 AM - 4:00 PM) (Friday, 9:00 AM - 12:00 Noon): [phone]  After hours: [phone]  NOTE: If you are having a problem and are unable connect with, or to talk to a provider, then go to your nearest urgent care or emergency department. If the problem is serious and urgent, please call 911. ____________________________________________________________________________________________   Radiofrequency Lesioning Radiofrequency lesioning is a procedure that is performed to relieve pain. The procedure is often used for back, neck, or arm pain. Radiofrequency lesioning involves the use of a machine that creates radio waves to make heat. During the procedure, the heat is applied to the nerve that carries the pain signal. The heat damages the nerve and interferes with the pain signal. Pain relief usually starts about 2 weeks after the procedure and lasts for 6 months to 1 year. You will be awake during the procedure. You will need to be able to  talk with the health care provider during the procedure. Tell a health care provider about:  Any allergies you have.  All medicines you are taking, including vitamins, herbs, eye drops, creams, and over-the-counter medicines.  Any problems you or family members have had with anesthetic medicines.  Any blood disorders you have.  Any surgeries you have had.  Any medical conditions you have or have had.  Whether you are pregnant or may be pregnant. What are the risks? Generally, this is a safe procedure. However, problems may occur, including:  Pain or soreness at the injection site.  Allergic reaction to medicines given during the procedure.  Bleeding.  Infection at the injection site.  Damage to nerves or blood vessels. What happens before the procedure? Staying hydrated Follow instructions from your health care provider about hydration, which may include:  Up to 2 hours before the procedure - you may continue to drink clear liquids, such as water, clear fruit juice, black coffee, and plain tea. Eating and drinking Follow instructions from your health care provider about eating and drinking, which may include:  8 hours before the procedure - stop eating heavy meals or foods, such as meat, fried foods, or fatty foods.  6 hours before the procedure - stop eating light meals or foods, such as toast or cereal.  6 hours before the procedure - stop drinking  milk or drinks that contain milk.  2 hours before the procedure - stop drinking clear liquids. Medicines Ask your health care provider about:  Changing or stopping your regular medicines. This is especially important if you are taking diabetes medicines or blood thinners.  Taking medicines such as aspirin and ibuprofen. These medicines can thin your blood. Do not take these medicines unless your health care provider tells you to take them.  Taking over-the-counter medicines, vitamins, herbs, and supplements. General  instructions  Plan to have someone take you home from the hospital or clinic.  If you will be going home right after the procedure, plan to have someone with you for 24 hours.  Ask your health care provider what steps will be taken to help prevent infection. These may include: ? Removing hair at the procedure site. ? Washing skin with a germ-killing soap. ? Taking antibiotic medicine. What happens during the procedure?  An IV will be inserted into one of your veins.  You will be given one or more of the following: ? A medicine to help you relax (sedative). ? A medicine to numb the area (local anesthetic).  Your health care provider will insert a radiofrequency needle into the area to be treated. This is done with the help of a type of X-ray (fluoroscopy).  A wire that carries the radio waves (electrode) will be put through the radiofrequency needle.  An electrical pulse will be sent through the electrode to verify the correct nerve that is causing your pain. You will feel a tingling sensation, and you may have muscle twitching.  The tissue around the needle tip will be heated by an electric current that comes from the radiofrequency machine. This will numb the nerves.  The needle will be removed.  A bandage (dressing) will be put on the insertion area. The procedure may vary among health care providers and hospitals.   What happens after the procedure?  Your blood pressure, heart rate, breathing rate, and blood oxygen level will be monitored until you leave the hospital or clinic.  Return to your normal activities as told by your health care provider. Ask your health care provider what activities are safe for you.  Do not drive for 24 hours if you were given a sedative during your procedure. Summary  Radiofrequency lesioning is a procedure that is performed to relieve pain. The procedure is often used for back, neck, or arm pain.  Radiofrequency lesioning involves the use of  a machine that creates radio waves to make heat.  Plan to have someone take you home from the hospital or clinic. Do not drive for 24 hours if you were given a sedative during your procedure.  Return to your normal activities as told by your health care provider. Ask your health care provider what activities are safe for you. This information is not intended to replace advice given to you by your health care provider. Make sure you discuss any questions you have with your health care provider. Document Revised: 08/16/2018 Document Reviewed: 08/16/2018 [employer] Patient Education  [place].

## 2021-01-05 ENCOUNTER — Telehealth: Payer: Self-pay | Admitting: *Deleted

## 2021-01-05 NOTE — Telephone Encounter (Signed)
Attempted to call for post procedure follow-up. Message left. 

## 2021-01-18 ENCOUNTER — Ambulatory Visit
Admission: RE | Admit: 2021-01-18 | Discharge: 2021-01-18 | Disposition: A | Discharge status: Home / Self Care | Payer: Medicare HMO | Source: Ambulatory Visit | Attending: Student in an Organized Health Care Education/Training Program | Admitting: Student in an Organized Health Care Education/Training Program

## 2021-01-18 ENCOUNTER — Other Ambulatory Visit: Payer: Self-pay

## 2021-01-18 ENCOUNTER — Ambulatory Visit (HOSPITAL_BASED_OUTPATIENT_CLINIC_OR_DEPARTMENT_OTHER): Payer: Medicare HMO | Admitting: Student in an Organized Health Care Education/Training Program

## 2021-01-18 ENCOUNTER — Encounter: Payer: Self-pay | Admitting: Student in an Organized Health Care Education/Training Program

## 2021-01-18 VITALS — BP 125/86 | HR 83 | Temp 98.0°F | Resp 16 | Ht 75.0 in | Wt 240.0 lb

## 2021-01-18 DIAGNOSIS — M47816 Spondylosis without myelopathy or radiculopathy, lumbar region: Secondary | ICD-10-CM | POA: Insufficient documentation

## 2021-01-18 DIAGNOSIS — G894 Chronic pain syndrome: Secondary | ICD-10-CM

## 2021-01-18 MED ORDER — LIDOCAINE HCL (PF) 2 % IJ SOLN
INTRAMUSCULAR | Status: AC
  Filled 2021-01-18: qty 5

## 2021-01-18 MED ORDER — LIDOCAINE HCL 2 % IJ SOLN
20.0000 mL | Freq: Once | INTRAMUSCULAR | Status: AC
  Administered 2021-01-18: 200 mg
  Filled 2021-01-18: qty 20

## 2021-01-18 MED ORDER — ROPIVACAINE HCL 2 MG/ML IJ SOLN
INTRAMUSCULAR | Status: AC
  Filled 2021-01-18: qty 10

## 2021-01-18 MED ORDER — DEXAMETHASONE SODIUM PHOSPHATE 10 MG/ML IJ SOLN
INTRAMUSCULAR | Status: AC
  Filled 2021-01-18: qty 1

## 2021-01-18 MED ORDER — ROPIVACAINE HCL 2 MG/ML IJ SOLN
9.0000 mL | Freq: Once | INTRAMUSCULAR | Status: AC
  Administered 2021-01-18: 9 mL via PERINEURAL

## 2021-01-18 MED ORDER — MIDAZOLAM HCL 5 MG/5ML IJ SOLN
1.0000 mg | INTRAMUSCULAR | Status: DC | PRN
  Administered 2021-01-18: 1 mg via INTRAVENOUS

## 2021-01-18 MED ORDER — MIDAZOLAM HCL 5 MG/5ML IJ SOLN
INTRAMUSCULAR | Status: AC
  Filled 2021-01-18: qty 5

## 2021-01-18 MED ORDER — DEXAMETHASONE SODIUM PHOSPHATE 10 MG/ML IJ SOLN
10.0000 mg | Freq: Once | INTRAMUSCULAR | Status: AC
  Administered 2021-01-18: 10 mg

## 2021-01-18 NOTE — Progress Notes (Signed)
PROVIDER NOTE: Information contained herein reflects review and annotations entered in association with encounter. Interpretation of such information and data should be left to medically-trained personnel. Information provided to patient can be located elsewhere in the medical record under "Patient Instructions". Document created using STT-dictation technology, any transcriptional errors that may result from process are unintentional.    Patient: Ernest [name]  Service Category: Procedure  Provider: Gillis Santa, MD  DOB: 1956/10/21  DOS: 01/18/2021  Location: [facility]  MRN: [id number]  Setting: Ambulatory - outpatient  Referring Provider: Tracie Harrier, MD  Type: Established Patient  Specialty: Interventional Pain Management  PCP: Tracie Harrier, MD   Primary Reason for Visit: Interventional Pain Management Treatment. CC: Back Pain (lower)  Procedure:          Anesthesia, Analgesia, Anxiolysis:  Type: Thermal Lumbar Facet, Medial Branch Radiofrequency Ablation/Neurotomy  #1  Primary Purpose: Therapeutic Region: Posterolateral Lumbosacral Spine Level: L3, L4, L5, Medial Branch Level(s). These levels will denervate the L3-4, L4-5  lumbar facet joints. Laterality: Left  Type: Moderate (Conscious) Sedation combined with Local Anesthesia Indication(s): Analgesia and Anxiety Route: Intravenous (IV) IV Access: Secured Sedation: Meaningful verbal contact was maintained at all times during the procedure  Local Anesthetic: Lidocaine 1-2%  Position: Prone   Indications: 1. Lumbar facet arthropathy   2. Lumbar spondylosis   3. Chronic pain syndrome    Ernest [name] has been dealing with the above chronic pain for longer than three months and has either failed to respond, was unable to tolerate, or simply did not get enough benefit from other more conservative therapies including, but not limited to: 1. Over-the-counter medications 2. Anti-inflammatory medications 3. Muscle  relaxants 4. Membrane stabilizers 5. Opioids 6. Physical therapy and/or chiropractic manipulation 7. Modalities (Heat, ice, etc.) 8. Invasive techniques such as nerve blocks. Ernest [name] has attained more than 50% relief of the pain from a series of diagnostic injections conducted in separate occasions.  Pain Score: Pre-procedure: 8 /10 Post-procedure: 7 /10  Pre-op H&P Assessment:  Ernest [name] is a 65 y.o. (year old), male patient, seen today for interventional treatment. He  has a past surgical history that includes Testicle surgery. Ernest [name] has a current medication list which includes the following prescription(s): albuterol, budesonide, budesonide-formoterol, finasteride, ipratropium-albuterol, meloxicam, sertraline, sildenafil, tramadol, and gabapentin, and the following Facility-Administered Medications: midazolam. His primarily concern today is the Back Pain (lower)  Initial Vital Signs:  Pulse/HCG Rate: 87ECG Heart Rate: 77 Temp: 97.8 F (36.6 C) Resp: 20 BP: 132/80 SpO2: 100 % (O2 on 3L BNC)  BMI: Estimated body mass index is 30 kg/m as calculated from the following:   Height as of this encounter: 6\' 3"  (1.905 m).   Weight as of this encounter: 240 lb (108.9 kg).  Risk Assessment: Allergies: Reviewed. He is allergic to morphine and related.  Allergy Precautions: None required Coagulopathies: Reviewed. None identified.  Blood-thinner therapy: None at this time Active Infection(s): Reviewed. None identified. Ernest [name] is afebrile  Site Confirmation: Ernest [name] was asked to confirm the procedure and laterality before marking the site Procedure checklist: Completed Consent: Before the procedure and under the influence of no sedative(s), amnesic(s), or anxiolytics, the patient was informed of the treatment options, risks and possible complications. To fulfill our ethical and legal obligations, as recommended by the American Medical Association's Code of Ethics, I have  informed the patient of my clinical impression; the nature and purpose of the treatment or procedure; the risks, benefits, and possible complications  of the intervention; the alternatives, including doing nothing; the risk(s) and benefit(s) of the alternative treatment(s) or procedure(s); and the risk(s) and benefit(s) of doing nothing. The patient was provided information about the general risks and possible complications associated with the procedure. These may include, but are not limited to: failure to achieve desired goals, infection, bleeding, organ or nerve damage, allergic reactions, paralysis, and death. In addition, the patient was informed of those risks and complications associated to Spine-related procedures, such as failure to decrease pain; infection (i.e.: Meningitis, epidural or intraspinal abscess); bleeding (i.e.: epidural hematoma, subarachnoid hemorrhage, or any other type of intraspinal or peri-dural bleeding); organ or nerve damage (i.e.: Any type of peripheral nerve, nerve root, or spinal cord injury) with subsequent damage to sensory, motor, and/or autonomic systems, resulting in permanent pain, numbness, and/or weakness of one or several areas of the body; allergic reactions; (i.e.: anaphylactic reaction); and/or death. Furthermore, the patient was informed of those risks and complications associated with the medications. These include, but are not limited to: allergic reactions (i.e.: anaphylactic or anaphylactoid reaction(s)); adrenal axis suppression; blood sugar elevation that in diabetics may result in ketoacidosis or comma; water retention that in patients with history of congestive heart failure may result in shortness of breath, pulmonary edema, and decompensation with resultant heart failure; weight gain; swelling or edema; medication-induced neural toxicity; particulate matter embolism and blood vessel occlusion with resultant organ, and/or nervous system infarction; and/or  aseptic necrosis of one or more joints. Finally, the patient was informed that Medicine is not an exact science; therefore, there is also the possibility of unforeseen or unpredictable risks and/or possible complications that may result in a catastrophic outcome. The patient indicated having understood very clearly. We have given the patient no guarantees and we have made no promises. Enough time was given to the patient to ask questions, all of which were answered to the patient's satisfaction. Mr. Badolato has indicated that he wanted to continue with the procedure. Attestation: I, the ordering provider, attest that I have discussed with the patient the benefits, risks, side-effects, alternatives, likelihood of achieving goals, and potential problems during recovery for the procedure that I have provided informed consent. Date  Time: 01/18/2021  9:31 AM  Pre-Procedure Preparation:  Monitoring: As per clinic protocol. Respiration, ETCO2, SpO2, BP, heart rate and rhythm monitor placed and checked for adequate function Safety Precautions: Patient was assessed for positional comfort and pressure points before starting the procedure. Time-out: I initiated and conducted the "Time-out" before starting the procedure, as per protocol. The patient was asked to participate by confirming the accuracy of the "Time Out" information. Verification of the correct person, site, and procedure were performed and confirmed by me, the nursing staff, and the patient. "Time-out" conducted as per Joint Commission's Universal Protocol (UP.01.01.01). Time: 1000  Description of Procedure:          Laterality: Left Levels: L3, L4, L5, Medial Branch Level(s), at the L3-4, L4-5, lumbar facet joints. Area Prepped: Lumbosacral DuraPrep (Iodine Povacrylex [0.7% available iodine] and Isopropyl Alcohol, 74% w/w) Safety Precautions: Aspiration looking for blood return was conducted prior to all injections. At no point did we inject any  substances, as a needle was being advanced. Before injecting, the patient was told to immediately notify me if he was experiencing any new onset of "ringing in the ears, or metallic taste in the mouth". No attempts were made at seeking any paresthesias. Safe injection practices and needle disposal techniques used. Medications properly checked for  expiration dates. SDV (single dose vial) medications used. After the completion of the procedure, all disposable equipment used was discarded in the proper designated medical waste containers. Local Anesthesia: Protocol guidelines were followed. The patient was positioned over the fluoroscopy table. The area was prepped in the usual manner. The time-out was completed. The target area was identified using fluoroscopy. A 12-in long, straight, sterile hemostat was used with fluoroscopic guidance to locate the targets for each level blocked. Once located, the skin was marked with an approved surgical skin marker. Once all sites were marked, the skin (epidermis, dermis, and hypodermis), as well as deeper tissues (fat, connective tissue and muscle) were infiltrated with a small amount of a short-acting local anesthetic, loaded on a 10cc syringe with a 25G, 1.5-in  Needle. An appropriate amount of time was allowed for local anesthetics to take effect before proceeding to the next step. Local Anesthetic: Lidocaine 2.0% The unused portion of the local anesthetic was discarded in the proper designated containers. Technical explanation of process:  Radiofrequency Ablation (RFA)  L3 Medial Branch Nerve RFA: The target area for the L3 medial branch is at the junction of the postero-lateral aspect of the superior articular process and the superior, posterior, and medial edge of the transverse process of L4. Under fluoroscopic guidance, a Radiofrequency needle was inserted until contact was made with os over the superior postero-lateral aspect of the pedicular shadow (target area).  Sensory and motor testing was conducted to properly adjust the position of the needle. Once satisfactory placement of the needle was achieved, the numbing solution was slowly injected after negative aspiration for blood. 2.0 mL of the nerve block solution was injected without difficulty or complication. After waiting for at least 3 minutes, the ablation was performed. Once completed, the needle was removed intact. L4 Medial Branch Nerve RFA: The target area for the L4 medial branch is at the junction of the postero-lateral aspect of the superior articular process and the superior, posterior, and medial edge of the transverse process of L5. Under fluoroscopic guidance, a Radiofrequency needle was inserted until contact was made with os over the superior postero-lateral aspect of the pedicular shadow (target area). Sensory and motor testing was conducted to properly adjust the position of the needle. Once satisfactory placement of the needle was achieved, the numbing solution was slowly injected after negative aspiration for blood. 2.0 mL of the nerve block solution was injected without difficulty or complication. After waiting for at least 3 minutes, the ablation was performed. Once completed, the needle was removed intact. L5 Medial Branch Nerve RFA: The target area for the L5 medial branch is at the junction of the postero-lateral aspect of the superior articular process of S1 and the superior, posterior, and medial edge of the sacral ala. Under fluoroscopic guidance, a Radiofrequency needle was inserted until contact was made with os over the superior postero-lateral aspect of the pedicular shadow (target area). Sensory and motor testing was conducted to properly adjust the position of the needle. Once satisfactory placement of the needle was achieved, the numbing solution was slowly injected after negative aspiration for blood. 2.0 mL of the nerve block solution was injected without difficulty or complication.  After waiting for at least 3 minutes, the ablation was performed. Once completed, the needle was removed intact.  Radiofrequency lesioning (ablation):  Radiofrequency Generator: NeuroTherm [id number] Sensory Stimulation Parameters: 50 Hz was used to locate & identify the nerve, making sure that the needle was positioned such that there was no  sensory stimulation below 0.3 V or above 0.7 V. Motor Stimulation Parameters: 2 Hz was used to evaluate the motor component. Care was taken not to lesion any nerves that demonstrated motor stimulation of the lower extremities at an output of less than 2.5 times that of the sensory threshold, or a maximum of 2.0 V. Lesioning Technique Parameters: Standard Radiofrequency settings. (Not bipolar or pulsed.) Temperature Settings: 80 degrees C Lesioning time: 60 seconds Intra-operative Compliance: Compliant Materials & Medications: Needle(s) (Electrode/Cannula) Type: Teflon-coated, curved tip, Radiofrequency needle(s) Gauge: 22G Length: 10cm Numbing solution: 8 cc solution made of 7 cc of 0.2% ropivacaine, 1 cc of Decadron 10 mg/cc.  2 cc injected at each level above on the right after sensorimotor testing, prior to lesioning.  The unused portion of the solution was discarded in the proper designated containers.  Once the entire procedure was completed, the treated area was cleaned, making sure to leave some of the prepping solution back to take advantage of its long term bactericidal properties.  Illustration of the posterior view of the lumbar spine and the posterior neural structures. Laminae of L2 through S1 are labeled. DPRL5, dorsal primary ramus of L5; DPRS1, dorsal primary ramus of S1; DPR3, dorsal primary ramus of L3; FJ, facet (zygapophyseal) joint L3-L4; I, inferior articular process of L4; LB1, lateral branch of dorsal primary ramus of L1; IAB, inferior articular branches from L3 medial branch (supplies L4-L5 facet joint); IBP, intermediate branch plexus;  MB3, medial branch of dorsal primary ramus of L3; NR3, third lumbar nerve root; S, superior articular process of L5; SAB, superior articular branches from L4 (supplies L4-5 facet joint also); TP3, transverse process of L3.  Vitals:   01/18/21 1018 01/18/21 1027 01/18/21 1037 01/18/21 1047  BP: (!) 132/98 122/81 (!) 125/92 125/86  Pulse: 83     Resp: 17 17 (!) 22 16  Temp:  98.3 F (36.8 C)  98 F (36.7 C)  TempSrc:  Temporal  Temporal  SpO2: 97% 100% 100% 99%  Weight:      Height:       Start Time: 1000 hrs. End Time: 1017 hrs.  Imaging Guidance (Spinal):          Type of Imaging Technique: Fluoroscopy Guidance (Spinal) Indication(s): Assistance in needle guidance and placement for procedures requiring needle placement in or near specific anatomical locations not easily accessible without such assistance. Exposure Time: Please see nurses notes. Contrast: None used. Fluoroscopic Guidance: I was personally present during the use of fluoroscopy. "Tunnel Vision Technique" used to obtain the best possible view of the target area. Parallax error corrected before commencing the procedure. "Direction-depth-direction" technique used to introduce the needle under continuous pulsed fluoroscopy. Once target was reached, antero-posterior, oblique, and lateral fluoroscopic projection used confirm needle placement in all planes. Images permanently stored in EMR. Interpretation: No contrast injected. I personally interpreted the imaging intraoperatively. Adequate needle placement confirmed in multiple planes. Permanent images saved into the patient's record.    Post-operative Assessment:  Post-procedure Vital Signs:  Pulse/HCG Rate: 83 (nsr)79 Temp: 98 F (36.7 C) Resp: 16 BP: 125/86 SpO2: 99 %  EBL: None  Complications: No immediate post-treatment complications observed by team, or reported by patient.  Note: The patient tolerated the entire procedure well. A repeat set of vitals were taken  after the procedure and the patient was kept under observation following institutional policy, for this type of procedure. Post-procedural neurological assessment was performed, showing return to baseline, prior to discharge. The patient was provided  with post-procedure discharge instructions, including a section on how to identify potential problems. Should any problems arise concerning this procedure, the patient was given instructions to immediately contact us, at any time, without hesitation. In any case, we plan to contact the patient by telephone for a follow-up status report regarding this interventional procedure.  Comments:  No additional relevant information.  5 out of 5 strength bilateral lower extremity: Plantar flexion, dorsiflexion, knee flexion, knee extension.  Plan of Care  Orders:  Orders Placed This Encounter  Procedures  . [facility] C-ARM 1-60 MIN NO REPORT    Intraoperative interpretation by procedural physician at [facility].    Standing Status:   Standing    Number of Occurrences:   1    Order Specific Question:   Reason for exam:    Answer:   Assistance in needle guidance and placement for procedures requiring needle placement in or near specific anatomical locations not easily accessible without such assistance.   Medications ordered for procedure: Meds ordered this encounter  Medications  . lidocaine (XYLOCAINE) 2 % (with pres) injection 400 mg  . midazolam (VERSED) 5 MG/5ML injection 1-2 mg    Make sure Flumazenil is available in the pyxis when using this medication. If oversedation occurs, administer 0.2 mg IV over 15 sec. If after 45 sec no response, administer 0.2 mg again over 1 min; may repeat at 1 min intervals; not to exceed 4 doses (1 mg)  . ropivacaine (PF) 2 mg/mL (0.2%) (NAROPIN) injection 9 mL  . dexamethasone (DECADRON) injection 10 mg   Medications administered: We administered lidocaine, midazolam, ropivacaine (PF) 2 mg/mL (0.2%),  and dexamethasone.  See the medical record for exact dosing, route, and time of administration.  Follow-up plan:   Return in about 6 weeks (around 03/01/2021) for Post Procedure Evaluation, virtual.      Status post bilateral L3, L4, L5 facet medial branch nerve block on 09/14/2020, 80% pain relief for 5 days with improvement, 10/14/2020 60% pain relief for approximately 3 days.  Right L3, L4, L5 RFA 01/04/2021, left L3, L4, L5 RFA 01/18/2021     Recent Visits Date Type Provider Dept  01/04/21 Procedure visit Gillis Santa, MD [facility]  12/31/20 Office Visit Gillis Santa, MD [facility]  11/11/20 Office Visit Gillis Santa, MD [facility]  Showing recent visits within past 90 days and meeting all other requirements Today's Visits Date Type Provider Dept  01/18/21 Procedure visit Gillis Santa, MD [facility]  Showing today's visits and meeting all other requirements Future Appointments Date Type Provider Dept  03/01/21 Appointment Gillis Santa, MD [facility]  Showing future appointments within next 90 days and meeting all other requirements  Disposition: Discharge home  Discharge (Date  Time): 01/18/2021; 1049 hrs.   Primary Care Physician: Tracie Harrier, MD Location: [facility] Note by: Gillis Santa, MD Date: 01/18/2021; Time: 11:31 AM  Disclaimer:  Medicine is not an exact science. The only guarantee in medicine is that nothing is guaranteed. It is important to note that the decision to proceed with this intervention was based on the information collected from the patient. The Data and conclusions were drawn from the patient's questionnaire, the interview, and the physical examination. Because the information was provided in large part by the patient, it cannot be guaranteed that it has not been purposely or unconsciously manipulated. Every effort has been made to obtain as much relevant data as possible  for this evaluation. It is important to note that the conclusions that lead to this procedure are derived in large part from the available data. Always take into account that the treatment will also be dependent on availability of resources and existing treatment guidelines, considered by other Pain Management Practitioners as being common knowledge and practice, at the time of the intervention. For Medico-Legal purposes, it is also important to point out that variation in procedural techniques and pharmacological choices are the acceptable norm. The indications, contraindications, technique, and results of the above procedure should only be interpreted and judged by a Board-Certified Interventional Pain Specialist with extensive familiarity and expertise in the same exact procedure and technique.

## 2021-01-18 NOTE — Progress Notes (Signed)
Safety precautions to be maintained throughout the outpatient stay will include: orient to surroundings, keep bed in low position, maintain call bell within reach at all times, provide assistance with transfer out of bed and ambulation.  

## 2021-01-18 NOTE — Patient Instructions (Signed)

## 2021-01-19 ENCOUNTER — Telehealth: Payer: Self-pay | Admitting: *Deleted

## 2021-01-19 NOTE — Telephone Encounter (Signed)
Attempted to call for post procedure follow-up. No answer, unable to leave a message. 

## 2021-02-25 ENCOUNTER — Telehealth: Payer: Self-pay

## 2021-02-25 NOTE — Telephone Encounter (Signed)
Attempted to call patient for pre virtual appointment questions.  The message said that this call could not be completed at this time.  Unable to leave a message.

## 2021-02-26 NOTE — Telephone Encounter (Signed)
Attempted again to call patient for pre virtual appointment questions.  The message states that the call cant be completed at this time.

## 2021-03-01 ENCOUNTER — Telehealth: Payer: Self-pay

## 2021-03-01 ENCOUNTER — Ambulatory Visit
Payer: Medicare HMO | Attending: Student in an Organized Health Care Education/Training Program | Admitting: Student in an Organized Health Care Education/Training Program

## 2021-03-01 ENCOUNTER — Other Ambulatory Visit: Payer: Self-pay

## 2021-03-01 DIAGNOSIS — M47816 Spondylosis without myelopathy or radiculopathy, lumbar region: Secondary | ICD-10-CM

## 2021-03-01 NOTE — Progress Notes (Signed)
I attempted to call the patient however no response. Voicemail left instructing patient to call front desk office at [phone] to reschedule appointment. -Dr Lateef  

## 2021-03-01 NOTE — Telephone Encounter (Signed)
Attempted to call patient for pre virtual appointment questions.  No answer.

## 2021-05-17 ENCOUNTER — Other Ambulatory Visit: Payer: Self-pay | Admitting: *Deleted

## 2021-05-17 DIAGNOSIS — C61 Malignant neoplasm of prostate: Secondary | ICD-10-CM

## 2021-06-10 ENCOUNTER — Ambulatory Visit: Payer: Medicare HMO | Admitting: Student in an Organized Health Care Education/Training Program

## 2021-06-15 ENCOUNTER — Encounter: Payer: Self-pay | Admitting: Urology

## 2021-06-15 ENCOUNTER — Other Ambulatory Visit: Payer: Self-pay

## 2021-06-22 ENCOUNTER — Ambulatory Visit: Payer: Self-pay | Admitting: Urology

## 2021-06-25 ENCOUNTER — Encounter: Payer: Self-pay | Admitting: Urology

## 2021-07-13 NOTE — Progress Notes (Incomplete)
07/14/2021 1:30 PM   Ernest [name] [birth year] [id number]  Referring provider: Tracie Harrier, MD [address] [facility] [place],  [place] [address] No chief complaint on file.   HPI: Ernest [name] is a 65 y.o. 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 IPSS, and PVR.   Patient has multiple medial comorbidity and is a poor surgical candidate for both consideration of aloe procedure as well as treatment of his prostate cancer.   In 2019 patient was followed by Dr. John Giovanni for surveillance of prostate cancer. 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  PSA today:  IPSS today: PVR today:  PSA Trend: 06/25/2018: 8.7. 06/19/2019: 10.21. 12/19/2019: 9.91. 02/04/2020: 10.45. 02/05/2020: 11.30. 12/18/20:  8.5     PMH: Past Medical History:  Diagnosis Date   Acute on chronic respiratory failure with hypoxia ([place]) 05/25/2017   Acute respiratory failure with hypoxia ([place]) 05/25/2017   COPD (chronic obstructive pulmonary disease) (HCC)    COPD with acute bronchitis ([place]) 05/25/2017   COPD with acute exacerbation ([place]) 05/25/2017   Diabetes mellitus without complication (HCC)    Leukocytosis 05/25/2017   Prostate cancer [facility])     Surgical History: Past Surgical History:  Procedure Laterality Date   TESTICLE SURGERY     undecended testicle repair    Home Medications:  Allergies as of 07/14/2021       Reactions   Morphine And Related Itching        Medication List        Accurate as of July 13, 2021  1:30 PM. If you have any questions, ask your nurse or doctor.          albuterol 108 (90 Base) MCG/ACT inhaler Commonly known as: VENTOLIN HFA Inhale 2 puffs into the lungs every 4 (four) hours as needed for wheezing or  shortness of breath.   budesonide 0.5 MG/2ML nebulizer solution Commonly known as: PULMICORT Take 0.5 mg by nebulization 2 (two) times daily.   budesonide-formoterol 160-4.5 MCG/ACT inhaler Commonly known as: Symbicort Inhale 2 puffs into the lungs 2 (two) times daily.   finasteride 5 MG tablet Commonly known as: PROSCAR Take 5 mg by mouth daily.   gabapentin 300 MG capsule Commonly known as: Neurontin Take 1 capsule (300 mg total) by mouth 2 (two) times daily.   ipratropium-albuterol 0.5-2.5 (3) MG/3ML Soln Commonly known as: DUONEB Take 3 mLs by nebulization 4 (four) times daily.   meloxicam 15 MG tablet Commonly known as: MOBIC Take 15 mg by mouth daily.   sertraline 50 MG tablet Commonly known as: ZOLOFT Take 50 mg by mouth daily.   sildenafil 20 MG tablet Commonly known as: REVATIO Take 20 mg by mouth daily as needed.        Allergies:  Allergies  Allergen Reactions   Morphine And Related Itching    Family History: Family History  Problem Relation Age of Onset   Stroke Mother    Breast cancer Mother    Bladder Cancer Neg Hx    Kidney cancer Neg Hx    Prostate cancer Neg Hx     Social History:  reports that he has been smoking cigarettes. He has a 50.00 pack-year smoking history. He has never used smokeless tobacco. He reports current alcohol use. He reports  that he does not use drugs.   Physical Exam: There were no vitals taken for this visit.  Constitutional:  Alert and oriented, No acute distress. HEENT: [place] AT, moist mucus membranes.  Trachea midline, no masses. Cardiovascular: No clubbing, cyanosis, or edema. Respiratory: Normal respiratory effort, no increased work of breathing. GI: Abdomen is soft, nontender, nondistended, no abdominal masses GU: No CVA tenderness Lymph: No cervical or inguinal lymphadenopathy. Skin: No rashes, bruises or suspicious lesions. Neurologic: Grossly intact, no focal deficits, moving all 4  extremities. Psychiatric: Normal mood and affect.  Laboratory Data:  Lab Results  Component Value Date   CREATININE 0.94 09/03/2020    No results found for: PSA  No results found for: TESTOSTERONE  Lab Results  Component Value Date   HGBA1C 6.4 (H) 09/03/2020    Urinalysis   Pertinent Imaging:   I have personally reviewed the images and agree with radiologist interpretation.    Assessment & Plan:   Prostate cancer  Benign prostatic hyperplasia   No follow-ups on file.  I,Kailey Littlejohn,acting as a Education administrator for Hollice Espy, MD.,have documented all relevant documentation on the behalf of Hollice Espy, MD,as directed by  Hollice Espy, MD while in the presence of Hollice Espy, [place] [address] [place], [place] [address] [phone]

## 2021-07-14 ENCOUNTER — Ambulatory Visit: Payer: Medicare HMO | Admitting: Urology

## 2021-07-15 ENCOUNTER — Encounter: Payer: Self-pay | Admitting: Urology

## 2021-07-27 ENCOUNTER — Other Ambulatory Visit: Payer: Self-pay | Admitting: Surgery

## 2021-07-29 ENCOUNTER — Ambulatory Visit: Payer: Medicare HMO | Admitting: Student in an Organized Health Care Education/Training Program

## 2021-07-30 LAB — SURGICAL PATHOLOGY

## 2021-08-09 ENCOUNTER — Emergency Department
Admission: EM | Admit: 2021-08-09 | Discharge: 2021-08-09 | Disposition: A | Discharge status: Home / Self Care | Payer: Medicare HMO | Attending: Student in an Organized Health Care Education/Training Program | Admitting: Student in an Organized Health Care Education/Training Program

## 2021-08-09 ENCOUNTER — Emergency Department: Payer: Medicare HMO

## 2021-08-09 DIAGNOSIS — Z8546 Personal history of malignant neoplasm of prostate: Secondary | ICD-10-CM | POA: Insufficient documentation

## 2021-08-09 DIAGNOSIS — F17210 Nicotine dependence, cigarettes, uncomplicated: Secondary | ICD-10-CM | POA: Diagnosis not present

## 2021-08-09 DIAGNOSIS — Z7951 Long term (current) use of inhaled steroids: Secondary | ICD-10-CM | POA: Diagnosis not present

## 2021-08-09 DIAGNOSIS — J441 Chronic obstructive pulmonary disease with (acute) exacerbation: Secondary | ICD-10-CM | POA: Insufficient documentation

## 2021-08-09 DIAGNOSIS — Z20822 Contact with and (suspected) exposure to covid-19: Secondary | ICD-10-CM | POA: Insufficient documentation

## 2021-08-09 DIAGNOSIS — R0602 Shortness of breath: Secondary | ICD-10-CM | POA: Diagnosis present

## 2021-08-09 DIAGNOSIS — E119 Type 2 diabetes mellitus without complications: Secondary | ICD-10-CM | POA: Diagnosis not present

## 2021-08-09 LAB — COMPREHENSIVE METABOLIC PANEL
ALT: 13 U/L (ref 0–44)
AST: 21 U/L (ref 15–41)
Albumin: 4.1 g/dL (ref 3.5–5.0)
Alkaline Phosphatase: 109 U/L (ref 38–126)
Anion gap: 14 (ref 5–15)
BUN: 21 mg/dL (ref 8–23)
CO2: 19 mmol/L — ABNORMAL LOW (ref 22–32)
Calcium: 9.5 mg/dL (ref 8.9–10.3)
Chloride: 103 mmol/L (ref 98–111)
Creatinine, Ser: 1.09 mg/dL (ref 0.61–1.24)
GFR, Estimated: >60 mL/min (ref >60)
Glucose, Bld: 105 mg/dL — ABNORMAL HIGH (ref 70–99)
Potassium: 4.1 mmol/L (ref 3.5–5.1)
Sodium: 136 mmol/L (ref 135–145)
Total Bilirubin: 0.8 mg/dL (ref 0.3–1.2)
Total Protein: 7.8 g/dL (ref 6.5–8.1)

## 2021-08-09 LAB — CBC WITH DIFFERENTIAL/PLATELET
Abs Immature Granulocytes: 0.04 10*3/uL (ref 0.00–0.07)
Basophils Absolute: 0.1 10*3/uL (ref 0.0–0.1)
Basophils Relative: 1 %
Eosinophils Absolute: 0.3 10*3/uL (ref 0.0–0.5)
Eosinophils Relative: 2 %
HCT: 48.7 % (ref 39.0–52.0)
Hemoglobin: 15.9 g/dL (ref 13.0–17.0)
Immature Granulocytes: 0 %
Lymphocytes Relative: 28 %
Lymphs Abs: 3.3 10*3/uL (ref 0.7–4.0)
MCH: 26.3 pg (ref 26.0–34.0)
MCHC: 32.6 g/dL (ref 30.0–36.0)
MCV: 80.5 fL (ref 80.0–100.0)
Monocytes Absolute: 0.9 10*3/uL (ref 0.1–1.0)
Monocytes Relative: 8 %
Neutro Abs: 7.2 10*3/uL (ref 1.7–7.7)
Neutrophils Relative %: 61 %
Platelets: 446 10*3/uL — ABNORMAL HIGH (ref 150–400)
RBC: 6.05 MIL/uL — ABNORMAL HIGH (ref 4.22–5.81)
RDW: 15.9 % — ABNORMAL HIGH (ref 11.5–15.5)
WBC: 11.8 10*3/uL — ABNORMAL HIGH (ref 4.0–10.5)
nRBC: 0 % (ref 0.0–0.2)

## 2021-08-09 LAB — RESP PANEL BY RT-PCR (FLU A&B, COVID) ARPGX2
Influenza A by PCR: NEGATIVE
Influenza B by PCR: NEGATIVE
SARS Coronavirus 2 by RT PCR: NEGATIVE

## 2021-08-09 MED ORDER — IPRATROPIUM-ALBUTEROL 0.5-2.5 (3) MG/3ML IN SOLN
3.0000 mL | Freq: Once | RESPIRATORY_TRACT | Status: AC
  Administered 2021-08-09: 3 mL via RESPIRATORY_TRACT

## 2021-08-09 MED ORDER — ALBUTEROL SULFATE (2.5 MG/3ML) 0.083% IN NEBU: 2.5000 mg | INHALATION_SOLUTION | RESPIRATORY_TRACT | Status: DC

## 2021-08-09 MED ORDER — METHYLPREDNISOLONE SODIUM SUCC 125 MG IJ SOLR
125.0000 mg | Freq: Once | INTRAMUSCULAR | Status: AC
  Administered 2021-08-09: 125 mg via INTRAVENOUS
  Filled 2021-08-09: qty 2

## 2021-08-09 MED ORDER — ALBUTEROL SULFATE HFA 108 (90 BASE) MCG/ACT IN AERS
2.0000 | INHALATION_SPRAY | RESPIRATORY_TRACT | Status: DC
  Administered 2021-08-09: 2 via RESPIRATORY_TRACT
  Filled 2021-08-09: qty 6.7

## 2021-08-09 MED ORDER — PREDNISONE 20 MG PO TABS: 40.0000 mg | ORAL_TABLET | Freq: Every day | ORAL | 0 refills | Status: AC

## 2021-08-09 MED ORDER — ALBUTEROL SULFATE HFA 108 (90 BASE) MCG/ACT IN AERS: 2.0000 | INHALATION_SPRAY | RESPIRATORY_TRACT | Status: DC

## 2021-08-09 NOTE — ED Notes (Signed)
Pt repositioned in bed rigtht side lying with pillows under left side

## 2021-08-09 NOTE — ED Triage Notes (Addendum)
Pt arrives via POV as visitor for another pt. Pt was observed to be uncontrollably wheezing in hall. Pt is prescribed 3L [place] continuously but has ran out of oxygen at home. MD in room.

## 2021-08-09 NOTE — ED Notes (Addendum)
This RN noticed pt extremely short of breath upon arrival with wife who was patient in [place]. Pt unable to catch breath or speak in complete sentences. Pt immediately wheeled into room 25 and MD Quentin Cornwall at bedside. Breathing treatments ordered and administered by this RN. Report handoff given to primary nurse Dorian. Wife updated on patient status per patient request.Pt unable to answer triage questions initially due to shortness of breath.

## 2021-08-09 NOTE — ED Provider Notes (Signed)
[facility] Emergency Department Provider Note    Event Date/Time   First MD Initiated Contact with Patient 08/09/21 1111     (approximate)  I have reviewed the triage vital signs and the nursing notes.   HISTORY  Chief Complaint Shortness of Breath    HPI Ernest [name] is a 65 y.o. male extensive past medical history as listed below on home oxygen presents to the ER for evaluation of shortness of breath.  Is actually here as he is having his wife evaluated for mental health issue.  Typically uses oxygen concentrator but thinks that the battery ran out in transit.  Denies any pain.  No fevers.  Patient with severe bronchitic sounding cough shortness of breath speaking in short phrases.  Past Medical History:  Diagnosis Date   Acute on chronic respiratory failure with hypoxia ([place]) 05/25/2017   Acute respiratory failure with hypoxia (HCC) 05/25/2017   COPD (chronic obstructive pulmonary disease) (HCC)    COPD with acute bronchitis ([place]) 05/25/2017   COPD with acute exacerbation ([place]) 05/25/2017   Diabetes mellitus without complication (HCC)    Leukocytosis 05/25/2017   Prostate cancer ([place])    Family History  Problem Relation Age of Onset   Stroke Mother    Breast cancer Mother    Bladder Cancer Neg Hx    Kidney cancer Neg Hx    Prostate cancer Neg Hx    Past Surgical History:  Procedure Laterality Date   TESTICLE SURGERY     undecended testicle repair   Patient Active Problem List   Diagnosis Date Noted   Lumbar facet arthropathy 09/08/2020   Spinal stenosis, lumbar region, 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/08/2020   Chronic radicular lumbar pain 09/08/2020   Lumbar degenerative disc disease 09/08/2020   Chronic pain syndrome 09/08/2020   Type 2 diabetes mellitus ([place]) 09/02/2020   COPD exacerbation ([place]) 04/29/2020   Prostate cancer ([place]) 12/27/2017   Elevated PSA 11/01/2017   Benign prostatic hyperplasia with lower urinary tract symptoms  11/01/2017   Acute on chronic respiratory failure with hypoxia ([place]) 05/25/2017   COPD with acute exacerbation ([place]) 05/25/2017   COPD with acute bronchitis ([place]) 05/25/2017   Leukocytosis 05/25/2017      Prior to Admission medications   Medication Sig Start Date End Date Taking? Authorizing Provider  predniSONE (DELTASONE) 20 MG tablet Take 2 tablets (40 mg total) by mouth daily for 5 days. 08/09/21 08/14/21 Yes Merlyn Lot, MD  albuterol (VENTOLIN HFA) 108 (90 Base) MCG/ACT inhaler Inhale 2 puffs into the lungs every 4 (four) hours as needed for wheezing or shortness of breath. 05/02/20   Nicole Kindred A, DO  budesonide (PULMICORT) 0.5 MG/2ML nebulizer solution Take 0.5 mg by nebulization 2 (two) times daily. 10/17/19   [provider]  budesonide-formoterol (SYMBICORT) 160-4.5 MCG/ACT inhaler Inhale 2 puffs into the lungs 2 (two) times daily. 05/29/17   Dustin Flock, MD  finasteride (PROSCAR) 5 MG tablet Take 5 mg by mouth daily. 04/17/20   [provider]  gabapentin (NEURONTIN) 300 MG capsule Take 1 capsule (300 mg total) by mouth 2 (two) times daily. 10/06/20 01/04/21  Gillis Santa, MD  ipratropium-albuterol (DUONEB) 0.5-2.5 (3) MG/3ML SOLN Take 3 mLs by nebulization 4 (four) times daily. 12/17/20   [provider]  meloxicam (MOBIC) 15 MG tablet Take 15 mg by mouth daily. 04/17/20   [provider]  sertraline (ZOLOFT) 50 MG tablet Take 50 mg by mouth daily.    [provider]  sildenafil (REVATIO) 20 MG tablet Take 20 mg by mouth daily as needed. 03/15/20   [provider]    Allergies Morphine and related    Social History Social History   Tobacco Use   Smoking status: Every Day    Packs/day: 1.00    Years: 50.00    Pack years: 50.00    Types: Cigarettes   Smokeless tobacco: Never  Vaping Use   Vaping Use: Never used  Substance Use Topics   Alcohol use: Yes    Comment: occasionally    Drug use: Never    Review  of Systems Patient denies headaches, rhinorrhea, blurry vision, numbness, shortness of breath, chest pain, edema, cough, abdominal pain, nausea, vomiting, diarrhea, dysuria, fevers, rashes or hallucinations unless otherwise stated above in HPI. ____________________________________________   PHYSICAL EXAM:  VITAL SIGNS: Vitals:   08/09/21 1300 08/09/21 1335  BP: 121/79 137/86  Pulse: 84 85  Resp: 13 18  Temp:    SpO2: 97% 97%    Constitutional: Alert and oriented. Eyes: Conjunctivae are normal.  Head: Atraumatic. Nose: No congestion/rhinnorhea. Mouth/Throat: Mucous membranes are moist.   Neck: No stridor. Painless ROM.  Cardiovascular: Normal rate, regular rhythm. Grossly normal heart sounds.  Good peripheral circulation. Respiratory: tachypnea with diffuse tight wheeze throughout Gastrointestinal: Soft and nontender. No distention. No abdominal bruits. No CVA tenderness. Genitourinary:  Musculoskeletal: No lower extremity tenderness nor edema.  No joint effusions. Neurologic:  Normal speech and language. No gross focal neurologic deficits are appreciated. No facial droop Skin:  Skin is warm, dry and intact. No rash noted. Psychiatric: Mood and affect are normal. Speech and behavior are normal.  ____________________________________________   LABS (all labs ordered are listed, but only abnormal results are displayed)  Results for orders placed or performed during the hospital encounter of 08/09/21 (from the past 24 hour(s))  CBC with Differential     Status: Abnormal   Collection Time: 08/09/21 11:16 AM  Result Value Ref Range   WBC 11.8 (H) 4.0 - 10.5 K/uL   RBC 6.05 (H) 4.22 - 5.81 MIL/uL   Hemoglobin 15.9 13.0 - 17.0 g/dL   HCT 48.7 39.0 - 52.0 %   MCV 80.5 80.0 - 100.0 fL   MCH 26.3 26.0 - 34.0 pg   MCHC 32.6 30.0 - 36.0 g/dL   RDW 15.9 (H) 11.5 - 15.5 %   Platelets 446 (H) 150 - 400 K/uL   nRBC 0.0 0.0 - 0.2 %   Neutrophils Relative % 61 %   Neutro Abs 7.2 1.7 -  7.7 K/uL   Lymphocytes Relative 28 %   Lymphs Abs 3.3 0.7 - 4.0 K/uL   Monocytes Relative 8 %   Monocytes Absolute 0.9 0.1 - 1.0 K/uL   Eosinophils Relative 2 %   Eosinophils Absolute 0.3 0.0 - 0.5 K/uL   Basophils Relative 1 %   Basophils Absolute 0.1 0.0 - 0.1 K/uL   Immature Granulocytes 0 %   Abs Immature Granulocytes 0.04 0.00 - 0.07 K/uL  Comprehensive metabolic panel     Status: Abnormal   Collection Time: 08/09/21 11:16 AM  Result Value Ref Range   Sodium 136 135 - 145 mmol/L   Potassium 4.1 3.5 - 5.1 mmol/L   Chloride 103 98 - 111 mmol/L   CO2 19 (L) 22 - 32 mmol/L   Glucose, Bld 105 (H) 70 - 99 mg/dL   BUN 21 8 - 23 mg/dL   Creatinine, Ser 1.09 0.61 - 1.24 mg/dL  Calcium 9.5 8.9 - 10.3 mg/dL   Total Protein 7.8 6.5 - 8.1 g/dL   Albumin 4.1 3.5 - 5.0 g/dL   AST 21 15 - 41 U/L   ALT 13 0 - 44 U/L   Alkaline Phosphatase 109 38 - 126 U/L   Total Bilirubin 0.8 0.3 - 1.2 mg/dL   GFR, Estimated >60 >60 mL/min   Anion gap 14 5 - 15  Resp Panel by RT-PCR (Flu A&B, Covid) Nasopharyngeal Swab     Status: None   Collection Time: 08/09/21 11:16 AM   Specimen: Nasopharyngeal Swab; Nasopharyngeal(NP) swabs in vial transport medium  Result Value Ref Range   SARS Coronavirus 2 by RT PCR NEGATIVE NEGATIVE   Influenza A by PCR NEGATIVE NEGATIVE   Influenza B by PCR NEGATIVE NEGATIVE   ____________________________________________  EKG My review and personal interpretation at Time: 11:15   Indication: sob  Rate: 120  Rhythm: sinus Axis: normal Other: normal intervals, no stemi ____________________________________________  RADIOLOGY  I personally reviewed all radiographic images ordered to evaluate for the above acute complaints and reviewed radiology reports and findings.  These findings were personally discussed with the patient.  Please see medical record for radiology report.  ____________________________________________   PROCEDURES  Procedure(s) performed:   Procedures    Critical Care performed: no ____________________________________________   INITIAL IMPRESSION / ASSESSMENT AND PLAN / ED COURSE  Pertinent labs & imaging results that were available during my care of the patient were reviewed by me and considered in my medical decision making (see chart for details).   DDX: Asthma, copd, CHF, pna, ptx, malignancy, Pe, anemia   Ernest [name] is a 65 y.o. who presents to the ED with shortness of breath and wheezing most consistent with COPD exacerbation.  Patient given nebulizer as well as steroids.  Will observe.  We will check blood work as well as x-ray.  Clinical Course as of 08/09/21 1431  Mon Aug 09, 2021  1202 Patient feeling much improved.  We will continue to observe. [PR]  [id number] Patient feels significantly improved.  States he is also dealing with quite a bit of stress might have a panic attack and she was pending dealing with his wife.  Denies any shortness of breath at this time feels at his baseline.  Do believe he stable and appropriate for outpatient follow-up. [PR]    Clinical Course User Index [PR] Merlyn Lot, MD    The patient was evaluated in Emergency Department today for the symptoms described in the history of present illness. He/she was evaluated in the context of the global COVID-19 pandemic, which necessitated consideration that the patient might be at risk for infection with the SARS-CoV-2 virus that causes COVID-19. Institutional protocols and algorithms that pertain to the evaluation of patients at risk for COVID-19 are in a state of rapid change based on information released by regulatory bodies including the CDC and federal and state organizations. These policies and algorithms were followed during the patient's care in the ED.  As part of my medical decision making, I reviewed the following data within the [place] notes reviewed and incorporated, Labs reviewed, notes from prior ED  visits and [place] Controlled Substance Database   ____________________________________________   FINAL CLINICAL IMPRESSION(S) / ED DIAGNOSES  Final diagnoses:  COPD exacerbation ([place])      NEW MEDICATIONS STARTED DURING THIS VISIT:  New Prescriptions   PREDNISONE (DELTASONE) 20 MG TABLET    Take 2 tablets (40 mg  total) by mouth daily for 5 days.     Note:  This document was prepared using Dragon voice recognition software and may include unintentional dictation errors.    Merlyn Lot, MD 08/09/21 [phone]

## 2022-03-04 ENCOUNTER — Telehealth: Payer: Self-pay

## 2022-03-04 NOTE — Telephone Encounter (Signed)
Called to schedule annual LDCT.  No answer and VM full. ?

## 2022-04-19 ENCOUNTER — Ambulatory Visit (INDEPENDENT_AMBULATORY_CARE_PROVIDER_SITE_OTHER): Payer: Medicare HMO | Admitting: Urology

## 2022-04-19 ENCOUNTER — Encounter: Payer: Self-pay | Admitting: Urology

## 2022-04-19 VITALS — BP 150/76 | HR 98 | Ht 75.0 in | Wt 232.0 lb

## 2022-04-19 DIAGNOSIS — R350 Frequency of micturition: Secondary | ICD-10-CM

## 2022-04-19 DIAGNOSIS — R300 Dysuria: Secondary | ICD-10-CM | POA: Diagnosis not present

## 2022-04-19 DIAGNOSIS — C61 Malignant neoplasm of prostate: Secondary | ICD-10-CM

## 2022-04-19 DIAGNOSIS — R339 Retention of urine, unspecified: Secondary | ICD-10-CM

## 2022-04-19 DIAGNOSIS — R3989 Other symptoms and signs involving the genitourinary system: Secondary | ICD-10-CM

## 2022-04-19 DIAGNOSIS — R3915 Urgency of urination: Secondary | ICD-10-CM

## 2022-04-19 DIAGNOSIS — Z8546 Personal history of malignant neoplasm of prostate: Secondary | ICD-10-CM

## 2022-04-19 LAB — URINALYSIS, COMPLETE
Bilirubin, UA: NEGATIVE
Glucose, UA: NEGATIVE
Ketones, UA: NEGATIVE
Nitrite, UA: NEGATIVE
Specific Gravity, UA: >1.03 — ABNORMAL HIGH (ref 1.005–1.030)
Urobilinogen, Ur: 1 mg/dL (ref 0.2–1.0)
pH, UA: 6 (ref 5.0–7.5)

## 2022-04-19 LAB — MICROSCOPIC EXAMINATION
RBC, Urine: >30 /hpf — AB (ref 0–2)
WBC, UA: >30 /hpf — AB (ref 0–5)

## 2022-04-19 LAB — BLADDER SCAN AMB NON-IMAGING

## 2022-04-19 MED ORDER — SULFAMETHOXAZOLE-TRIMETHOPRIM 800-160 MG PO TABS
1.0000 | ORAL_TABLET | Freq: Two times a day (BID) | ORAL | 0 refills | Status: DC
Start: 2022-04-19 — End: 2022-08-30

## 2022-04-19 NOTE — Progress Notes (Signed)
? ? ?04/19/2022 ?12:52 PM  ? ?Ernest [name] ?[birth year] ?[id number] ? ?Referring provider: Tracie Harrier, MD ?[place] ?[facility] ?[place],  [place] [address] ? ?Chief Complaint  ?Patient presents with  ? Urinary Retention  ? ?Urological history: ?1.  Prostate cancer ?-last PSA 12/2020 8.5 ?-Patient was followed by Dr. Bernardo Heater in 2019 for surveillance regarding prostate cancer. 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 ? ?2. Nephrolithiasis ?-CT renal stone study 2019 - Small nonobstructive right renal calculus is noted ? ?3. Undescended testicle  ?-remote history of orchiopexy  ? ?4. ED ?-contributing factors of age, prostate cancer, COPD, diabetes, BPH, spinal stenosis,  ? ? ?HPI: ?Ernest [name] is a 66 y.o. male who presented to the front desk with having difficulty urinating, says he feels like he has to go all the time but can't.  It's been going on for 3 days now. ?   ?For the last 3 days, he has been experiencing dysuria, frequency and urgency.  Patient denies any modifying or aggravating factors.  Patient denies any gross hematuria or suprapubic/flank pain.  Patient denies any fevers, chills, nausea or vomiting.   ? ?UA greater than 30 WBCs, greater than 30 RBCs and many bacteria ? ?PVR 14 mL  ? ?PMH: ?Past Medical History:  ?Diagnosis Date  ? Acute on chronic respiratory failure with hypoxia ([place]) 05/25/2017  ? Acute respiratory failure with hypoxia ([place]) 05/25/2017  ? COPD (chronic obstructive pulmonary disease) ([place])   ? COPD with acute bronchitis ([place]) 05/25/2017  ? COPD with acute exacerbation ([place]) 05/25/2017  ? Diabetes mellitus without complication ([place])   ? Leukocytosis 05/25/2017  ? Prostate cancer ([place])   ? ? ?Surgical History: ?Past Surgical History:  ?Procedure Laterality Date  ? TESTICLE SURGERY    ? undecended testicle repair  ? ? ?Home  Medications:  ?Allergies as of 04/19/2022   ? ?   Reactions  ? Morphine And Related Itching  ? ?  ? ?  ?Medication List  ?  ? ?  ? Accurate as of Apr 19, 2022 12:52 PM. If you have any questions, ask your nurse or doctor.  ?  ?  ? ?  ? ?albuterol 108 (90 Base) MCG/ACT inhaler ?Commonly known as: VENTOLIN HFA ?Inhale 2 puffs into the lungs every 4 (four) hours as needed for wheezing or shortness of breath. ?  ?budesonide 0.5 MG/2ML nebulizer solution ?Commonly known as: PULMICORT ?Take 0.5 mg by nebulization 2 (two) times daily. ?  ?budesonide-formoterol 160-4.5 MCG/ACT inhaler ?Commonly known as: Symbicort ?Inhale 2 puffs into the lungs 2 (two) times daily. ?  ?finasteride 5 MG tablet ?Commonly known as: PROSCAR ?Take 5 mg by mouth daily. ?  ?gabapentin 300 MG capsule ?Commonly known as: Neurontin ?Take 1 capsule (300 mg total) by mouth 2 (two) times daily. ?  ?ipratropium-albuterol 0.5-2.5 (3) MG/3ML Soln ?Commonly known as: DUONEB ?Take 3 mLs by nebulization 4 (four) times daily. ?  ?meloxicam 15 MG tablet ?Commonly known as: MOBIC ?Take 15 mg by mouth daily. ?  ?sertraline 50 MG tablet ?Commonly known as: ZOLOFT ?Take 50 mg by mouth daily. ?  ?sildenafil 20 MG tablet ?Commonly known as: REVATIO ?Take 20 mg by mouth daily as needed. ?  ?sulfamethoxazole-trimethoprim 800-160 MG tablet ?Commonly known as: BACTRIM DS ?Take 1 tablet by mouth every 12 (twelve) hours. ?  ? ?  ? ? ?  Allergies:  ?Allergies  ?Allergen Reactions  ? Morphine And Related Itching  ? ? ?Family History: ?Family History  ?Problem Relation Age of Onset  ? Stroke Mother   ? Breast cancer Mother   ? Bladder Cancer Neg Hx   ? Kidney cancer Neg Hx   ? Prostate cancer Neg Hx   ? ? ?Social History:  reports that he has been smoking cigarettes. He has a 50.00 pack-year smoking history. He has never used smokeless tobacco. He reports current alcohol use. He reports that he does not use drugs. ? ?ROS: ?Pertinent ROS in HPI ? ?Physical Exam: ?BP (!) 150/76    Pulse 98   Ht '6\' 3"'$  (1.905 m)   Wt 232 lb (105.2 kg)   BMI 29.00 kg/m?   ?Constitutional:  Well nourished. Alert and oriented, No acute distress. ?HEENT: [place] AT, moist mucus membranes.  Trachea midline, no masses. ?Cardiovascular: No clubbing, cyanosis, or edema. ?Respiratory: Normal respiratory effort, no increased work of breathing. ?Neurologic: Grossly intact, no focal deficits, moving all 4 extremities. ?Psychiatric: Normal mood and affect. ? ?Laboratory Data: ? ?  Latest Ref Rng & Units 08/09/2021  ? 11:16 AM 09/03/2020  ?  5:14 AM 09/02/2020  ?  1:05 PM  ?CMP  ?Glucose 70 - 99 mg/dL 105   180   93    ?BUN 8 - 23 mg/dL '21   21   15    '$ ?Creatinine 0.61 - 1.24 mg/dL 1.09   0.94   0.90    ?Sodium 135 - 145 mmol/L 136   138   138    ?Potassium 3.5 - 5.1 mmol/L 4.1   4.3   3.7    ?Chloride 98 - 111 mmol/L 103   106   107    ?CO2 22 - 32 mmol/L '19   22   18    '$ ?Calcium 8.9 - 10.3 mg/dL 9.5   9.2   9.0    ?Total Protein 6.5 - 8.1 g/dL 7.8    6.7    ?Total Bilirubin 0.3 - 1.2 mg/dL 0.8    0.5    ?Alkaline Phos 38 - 126 U/L 109    63    ?AST 15 - 41 U/L 21    19    ?ALT 0 - 44 U/L 13    18    ?  ? ?  Latest Ref Rng & Units 08/09/2021  ? 11:16 AM 09/03/2020  ?  5:14 AM 09/02/2020  ?  1:05 PM  ?CBC  ?WBC 4.0 - 10.5 K/uL 11.8   14.2   9.0    ?Hemoglobin 13.0 - 17.0 g/dL 15.9   14.8   15.2    ?Hematocrit 39.0 - 52.0 % 48.7   44.4   47.1    ?Platelets 150 - 400 K/uL 446   382   357    ?  ? Prostate Specific Ag, Serum  ?Latest Ref Rng 0.0 - 4.0 ng/mL  ?11/01/2017 7.1 (H)   ?03/28/2018 8.6 (H)   ?06/25/2018 8.7 (H)   ?12/18/2020 8.5 (H)   ?  ?(H) High ? ?Urinalysis ?Results for orders placed or performed in visit on 04/19/22  ?Microscopic Examination  ? Urine  ?Result Value Ref Range  ? WBC, UA >30 (A) 0 - 5 /hpf  ? RBC >30 (A) 0 - 2 /hpf  ? Epithelial Cells (non renal) 0-10 0 - 10 /hpf  ? Bacteria, UA Many (A) None seen/Few  ?Urinalysis, Complete  ?Result  Value Ref Range  ? Specific Gravity, UA >1.030 (H) 1.005 - 1.030  ? pH, UA 6.0  5.0 - 7.5  ? Color, UA Yellow Yellow  ? Appearance Ur Cloudy (A) Clear  ? Leukocytes,UA 3+ (A) Negative  ? Protein,UA 3+ (A) Negative/Trace  ? Glucose, UA Negative Negative  ? Ketones, UA Negative Negative  ? RBC, UA 3+ (A) Negative  ? Bilirubin, UA Negative Negative  ? Urobilinogen, Ur 1.0 0.2 - 1.0 mg/dL  ? Nitrite, UA Negative Negative  ? Microscopic Examination See below:   ?Bladder Scan (Post Void Residual) in office  ?Result Value Ref Range  ? Scan Result 43m   ?I have reviewed the labs. ? ? ?Pertinent Imaging: ? 04/19/22 11:28  ?Scan Result 12m ? ?Assessment & Plan:   ? ?1. Suspected UTI ?-UA grossly infected ?-urine culture ?-Septra DS, twice daily for seven days ? ?2. Prostate cancer ?-has not had a PSA in over one year ?-if UTI has resolved at monthly follow up, will check his PSA as infection can falsely elevated PSA's  ? ?Return in about 1 month (around 05/20/2022) for ua and symptom recheck. ? ?These notes generated with voice recognition software. I apologize for typographical errors. ? ?Guida Asman, PA-C ? ?[id number] [phone]  ?

## 2022-04-22 LAB — CULTURE, URINE COMPREHENSIVE

## 2022-05-19 NOTE — Progress Notes (Signed)
05/20/2022 11:31 AM   Ernest [name] August 26, 1956 [id number]  Referring provider: Tracie Harrier, MD [address] [facility] [place],  [place] [address]  Chief Complaint  Patient presents with   Prostate Cancer   Urological history: 1.  Prostate cancer -PSA pending -Patient was followed by Dr. Bernardo Heater in 2019 for surveillance regarding prostate cancer. 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  2. Nephrolithiasis -CT renal stone study 2019 - Small nonobstructive right renal calculus is noted  3. Undescended testicle  -remote history of orchiopexy   4. ED -contributing factors of age, prostate cancer, COPD, diabetes, BPH, spinal stenosis and smoking.   HPI: Ernest [name] is a 66 y.o. male who presents today for follow up after an UTI with micro heme.   For the last 3 days, he has been experiencing dysuria, frequency and urgency.  UA greater than 30 WBCs, greater than 30 RBCs and many bacteria.  Urine culture was positive for E. coli.  PVR 14 mL.  He was treated with culture appropriate antibiotics.  He feels his UTI symptoms have improved.  Patient denies any modifying or aggravating factors.  Patient denies any gross hematuria, dysuria or suprapubic/flank pain.  Patient denies any fevers, chills, nausea or vomiting.    His UA is benign.    He is having life stressors at this moment as he is currently homeless and he and his wife are living in a hotel.  He was living with his son, but his son has since moved and he and his wife were not able to move with him.  He states that he makes too much money to qualify for HUD housing and since he has bad credit, he has not been able to find an apartment or house to rent to him.  PMH: Past Medical History:  Diagnosis Date   Acute on chronic respiratory failure with hypoxia ([place])  05/25/2017   Acute respiratory failure with hypoxia ([place]) 05/25/2017   COPD (chronic obstructive pulmonary disease) (HCC)    COPD with acute bronchitis ([place]) 05/25/2017   COPD with acute exacerbation ([place]) 05/25/2017   Diabetes mellitus without complication (HCC)    Leukocytosis 05/25/2017   Prostate cancer [facility])     Surgical History: Past Surgical History:  Procedure Laterality Date   TESTICLE SURGERY     undecended testicle repair    Home Medications:  Allergies as of 05/20/2022       Reactions   Morphine And Related Itching        Medication List        Accurate as of May 20, 2022 11:31 AM. If you have any questions, ask your nurse or doctor.          albuterol 108 (90 Base) MCG/ACT inhaler Commonly known as: VENTOLIN HFA Inhale 2 puffs into the lungs every 4 (four) hours as needed for wheezing or shortness of breath.   budesonide 0.5 MG/2ML nebulizer solution Commonly known as: PULMICORT Take 0.5 mg by nebulization 2 (two) times daily.   budesonide-formoterol 160-4.5 MCG/ACT inhaler Commonly known as: Symbicort Inhale 2 puffs into the lungs 2 (two) times daily.   finasteride 5 MG tablet Commonly known as: PROSCAR Take 5 mg by mouth daily.   gabapentin 300 MG capsule Commonly known as: Neurontin Take 1 capsule (300 mg total) by mouth 2 (two) times daily.   ipratropium-albuterol  0.5-2.5 (3) MG/3ML Soln Commonly known as: DUONEB Take 3 mLs by nebulization 4 (four) times daily.   meloxicam 15 MG tablet Commonly known as: MOBIC Take 15 mg by mouth daily.   sertraline 50 MG tablet Commonly known as: ZOLOFT Take 50 mg by mouth daily.   sildenafil 20 MG tablet Commonly known as: REVATIO Take 20 mg by mouth daily as needed.   sulfamethoxazole-trimethoprim 800-160 MG tablet Commonly known as: BACTRIM DS Take 1 tablet by mouth every 12 (twelve) hours.        Allergies:  Allergies  Allergen Reactions   Morphine And Related Itching    Family  History: Family History  Problem Relation Age of Onset   Stroke Mother    Breast cancer Mother    Bladder Cancer Neg Hx    Kidney cancer Neg Hx    Prostate cancer Neg Hx     Social History:  reports that he has been smoking cigarettes. He has a 50.00 pack-year smoking history. He has never used smokeless tobacco. He reports current alcohol use. He reports that he does not use drugs.  ROS: Pertinent ROS in HPI  Physical Exam: BP 136/78   Pulse 83   Ht '6\' 3"'$  (1.905 m)   Wt 234 lb (106.1 kg)   BMI 29.25 kg/m   Constitutional:  Well nourished. Alert and oriented, No acute distress. HEENT: [place] AT, moist mucus membranes.  Trachea midline Cardiovascular: No clubbing, cyanosis, or edema. Respiratory: Normal respiratory effort, no increased work of breathing.  On oxygen. Neurologic: Grossly intact, no focal deficits, moving all 4 extremities. Psychiatric: Normal mood and affect.   Laboratory Data:  Urinalysis See Epic. I have reviewed the labs.   Pertinent Imaging: N/A  Assessment & Plan:    1. UTI -resolved  2. Prostate cancer -PSA pending  Return for pending PSA results .  These notes generated with voice recognition software. I apologize for typographical errors.  Ernest Council, PA-C  [facility] [address]  [place],  [address] [phone]

## 2022-05-20 ENCOUNTER — Ambulatory Visit (INDEPENDENT_AMBULATORY_CARE_PROVIDER_SITE_OTHER): Payer: Medicare HMO | Admitting: Urology

## 2022-05-20 VITALS — BP 136/78 | HR 83 | Ht 75.0 in | Wt 234.0 lb

## 2022-05-20 DIAGNOSIS — N390 Urinary tract infection, site not specified: Secondary | ICD-10-CM

## 2022-05-20 DIAGNOSIS — R319 Hematuria, unspecified: Secondary | ICD-10-CM | POA: Diagnosis not present

## 2022-05-20 DIAGNOSIS — Z8546 Personal history of malignant neoplasm of prostate: Secondary | ICD-10-CM | POA: Diagnosis not present

## 2022-05-20 DIAGNOSIS — Z87440 Personal history of urinary (tract) infections: Secondary | ICD-10-CM | POA: Diagnosis not present

## 2022-05-20 DIAGNOSIS — C61 Malignant neoplasm of prostate: Secondary | ICD-10-CM

## 2022-05-20 LAB — URINALYSIS, COMPLETE
Bilirubin, UA: NEGATIVE
Glucose, UA: NEGATIVE
Ketones, UA: NEGATIVE
Leukocytes,UA: NEGATIVE
Nitrite, UA: NEGATIVE
Protein,UA: NEGATIVE
RBC, UA: NEGATIVE
Specific Gravity, UA: 1.025 (ref 1.005–1.030)
Urobilinogen, Ur: 2 mg/dL — ABNORMAL HIGH (ref 0.2–1.0)
pH, UA: 6 (ref 5.0–7.5)

## 2022-05-20 LAB — MICROSCOPIC EXAMINATION: Bacteria, UA: NONE SEEN

## 2022-05-21 LAB — PSA: Prostate Specific Ag, Serum: 18.1 ng/mL — ABNORMAL HIGH (ref 0.0–4.0)

## 2022-05-25 ENCOUNTER — Telehealth: Payer: Self-pay

## 2022-05-25 NOTE — Telephone Encounter (Signed)
Attempted pt on all numbers listed in the chart, was told I have the wrong number on those and number listed on DPR is not a working number.

## 2022-05-25 NOTE — Telephone Encounter (Signed)
-----   Message from Nori Riis, PA-C sent at 05/25/2022  9:10 AM EDT ----- Would you let Mr. Molina know that his PSA was elevated, but it may be secondary from inflammation from his recent urinary tract infection.  I would like to have it repeated again in 1 month.

## 2022-07-05 IMAGING — CT CT CHEST LUNG CANCER SCREENING LOW DOSE W/O CM
2 of 5 series · 15 of 40 positions shown, 18 images · non-contrast
Comparison: None.

CLINICAL DATA: 64-year-old male with 50 pack-year history of
smoking. Lung cancer screening.

EXAM:
CT CHEST WITHOUT CONTRAST LOW-DOSE FOR LUNG CANCER SCREENING
TECHNIQUE: Multidetector CT imaging of the chest was performed following the
standard protocol without IV contrast.

[Series 3: lung 1.00 · axial · 0.69mm/px · z∈[-1188,-872]mm · 12 of 348 slices shown, 15 images]
[im 16/348  mediastinal]
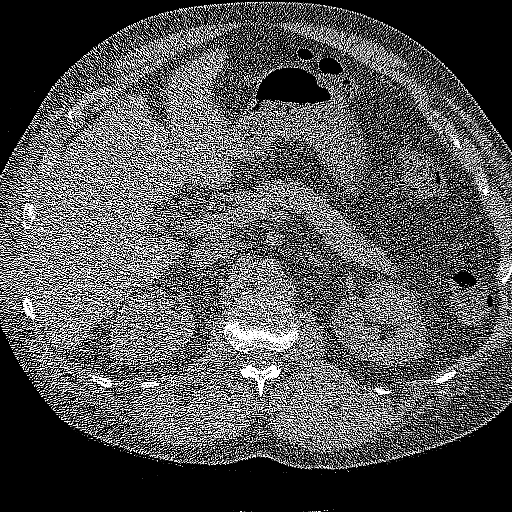
[im 16/348  lung]
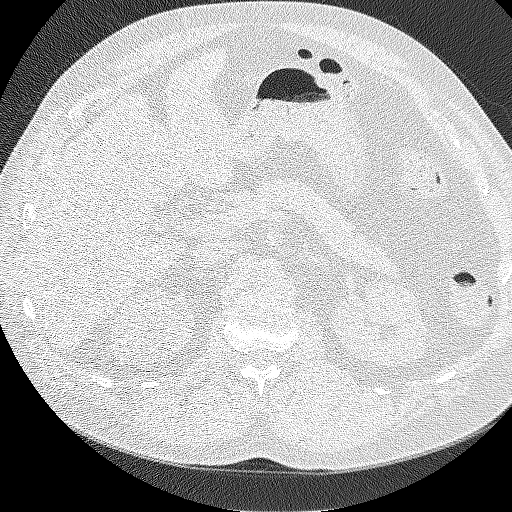
[im 48/348  lung]
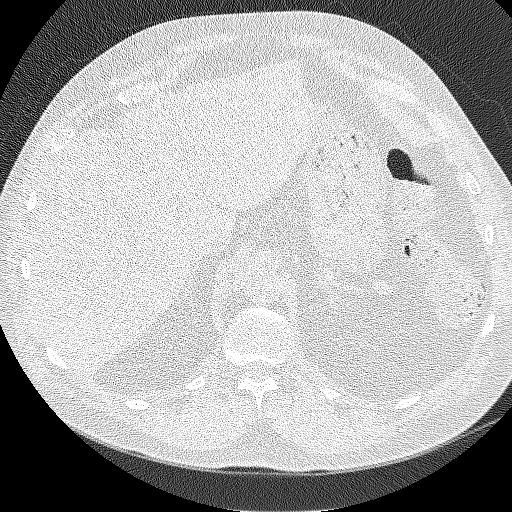
[im 79/348  lung]
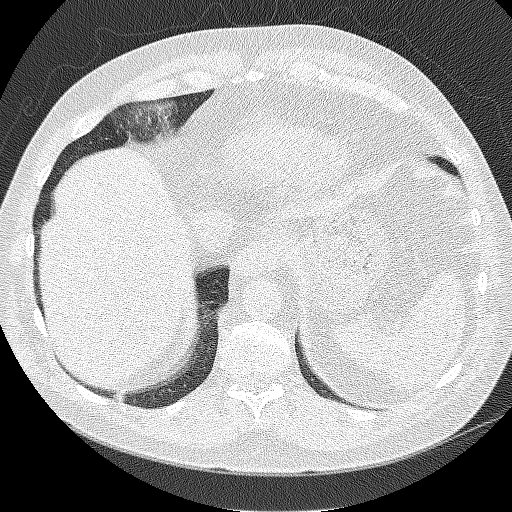
[im 111/348  lung]
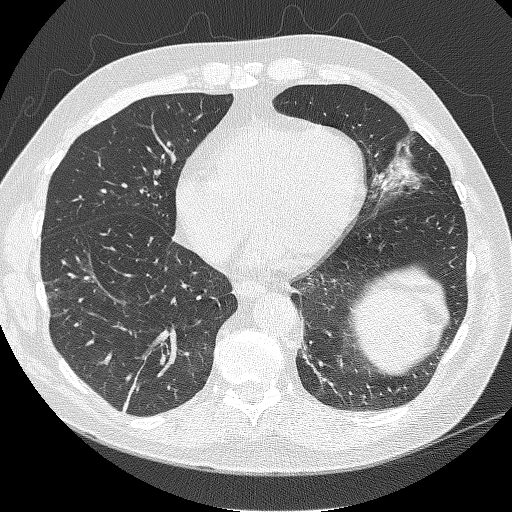
[im 127/348  mediastinal]
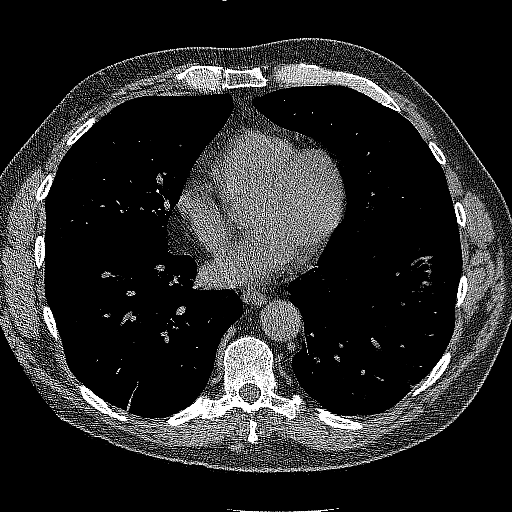
[im 127/348  lung]
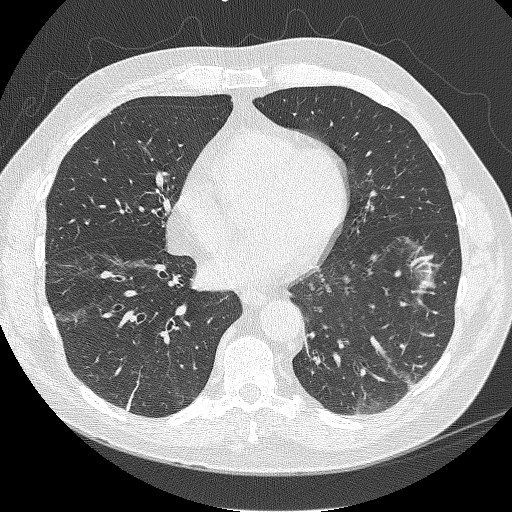
[im 158/348  lung]
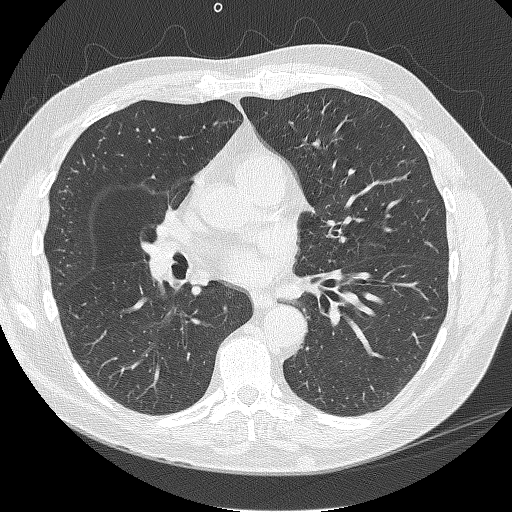
[im 190/348  lung]
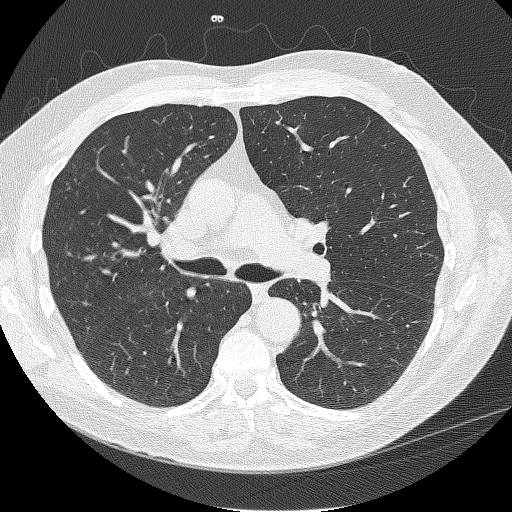
[im 221/348  lung]
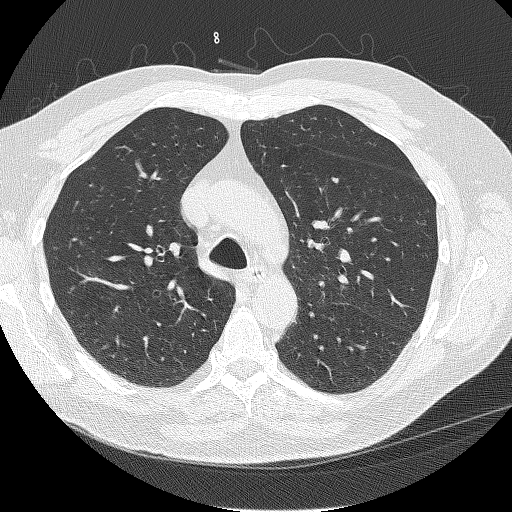
[im 237/348  mediastinal]
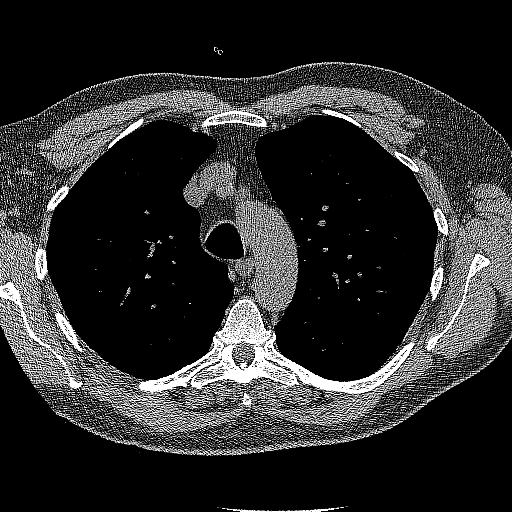
[im 237/348  lung]
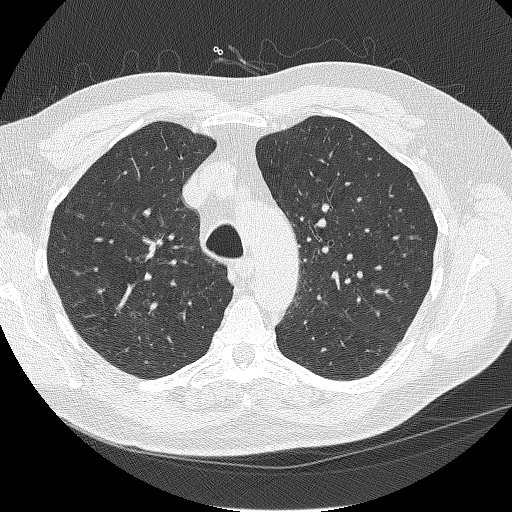
[im 269/348  lung]
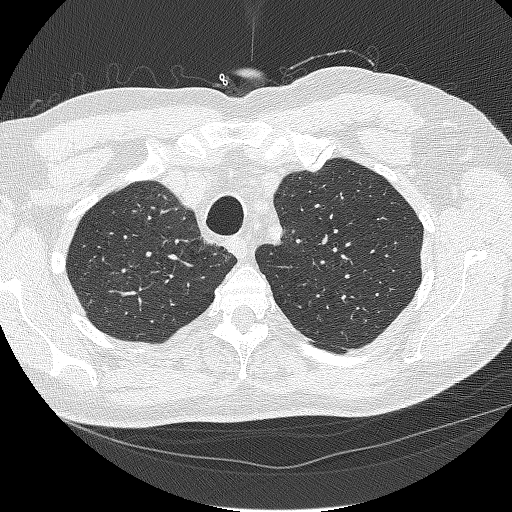
[im 300/348  lung]
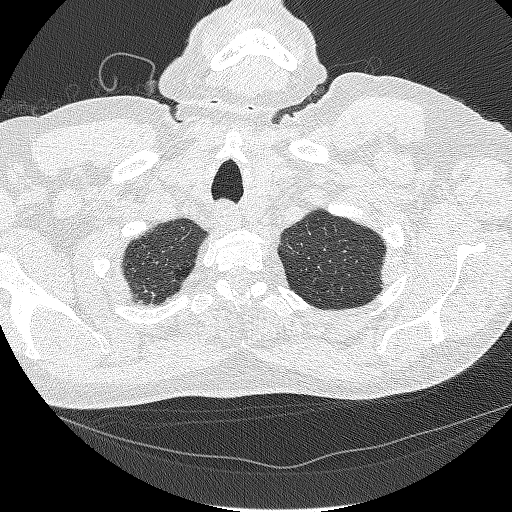
[im 332/348  lung]
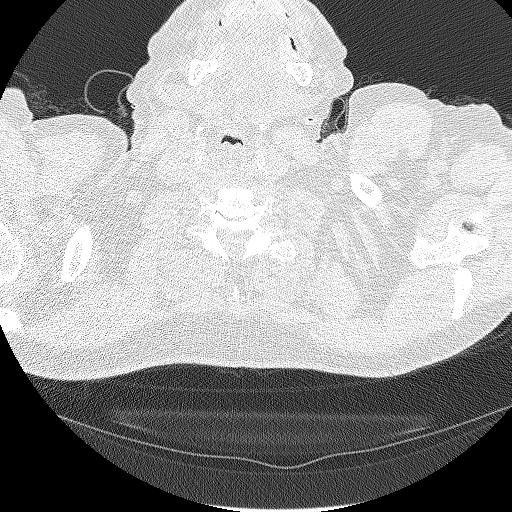

[Series 4: coronals lung 1.00 cor · coronal · 0.68mm/px · 3 of 298 slices shown]
[im 60/298  lung]
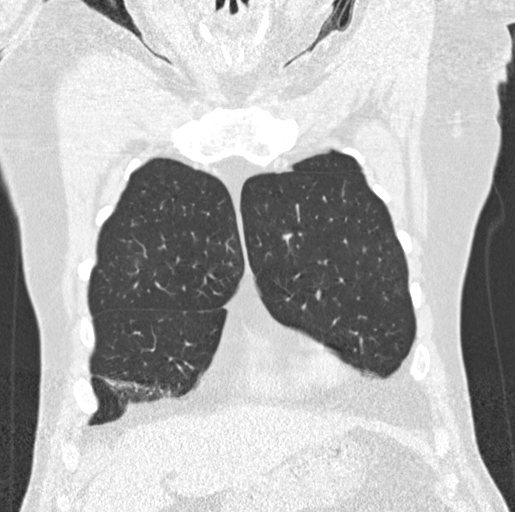
[im 119/298  lung]
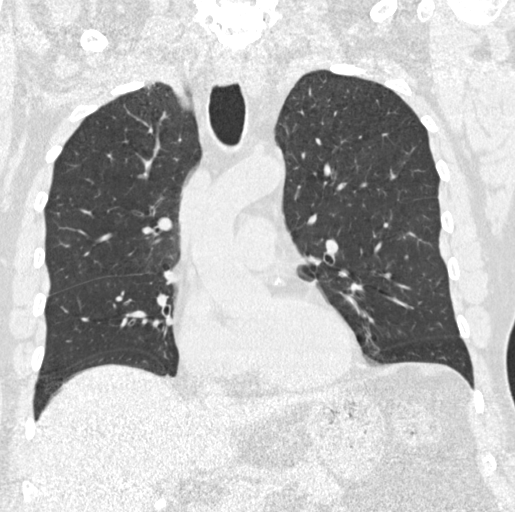
[im 179/298  lung]
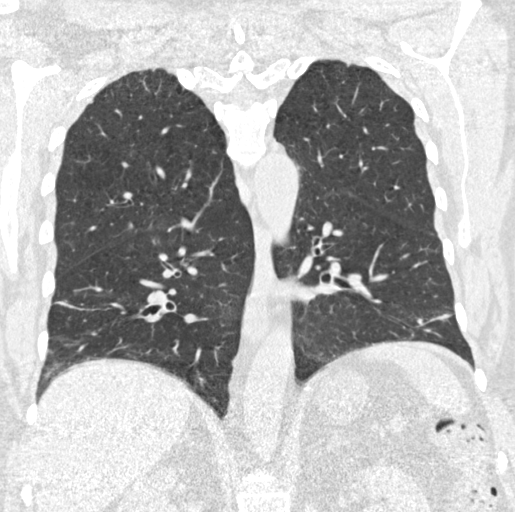

[15 of 40 positions shown; findings below may reference images not displayed]

FINDINGS: Cardiovascular: The heart size is normal. No substantial pericardial
effusion. Coronary artery calcification is evident.

Mediastinum/Nodes: No mediastinal lymphadenopathy. No evidence for
gross hilar lymphadenopathy although assessment is limited by the
lack of intravenous contrast on today's study. The esophagus has
normal imaging features. There is no axillary lymphadenopathy.

Lungs/Pleura: Centrilobular and paraseptal emphysema evident. 5 mm
posterior right lower lobe pulmonary nodule evident. Platelike areas
of atelectasis or scarring are noted in both lung bases progressive
since CT angio chest 05/27/2017. No overtly suspicious nodule or
mass. No pleural effusion.

Upper Abdomen: Calcified gallstones evident.

Musculoskeletal: No worrisome lytic or sclerotic osseous
abnormality. Probable sebaceous cyst right paramidline back.
IMPRESSION: 1. Lung-RADS 2, benign appearance or behavior. Continue annual
screening with low-dose chest CT without contrast in 12 months.
2. Cholelithiasis.
3. Emphysema ([id number].5).

## 2022-08-29 NOTE — Progress Notes (Unsigned)
08/30/2022 1:47 PM   Ernest [name] 1956/02/16 [id number]  Referring provider: Tracie Harrier, MD [address] [facility] [place],  [place] [address]  Urological history: 1.  Prostate cancer -PSA pending -Patient was followed by Dr. Bernardo Heater in 2019 for surveillance regarding prostate cancer. 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  2. Nephrolithiasis -CT renal stone study 2019 - Small nonobstructive right renal calculus is noted  3. Undescended testicle  -remote history of orchiopexy   4. ED -contributing factors of age, prostate cancer, COPD, diabetes, BPH, spinal stenosis and smoking.   HPI: Ernest [name] is a 66 y.o. male who presents today for follow up for pain with urination and frequency.    He was last seen in June and his PSA was found to be elevated at 18.  He had a positive urine culture a month prior to the PSA being drawn, so he asked to repeat it in 1 month, but we were unable to reach him by phone or mail.  At his appointment with me in June, he did share with me that he and his wife were currently in a situation where they were homeless and were living in hotel room to hotel room.  He shares today that he is about to be removed from his lodging this Friday.    He states he has been having straining, hesitancy and a deep ache after urinating for the last several months.  Patient denies any modifying or aggravating factors.  Patient denies any gross hematuria, dysuria or suprapubic/flank pain.  Patient denies any fevers, chills, nausea or vomiting.    UA negative   PVR 38 mL   KUB no stones seen.   PMH: Past Medical History:  Diagnosis Date   Acute on chronic respiratory failure with hypoxia ([place]) 05/25/2017   Acute respiratory failure with hypoxia ([place]) 05/25/2017   COPD (chronic obstructive pulmonary  disease) (HCC)    COPD with acute bronchitis ([place]) 05/25/2017   COPD with acute exacerbation ([place]) 05/25/2017   Diabetes mellitus without complication (HCC)    Leukocytosis 05/25/2017   Prostate cancer [facility])     Surgical History: Past Surgical History:  Procedure Laterality Date   TESTICLE SURGERY     undecended testicle repair    Home Medications:  Allergies as of 08/30/2022       Reactions   Morphine And Related Itching        Medication List        Accurate as of August 30, 2022  1:47 PM. If you have any questions, ask your nurse or doctor.          STOP taking these medications    sulfamethoxazole-trimethoprim 800-160 MG tablet Commonly known as: BACTRIM DS       TAKE these medications    albuterol 108 (90 Base) MCG/ACT inhaler Commonly known as: VENTOLIN HFA Inhale 2 puffs into the lungs every 4 (four) hours as needed for wheezing or shortness of breath.   budesonide 0.5 MG/2ML nebulizer solution Commonly known as: PULMICORT Take 0.5 mg by nebulization 2 (two) times daily.   budesonide-formoterol 160-4.5 MCG/ACT inhaler Commonly known as: Symbicort Inhale 2 puffs into the lungs 2 (two) times daily.   finasteride 5 MG tablet Commonly known as: PROSCAR Take 5 mg by mouth daily.   gabapentin 300 MG capsule Commonly known as: Neurontin Take 1  capsule (300 mg total) by mouth 2 (two) times daily.   ipratropium-albuterol 0.5-2.5 (3) MG/3ML Soln Commonly known as: DUONEB Take 3 mLs by nebulization 4 (four) times daily.   meloxicam 15 MG tablet Commonly known as: MOBIC Take 15 mg by mouth daily.   sertraline 50 MG tablet Commonly known as: ZOLOFT Take 50 mg by mouth daily.   sertraline 100 MG tablet Commonly known as: ZOLOFT Take 100 mg by mouth daily.   sildenafil 20 MG tablet Commonly known as: REVATIO Take 20 mg by mouth daily as needed.        Allergies:  Allergies  Allergen Reactions   Morphine And Related Itching    Family  History: Family History  Problem Relation Age of Onset   Stroke Mother    Breast cancer Mother    Bladder Cancer Neg Hx    Kidney cancer Neg Hx    Prostate cancer Neg Hx     Social History:  reports that he has been smoking cigarettes. He has a 50.00 pack-year smoking history. He has never used smokeless tobacco. He reports current alcohol use. He reports that he does not use drugs.  ROS: Pertinent ROS in HPI  Physical Exam: BP (!) 145/86   Pulse 96   Ht '6\' 3"'$  (1.905 m)   Wt 230 lb (104.3 kg)   BMI 28.75 kg/m   Constitutional:  Well nourished. Alert and oriented, No acute distress. HEENT: [place] AT, moist mucus membranes.  Trachea midline Cardiovascular: No clubbing, cyanosis, or edema. Respiratory: Normal respiratory effort, no increased work of breathing. Neurologic: Grossly intact, no focal deficits, moving all 4 extremities. Psychiatric: Normal mood and affect.   Laboratory Data:  Urinalysis See Epic and HPI I have reviewed the labs.   Pertinent Imaging:  08/30/22 13:33  Scan Result 14m  KUB no stone seen  I have independently reviewed the films.  See HPI.  Radiologist interpretation still pending.  Assessment & Plan:    1. Painful urination  -UA benign -urine culture -KUB negative for stone -We will schedule cystoscopy with Dr. SBernardo Heaterfor further evaluation of his symptoms as he is a smoker and at high risk for bladder cancer I have explained to the patient that they will  be scheduled for a cystoscopy in our office to evaluate their bladder.  The cystoscopy consists of passing a tube with a lens up through their urethra and into their urinary bladder.   We will inject the urethra with a lidocaine gel prior to introducing the cystoscope to help with any discomfort during the procedure.   After the procedure, they might experience blood in the urine and discomfort with urination.  This will abate after the first few voids.  I have  encouraged the patient to increase  water intake  during this time.  Patient denies any allergies to lidocaine.    2. Prostate cancer -PSA pending  No follow-ups on file.  These notes generated with voice recognition software. I apologize for typographical errors.  [place] [place] Two[place]eville [place] [phone]

## 2022-08-30 ENCOUNTER — Ambulatory Visit
Admission: RE | Admit: 2022-08-30 | Discharge: 2022-08-30 | Disposition: A | Discharge status: Home / Self Care | Payer: Medicare HMO | Source: Ambulatory Visit | Attending: Urology | Admitting: Urology

## 2022-08-30 ENCOUNTER — Ambulatory Visit (INDEPENDENT_AMBULATORY_CARE_PROVIDER_SITE_OTHER): Payer: Medicare HMO | Admitting: Urology

## 2022-08-30 ENCOUNTER — Encounter: Payer: Self-pay | Admitting: Urology

## 2022-08-30 VITALS — BP 145/86 | HR 96 | Ht 75.0 in | Wt 230.0 lb

## 2022-08-30 DIAGNOSIS — R3989 Other symptoms and signs involving the genitourinary system: Secondary | ICD-10-CM | POA: Diagnosis not present

## 2022-08-30 DIAGNOSIS — C61 Malignant neoplasm of prostate: Secondary | ICD-10-CM | POA: Diagnosis not present

## 2022-08-30 DIAGNOSIS — R300 Dysuria: Secondary | ICD-10-CM

## 2022-08-30 DIAGNOSIS — R109 Unspecified abdominal pain: Secondary | ICD-10-CM

## 2022-08-30 LAB — MICROSCOPIC EXAMINATION: Bacteria, UA: NONE SEEN

## 2022-08-30 LAB — URINALYSIS, COMPLETE
Bilirubin, UA: NEGATIVE
Glucose, UA: NEGATIVE
Ketones, UA: NEGATIVE
Leukocytes,UA: NEGATIVE
Nitrite, UA: NEGATIVE
Protein,UA: NEGATIVE
RBC, UA: NEGATIVE
Specific Gravity, UA: 1.01 (ref 1.005–1.030)
Urobilinogen, Ur: 1 mg/dL (ref 0.2–1.0)
pH, UA: 6.5 (ref 5.0–7.5)

## 2022-08-30 LAB — BLADDER SCAN AMB NON-IMAGING

## 2022-08-30 NOTE — Patient Instructions (Signed)
[facility]'s mailing address is: [place], [place] [address]  ? [place]  ? phone: [phone]  ? fax: [phone]  ? [email or web address] [phone]

## 2022-08-31 ENCOUNTER — Telehealth: Payer: Self-pay

## 2022-08-31 LAB — PSA: Prostate Specific Ag, Serum: 28 ng/mL — ABNORMAL HIGH (ref 0.0–4.0)

## 2022-08-31 NOTE — Telephone Encounter (Signed)
LMOM for pt to return call. 

## 2022-08-31 NOTE — Telephone Encounter (Signed)
-----   Message from Nori Riis, PA-C sent at 08/31/2022 11:12 AM EDT ----- Please let Mr. Luckadoo know that his PSA has actually increased further to 28.0.  We need to get him scheduled for a CT scan and bone scan at this time.

## 2022-09-02 ENCOUNTER — Other Ambulatory Visit: Payer: Self-pay | Admitting: Urology

## 2022-09-02 DIAGNOSIS — C61 Malignant neoplasm of prostate: Secondary | ICD-10-CM

## 2022-09-02 DIAGNOSIS — R9721 Rising PSA following treatment for malignant neoplasm of prostate: Secondary | ICD-10-CM

## 2022-09-02 LAB — CULTURE, URINE COMPREHENSIVE

## 2022-09-02 NOTE — Progress Notes (Unsigned)
Ernest [name] is living hotel to hotel and his cell phone number is likely a friend's because it is not him or his name on the voice mail.  He has an appointment with Dr. Bernardo Heater on 09/16/2022.  I would like to have these tests scheduled, so we can give him the times and dates while he is here that day.

## 2022-09-12 ENCOUNTER — Ambulatory Visit: Payer: Medicare HMO

## 2022-09-15 ENCOUNTER — Ambulatory Visit: Payer: Medicare HMO | Admitting: Student in an Organized Health Care Education/Training Program

## 2022-09-16 ENCOUNTER — Other Ambulatory Visit: Payer: Medicare HMO | Admitting: Urology

## 2022-09-19 ENCOUNTER — Encounter: Payer: Self-pay | Admitting: Urology

## 2022-09-20 ENCOUNTER — Ambulatory Visit: Payer: Medicare HMO

## 2022-09-27 ENCOUNTER — Ambulatory Visit: Admission: RE | Admit: 2022-09-27 | Payer: Medicare HMO | Source: Ambulatory Visit

## 2022-09-30 ENCOUNTER — Telehealth: Payer: Self-pay

## 2022-09-30 NOTE — Telephone Encounter (Signed)
Carlis Stable with [facility] called triage line  at 215pm -- Pet Scan  scheduled for pt. She is unable to reach pt about his appt on Monday at 1130. No valid number in system.   Called both contacts -spouse number- went to vm with a different name.  Second contact - sister- went to VM to a business office.   Pt does not have my chart. Pt did n/s for his CT on 10/17.   I made a note on the appt desk if  he does show for his pet scan to update his phone and contacts.   Thanks.

## 2022-10-03 ENCOUNTER — Ambulatory Visit
Admission: RE | Admit: 2022-10-03 | Discharge: 2022-10-03 | Disposition: A | Discharge status: Home / Self Care | Payer: Medicare HMO | Source: Ambulatory Visit | Attending: Urology | Admitting: Urology

## 2022-10-03 DIAGNOSIS — C61 Malignant neoplasm of prostate: Secondary | ICD-10-CM

## 2022-10-03 DIAGNOSIS — R9721 Rising PSA following treatment for malignant neoplasm of prostate: Secondary | ICD-10-CM

## 2022-10-17 ENCOUNTER — Ambulatory Visit (HOSPITAL_COMMUNITY)
Admission: RE | Admit: 2022-10-17 | Discharge: 2022-10-17 | Disposition: A | Discharge status: Home / Self Care | Payer: Medicare HMO | Source: Ambulatory Visit | Attending: Urology | Admitting: Urology

## 2022-10-17 DIAGNOSIS — R9721 Rising PSA following treatment for malignant neoplasm of prostate: Secondary | ICD-10-CM | POA: Insufficient documentation

## 2022-10-17 DIAGNOSIS — C61 Malignant neoplasm of prostate: Secondary | ICD-10-CM | POA: Insufficient documentation

## 2022-10-17 MED ORDER — PIFLIFOLASTAT F 18 (PYLARIFY) INJECTION
9.0000 | Freq: Once | INTRAVENOUS | Status: AC
  Administered 2022-10-17: 9.6 via INTRAVENOUS

## 2022-12-14 ENCOUNTER — Telehealth: Payer: Self-pay | Admitting: *Deleted

## 2022-12-14 NOTE — Telephone Encounter (Signed)
Pt's last lung screening CT was 12/01/20. Pt had PET scan 10/17/22 that showed no suspicious lung nodules but was positive for Prostate cancer. Pt is currently receiving radiation. Note faxed to Dr Ginette Pitman to let them know that pt will not qualify for lung cancer screening while receiving treatment for Prostate cancer but we will revaluate this in 10/2023.

## 2023-01-13 ENCOUNTER — Encounter: Payer: Self-pay | Admitting: Pulmonary Disease

## 2023-01-13 ENCOUNTER — Ambulatory Visit: Payer: Medicare HMO | Admitting: Pulmonary Disease

## 2023-01-13 VITALS — BP 130/84 | HR 93 | Ht 75.0 in | Wt 236.2 lb

## 2023-01-13 DIAGNOSIS — J449 Chronic obstructive pulmonary disease, unspecified: Secondary | ICD-10-CM

## 2023-01-13 DIAGNOSIS — J431 Panlobular emphysema: Secondary | ICD-10-CM

## 2023-01-13 NOTE — Patient Instructions (Signed)
Check ambulatory oxygen levels  Start on continue Trelegy, to be used daily  Rescue inhaler use as needed up to 4 times a day  Graded exercises as tolerated to help you keep activity level as best as you can  Tentative follow-up in 3 months  If your breathing almost always goes into spasms whenever you take deep breaths, we may not be able to obtain a breathing study. Can consider this when things stabilize

## 2023-01-13 NOTE — Progress Notes (Signed)
Ernest [name]    [id number]    12/02/1956  Primary Care Physician:Hande, Cherlyn Labella, MD  Referring Physician: Roselee Nova, MD [name],   [id number]  Chief complaint:   Patient with known history of COPD  HPI:  COPD since 2000 and 03/2004  Just started on Trelegy  He does have oxygen supplementation but does not have it presently with him  He does have a cough, does have spasms with breathing He has no pulmonary function test on record because the last time he tried it about 2 years ago started coughing and choking  History of asthma, history of allergies/sinus trouble  Quit smoking about 20-21 days ago was 1 and 1/2 pack a day smoker  Has been hospitalized several times for his COPD and exacerbations  Was recently switched from Symbicort to Trelegy  Recent diagnosis of prostate cancer  Outpatient Encounter Medications as of 01/13/2023  Medication Sig   albuterol (VENTOLIN HFA) 108 (90 Base) MCG/ACT inhaler Inhale 2 puffs into the lungs every 4 (four) hours as needed for wheezing or shortness of breath.   budesonide (PULMICORT) 0.5 MG/2ML nebulizer solution Take 0.5 mg by nebulization 2 (two) times daily.   finasteride (PROSCAR) 5 MG tablet Take 5 mg by mouth daily.   ipratropium-albuterol (DUONEB) 0.5-2.5 (3) MG/3ML SOLN Take 3 mLs by nebulization 4 (four) times daily.   meloxicam (MOBIC) 15 MG tablet Take 15 mg by mouth daily.   sildenafil (REVATIO) 20 MG tablet Take 20 mg by mouth daily as needed.   TRELEGY ELLIPTA 100-62.5-25 MCG/ACT AEPB Inhale 1 puff into the lungs daily.   budesonide-formoterol (SYMBICORT) 160-4.5 MCG/ACT inhaler Inhale 2 puffs into the lungs 2 (two) times daily. (Patient not taking: Reported on 01/13/2023)   gabapentin (NEURONTIN) 300 MG capsule Take 1 capsule (300 mg total) by mouth 2 (two) times daily.   sertraline (ZOLOFT) 100 MG tablet Take 100 mg by mouth daily. (Patient not taking: Reported on 01/13/2023)    sertraline (ZOLOFT) 50 MG tablet Take 50 mg by mouth daily. (Patient not taking: Reported on 01/13/2023)   No facility-administered encounter medications on file as of 01/13/2023.    Allergies as of 01/13/2023 - Review Complete 01/13/2023  Allergen Reaction Noted   Morphine and related Itching 09/02/2020    Past Medical History:  Diagnosis Date   Acute on chronic respiratory failure with hypoxia ([place]) 05/25/2017   Acute respiratory failure with hypoxia ([facility]) 05/25/2017   COPD (chronic obstructive pulmonary disease) (HCC)    COPD with acute bronchitis ([place]) 05/25/2017   COPD with acute exacerbation ([place]) 05/25/2017   Diabetes mellitus without complication (HCC)    Leukocytosis 05/25/2017   Prostate cancer [facility])     Past Surgical History:  Procedure Laterality Date   TESTICLE SURGERY     undecended testicle repair    Family History  Problem Relation Age of Onset   Stroke Mother    Breast cancer Mother    Bladder Cancer Neg Hx    Kidney cancer Neg Hx    Prostate cancer Neg Hx     Social History   Socioeconomic History   Marital status: Married    Spouse name: Not on file   Number of children: Not on file   Years of education: Not on file   Highest education level: Not on file  Occupational History   Not on file  Tobacco Use   Smoking status: Former  Packs/day: 1.00    Years: 50.00    Total pack years: 50.00    Types: Cigarettes    Quit date: 12/24/2022    Years since quitting: 0.0   Smokeless tobacco: Never  Vaping Use   Vaping Use: Never used  Substance and Sexual Activity   Alcohol use: Yes    Comment: occasionally    Drug use: Never   Sexual activity: Not on file  Other Topics Concern   Not on file  Social History Narrative   Not on file   Social Determinants of Health   Financial Resource Strain: Not on file  Food Insecurity: Not on file  Transportation Needs: Not on file  Physical Activity: Not on file  Stress: Not on file  Social Connections: Not  on file  Intimate Partner Violence: Not on file    Review of Systems  Respiratory:  Positive for cough and shortness of breath.     Vitals:   01/13/23 1436  BP: 130/84  Pulse: 93  SpO2: 98%     Physical Exam Constitutional:      Appearance: Normal appearance.  HENT:     Head: Normocephalic.     Mouth/Throat:     Mouth: Mucous membranes are moist.  Eyes:     General: No scleral icterus. Cardiovascular:     Rate and Rhythm: Normal rate and regular rhythm.     Heart sounds: No murmur heard.    No friction rub.  Pulmonary:     Effort: No respiratory distress.     Breath sounds: No stridor. No wheezing or rhonchi.  Musculoskeletal:     Cervical back: No rigidity or tenderness.  Neurological:     Mental Status: He is alert.  Psychiatric:        Mood and Affect: Mood normal.    Data Reviewed: Recent PET scan reviewed  Low-dose CT scan from 2021 reviewed showing evidence of emphysema  Assessment:   Chronic obstructive pulmonary disease -COPD stage unknown as he has not been able to have a PFT performed  History of chronic respiratory failure however oxygen levels today was 98% on room air at rest  Recent diagnosis of prostate cancer  Recently quit smoking    Plan/Recommendations: Continue Trelegy  Graded exercise as tolerated  Rescue inhaler use as needed  Will attempt pulmonary function test when more stable  Exercise oximetry when more stable     Sherrilyn Rist MD [facility] 01/13/2023, 2:51 PM  CC: Roselee Nova, MD
